# Patient Record
Sex: Male | Born: 1935 | Race: White | Hispanic: No | Smoking: Never smoker
Health system: Southern US, Community
[De-identification: ages and names within clinical notes are randomized; demographics above are authoritative.]

## PROBLEM LIST (undated history)

## (undated) DIAGNOSIS — N200 Calculus of kidney: Secondary | ICD-10-CM

## (undated) DIAGNOSIS — M8580 Other specified disorders of bone density and structure, unspecified site: Secondary | ICD-10-CM

## (undated) DIAGNOSIS — I951 Orthostatic hypotension: Secondary | ICD-10-CM

## (undated) DIAGNOSIS — I739 Peripheral vascular disease, unspecified: Secondary | ICD-10-CM

## (undated) DIAGNOSIS — J309 Allergic rhinitis, unspecified: Secondary | ICD-10-CM

## (undated) DIAGNOSIS — K649 Unspecified hemorrhoids: Secondary | ICD-10-CM

## (undated) DIAGNOSIS — K219 Gastro-esophageal reflux disease without esophagitis: Secondary | ICD-10-CM

## (undated) DIAGNOSIS — M1990 Unspecified osteoarthritis, unspecified site: Secondary | ICD-10-CM

## (undated) DIAGNOSIS — N529 Male erectile dysfunction, unspecified: Secondary | ICD-10-CM

## (undated) DIAGNOSIS — N139 Obstructive and reflux uropathy, unspecified: Secondary | ICD-10-CM

## (undated) DIAGNOSIS — K635 Polyp of colon: Secondary | ICD-10-CM

## (undated) DIAGNOSIS — F419 Anxiety disorder, unspecified: Secondary | ICD-10-CM

## (undated) DIAGNOSIS — K589 Irritable bowel syndrome without diarrhea: Secondary | ICD-10-CM

## (undated) DIAGNOSIS — E785 Hyperlipidemia, unspecified: Secondary | ICD-10-CM

## (undated) HISTORY — DX: Unspecified osteoarthritis, unspecified site: M19.90

## (undated) HISTORY — DX: Gastro-esophageal reflux disease without esophagitis: K21.9

## (undated) HISTORY — DX: Other specified disorders of bone density and structure, unspecified site: M85.80

## (undated) HISTORY — DX: Unspecified hemorrhoids: K64.9

## (undated) HISTORY — DX: Peripheral vascular disease, unspecified: I73.9

## (undated) HISTORY — DX: Irritable bowel syndrome, unspecified: K58.9

## (undated) HISTORY — DX: Orthostatic hypotension: I95.1

## (undated) HISTORY — DX: Anxiety disorder, unspecified: F41.9

## (undated) HISTORY — DX: Male erectile dysfunction, unspecified: N52.9

## (undated) HISTORY — DX: Hyperlipidemia, unspecified: E78.5

## (undated) HISTORY — DX: Calculus of kidney: N20.0

## (undated) HISTORY — DX: Obstructive and reflux uropathy, unspecified: N13.9

## (undated) HISTORY — DX: Polyp of colon: K63.5

## (undated) HISTORY — DX: Allergic rhinitis, unspecified: J30.9

---

## 2001-11-06 ENCOUNTER — Encounter: Admission: RE | Admit: 2001-11-06 | Discharge: 2001-11-06 | Payer: Self-pay | Admitting: Cardiology

## 2001-11-06 ENCOUNTER — Encounter: Payer: Self-pay | Admitting: Cardiology

## 2001-11-13 ENCOUNTER — Ambulatory Visit (HOSPITAL_COMMUNITY): Admission: RE | Admit: 2001-11-13 | Discharge: 2001-11-13 | Payer: Self-pay | Admitting: Cardiology

## 2003-11-09 HISTORY — PX: HERNIA REPAIR: SHX51

## 2004-10-02 ENCOUNTER — Ambulatory Visit: Payer: Self-pay | Admitting: Family Medicine

## 2004-11-04 ENCOUNTER — Ambulatory Visit: Payer: Self-pay | Admitting: Family Medicine

## 2004-12-03 ENCOUNTER — Ambulatory Visit: Payer: Self-pay | Admitting: Family Medicine

## 2005-09-11 ENCOUNTER — Ambulatory Visit: Payer: Self-pay | Admitting: Family Medicine

## 2005-11-08 HISTORY — PX: COLONOSCOPY: SHX174

## 2005-12-20 ENCOUNTER — Ambulatory Visit: Payer: Self-pay | Admitting: Internal Medicine

## 2006-02-24 ENCOUNTER — Ambulatory Visit: Payer: Self-pay | Admitting: Internal Medicine

## 2006-03-15 ENCOUNTER — Ambulatory Visit: Payer: Self-pay | Admitting: Family Medicine

## 2006-06-16 ENCOUNTER — Ambulatory Visit: Payer: Self-pay | Admitting: Family Medicine

## 2006-09-06 ENCOUNTER — Ambulatory Visit: Payer: Self-pay | Admitting: Family Medicine

## 2006-10-13 ENCOUNTER — Ambulatory Visit: Payer: Self-pay | Admitting: Family Medicine

## 2007-02-09 ENCOUNTER — Ambulatory Visit: Payer: Self-pay | Admitting: Family Medicine

## 2007-03-14 ENCOUNTER — Ambulatory Visit: Payer: Self-pay | Admitting: Family Medicine

## 2007-03-21 ENCOUNTER — Ambulatory Visit: Payer: Self-pay | Admitting: Family Medicine

## 2007-03-21 LAB — CONVERTED CEMR LAB
Basophils Absolute: 0 10*3/uL (ref 0.0–0.1)
Basophils Relative: 0.7 % (ref 0.0–1.0)
CO2: 30 meq/L (ref 19–32)
Creatinine, Ser: 0.6 mg/dL (ref 0.4–1.5)
Direct LDL: 144.4 mg/dL
HCT: 41.9 % (ref 39.0–52.0)
Hemoglobin: 14.6 g/dL (ref 13.0–17.0)
MCHC: 34.9 g/dL (ref 30.0–36.0)
Monocytes Absolute: 0.5 10*3/uL (ref 0.2–0.7)
Neutrophils Relative %: 53.5 % (ref 43.0–77.0)
PSA: 0.47 ng/mL (ref 0.10–4.00)
Potassium: 4.6 meq/L (ref 3.5–5.1)
RDW: 12 % (ref 11.5–14.6)
Sodium: 143 meq/L (ref 135–145)
Total Bilirubin: 0.8 mg/dL (ref 0.3–1.2)
Total CHOL/HDL Ratio: 4.6
Total Protein: 6.5 g/dL (ref 6.0–8.3)

## 2007-06-27 ENCOUNTER — Ambulatory Visit: Payer: Self-pay | Admitting: Family Medicine

## 2007-06-28 LAB — CONVERTED CEMR LAB
ALT: 32 units/L (ref 0–53)
AST: 31 units/L (ref 0–37)
Albumin: 3.7 g/dL (ref 3.5–5.2)
VLDL: 14 mg/dL (ref 0–40)

## 2007-08-27 ENCOUNTER — Encounter: Payer: Self-pay | Admitting: Family Medicine

## 2007-09-21 ENCOUNTER — Ambulatory Visit: Payer: Self-pay | Admitting: Family Medicine

## 2007-09-21 DIAGNOSIS — Z87442 Personal history of urinary calculi: Secondary | ICD-10-CM | POA: Insufficient documentation

## 2007-09-21 DIAGNOSIS — E785 Hyperlipidemia, unspecified: Secondary | ICD-10-CM | POA: Insufficient documentation

## 2007-10-12 ENCOUNTER — Ambulatory Visit: Payer: Self-pay | Admitting: Family Medicine

## 2007-11-14 ENCOUNTER — Ambulatory Visit: Payer: Self-pay | Admitting: Family Medicine

## 2007-11-27 ENCOUNTER — Ambulatory Visit: Payer: Self-pay | Admitting: Family Medicine

## 2007-11-30 ENCOUNTER — Telehealth: Payer: Self-pay | Admitting: Family Medicine

## 2007-11-30 LAB — CONVERTED CEMR LAB
Total CHOL/HDL Ratio: 3
Triglycerides: 68 mg/dL (ref 0–149)
VLDL: 14 mg/dL (ref 0–40)

## 2007-12-05 ENCOUNTER — Encounter: Payer: Self-pay | Admitting: Family Medicine

## 2008-01-17 ENCOUNTER — Ambulatory Visit: Payer: Self-pay | Admitting: Family Medicine

## 2008-01-17 DIAGNOSIS — M899 Disorder of bone, unspecified: Secondary | ICD-10-CM | POA: Insufficient documentation

## 2008-01-17 DIAGNOSIS — M949 Disorder of cartilage, unspecified: Secondary | ICD-10-CM

## 2008-02-01 LAB — CONVERTED CEMR LAB
ALT: 24 units/L (ref 0–53)
Alkaline Phosphatase: 61 units/L (ref 39–117)
Cholesterol: 138 mg/dL (ref 0–200)
LDL Cholesterol: 71 mg/dL (ref 0–99)
Total Protein: 6.3 g/dL (ref 6.0–8.3)

## 2008-02-05 LAB — CONVERTED CEMR LAB: Vit D, 1,25-Dihydroxy: 38 (ref 30–89)

## 2008-05-15 ENCOUNTER — Ambulatory Visit: Payer: Self-pay | Admitting: Family Medicine

## 2008-05-15 DIAGNOSIS — M1610 Unilateral primary osteoarthritis, unspecified hip: Secondary | ICD-10-CM | POA: Insufficient documentation

## 2008-06-13 ENCOUNTER — Ambulatory Visit: Payer: Self-pay | Admitting: Family Medicine

## 2008-06-13 DIAGNOSIS — M67919 Unspecified disorder of synovium and tendon, unspecified shoulder: Secondary | ICD-10-CM | POA: Insufficient documentation

## 2008-06-13 DIAGNOSIS — M719 Bursopathy, unspecified: Secondary | ICD-10-CM

## 2008-06-14 ENCOUNTER — Telehealth: Payer: Self-pay | Admitting: *Deleted

## 2008-07-24 ENCOUNTER — Telehealth: Payer: Self-pay | Admitting: Family Medicine

## 2008-07-24 ENCOUNTER — Ambulatory Visit: Payer: Self-pay | Admitting: Family Medicine

## 2009-04-01 ENCOUNTER — Telehealth: Payer: Self-pay | Admitting: Family Medicine

## 2009-04-22 ENCOUNTER — Ambulatory Visit: Payer: Self-pay | Admitting: Family Medicine

## 2009-04-22 ENCOUNTER — Telehealth: Payer: Self-pay | Admitting: Family Medicine

## 2009-04-22 DIAGNOSIS — R5381 Other malaise: Secondary | ICD-10-CM | POA: Insufficient documentation

## 2009-04-22 DIAGNOSIS — K219 Gastro-esophageal reflux disease without esophagitis: Secondary | ICD-10-CM | POA: Insufficient documentation

## 2009-04-22 DIAGNOSIS — R5383 Other fatigue: Secondary | ICD-10-CM

## 2009-04-22 LAB — CONVERTED CEMR LAB
Basophils Absolute: 0 10*3/uL (ref 0.0–0.1)
Basophils Relative: 0.4 % (ref 0.0–3.0)
CO2: 29 meq/L (ref 19–32)
Calcium: 9.3 mg/dL (ref 8.4–10.5)
Chloride: 106 meq/L (ref 96–112)
Creatinine, Ser: 0.9 mg/dL (ref 0.4–1.5)
Eosinophils Relative: 2.1 % (ref 0.0–5.0)
Glucose, Bld: 79 mg/dL (ref 70–99)
HCT: 40.7 % (ref 39.0–52.0)
Hemoglobin: 14.5 g/dL (ref 13.0–17.0)
LDL Cholesterol: 77 mg/dL (ref 0–99)
Lymphocytes Relative: 33.6 % (ref 12.0–46.0)
Lymphs Abs: 1.6 10*3/uL (ref 0.7–4.0)
Monocytes Relative: 10.2 % (ref 3.0–12.0)
Neutro Abs: 2.6 10*3/uL (ref 1.4–7.7)
RBC: 4.18 M/uL — ABNORMAL LOW (ref 4.22–5.81)
RDW: 12.4 % (ref 11.5–14.6)
TSH: 1.62 microintl units/mL (ref 0.35–5.50)
Total CHOL/HDL Ratio: 3
Triglycerides: 91 mg/dL (ref 0.0–149.0)
WBC: 4.8 10*3/uL (ref 4.5–10.5)

## 2009-05-21 ENCOUNTER — Ambulatory Visit: Payer: Self-pay | Admitting: Family Medicine

## 2009-05-21 ENCOUNTER — Telehealth: Payer: Self-pay | Admitting: Family Medicine

## 2009-08-05 ENCOUNTER — Encounter: Payer: Self-pay | Admitting: Family Medicine

## 2009-08-20 ENCOUNTER — Ambulatory Visit: Payer: Self-pay | Admitting: Family Medicine

## 2009-09-10 ENCOUNTER — Ambulatory Visit: Payer: Self-pay | Admitting: Family Medicine

## 2009-09-10 DIAGNOSIS — J309 Allergic rhinitis, unspecified: Secondary | ICD-10-CM | POA: Insufficient documentation

## 2009-09-10 DIAGNOSIS — R42 Dizziness and giddiness: Secondary | ICD-10-CM | POA: Insufficient documentation

## 2009-09-10 DIAGNOSIS — I739 Peripheral vascular disease, unspecified: Secondary | ICD-10-CM | POA: Insufficient documentation

## 2010-03-05 ENCOUNTER — Ambulatory Visit: Payer: Self-pay | Admitting: Family Medicine

## 2010-04-10 ENCOUNTER — Inpatient Hospital Stay (HOSPITAL_COMMUNITY): Admission: EM | Admit: 2010-04-10 | Discharge: 2010-04-11 | Payer: Self-pay | Admitting: Emergency Medicine

## 2010-04-15 ENCOUNTER — Ambulatory Visit: Payer: Self-pay | Admitting: Family Medicine

## 2010-04-15 DIAGNOSIS — T148XXA Other injury of unspecified body region, initial encounter: Secondary | ICD-10-CM | POA: Insufficient documentation

## 2010-04-15 DIAGNOSIS — K589 Irritable bowel syndrome without diarrhea: Secondary | ICD-10-CM | POA: Insufficient documentation

## 2010-04-15 DIAGNOSIS — R079 Chest pain, unspecified: Secondary | ICD-10-CM | POA: Insufficient documentation

## 2010-04-20 ENCOUNTER — Ambulatory Visit: Payer: Self-pay | Admitting: Family Medicine

## 2010-05-01 ENCOUNTER — Encounter: Payer: Self-pay | Admitting: Family Medicine

## 2010-05-17 LAB — CONVERTED CEMR LAB
OCCULT 1: NEGATIVE
OCCULT 3: NEGATIVE

## 2010-06-04 ENCOUNTER — Ambulatory Visit: Payer: Self-pay | Admitting: Family Medicine

## 2010-06-04 DIAGNOSIS — I951 Orthostatic hypotension: Secondary | ICD-10-CM | POA: Insufficient documentation

## 2010-06-04 DIAGNOSIS — F411 Generalized anxiety disorder: Secondary | ICD-10-CM | POA: Insufficient documentation

## 2010-06-18 ENCOUNTER — Ambulatory Visit: Payer: Self-pay | Admitting: Family Medicine

## 2010-06-18 DIAGNOSIS — W57XXXA Bitten or stung by nonvenomous insect and other nonvenomous arthropods, initial encounter: Secondary | ICD-10-CM

## 2010-06-18 DIAGNOSIS — T148 Other injury of unspecified body region: Secondary | ICD-10-CM | POA: Insufficient documentation

## 2010-06-18 DIAGNOSIS — R609 Edema, unspecified: Secondary | ICD-10-CM | POA: Insufficient documentation

## 2010-07-20 ENCOUNTER — Telehealth: Payer: Self-pay | Admitting: Family Medicine

## 2010-07-28 ENCOUNTER — Telehealth: Payer: Self-pay | Admitting: Family Medicine

## 2010-08-04 ENCOUNTER — Ambulatory Visit: Payer: Self-pay | Admitting: Family Medicine

## 2010-09-28 ENCOUNTER — Ambulatory Visit: Payer: Self-pay | Admitting: Family Medicine

## 2010-10-05 ENCOUNTER — Ambulatory Visit: Payer: Self-pay | Admitting: Family Medicine

## 2010-10-05 DIAGNOSIS — L570 Actinic keratosis: Secondary | ICD-10-CM | POA: Insufficient documentation

## 2010-10-13 LAB — CONVERTED CEMR LAB
ALT: 20 units/L (ref 0–53)
AST: 27 units/L (ref 0–37)
Albumin: 3.9 g/dL (ref 3.5–5.2)
Alkaline Phosphatase: 61 units/L (ref 39–117)
BUN: 15 mg/dL (ref 6–23)
Bilirubin, Direct: 0.2 mg/dL (ref 0.0–0.3)
CO2: 27 meq/L (ref 19–32)
Calcium: 9 mg/dL (ref 8.4–10.5)
Chloride: 104 meq/L (ref 96–112)
Cholesterol: 235 mg/dL — ABNORMAL HIGH (ref 0–200)
Creatinine, Ser: 0.8 mg/dL (ref 0.4–1.5)
Direct LDL: 150.4 mg/dL
GFR calc non Af Amer: 100.36 mL/min (ref >60)
Glucose, Bld: 92 mg/dL (ref 70–99)
HDL: 58.4 mg/dL (ref >39.00)
Potassium: 4.1 meq/L (ref 3.5–5.1)
Sodium: 141 meq/L (ref 135–145)
TSH: 1.25 microintl units/mL (ref 0.35–5.50)
Total Bilirubin: 0.8 mg/dL (ref 0.3–1.2)
Total CHOL/HDL Ratio: 4
Total Protein: 6.4 g/dL (ref 6.0–8.3)
Triglycerides: 68 mg/dL (ref 0.0–149.0)
VLDL: 13.6 mg/dL (ref 0.0–40.0)

## 2010-12-02 ENCOUNTER — Encounter: Payer: Self-pay | Admitting: Family Medicine

## 2010-12-10 NOTE — Assessment & Plan Note (Signed)
Summary: sneezing/not feeling like himself/cjr   Vital Signs:  Patient profile:   77 year old male Weight:      179 pounds O2 Sat:      97 % Temp:     97.6 degrees F Pulse rate:   76 / minute BP sitting:   126 / 84  (left arm)  Vitals Entered By: Pura Spice, RN (March 05, 2010 4:12 PM) CC: sneezes and coughs    History of Present Illness: This 77 year old white married male had been doing very well except for the past 4-6 weeks he is at increased sneezing with some cough which sounds more like coming from the upper chest or throat then the lungs per side there is head and nasal congestion with clear drainage no fever  blood pressure under good control no recent problem with renal calculi Kenyon Ana under good control  Allergies: No Known Drug Allergies  Past History:  Past Medical History: Last updated: 06/30/2007 Kidney stones  Social History: Last updated: 06/30/2007 Never Smoked Alcohol use-yes Drug use-no Regular exercise-no  Risk Factors: Smoking Status: never (06/30/2007)  Review of Systems      See HPI  The patient denies anorexia, fever, weight loss, weight gain, vision loss, decreased hearing, hoarseness, chest pain, syncope, dyspnea on exertion, peripheral edema, prolonged cough, headaches, hemoptysis, abdominal pain, melena, hematochezia, severe indigestion/heartburn, hematuria, incontinence, genital sores, muscle weakness, suspicious skin lesions, transient blindness, difficulty walking, depression, unusual weight change, abnormal bleeding, enlarged lymph nodes, angioedema, breast masses, and testicular masses.    Physical Exam  General:  Well-developed,well-nourished,in no acute distress; alert,appropriate and cooperative throughout examination Head:  Normocephalic and atraumatic without obvious abnormalities. No apparent alopecia or balding. Eyes:  No corneal or conjunctival inflammation noted. EOMI. Perrla. Funduscopic exam benign, without hemorrhages,  exudates or papilledema. Vision grossly normal. Ears:  External ear exam shows no significant lesions or deformities.  Otoscopic examination reveals clear canals, tympanic membranes are intact bilaterally without bulging, retraction, inflammation or discharge. Hearing is grossly normal bilaterally. Nose:  boggy nasal mucosa with clear drainage Mouth:  Oral mucosa and oropharynx without lesions or exudates.  Teeth in good repair. Lungs:  Normal respiratory effort, chest expands symmetrically. Lungs are clear to auscultation, no crackles or wheezes. Heart:  Normal rate and regular rhythm. S1 and S2 normal without gallop, murmur, click, rub or other extra sounds. Extremities:  No clubbing, cyanosis, edema, or deformity noted with normal full range of motion of all joints.     Complete Medication List: 1)  Astepro 0.15 % Soln (Azelastine hcl) .... 2 sprays each nostril morning and midafternoon 2)  Crestor 5 Mg Tabs (Rosuvastatin calcium) .Marland Kitchen.. 1 qd 3)  Zyrtec Allergy 10 Mg Tabs (Cetirizine hcl) .... One q.d. for allergy  Patient Instructions: 1)  you have allergic rhinitis in the allergies: He is today. Hard Zyrtec 10 mg each day 2)  Continue your Nexium Crestor lorazepam Prescriptions: ASTEPRO 0.15 % SOLN (AZELASTINE HCL) 2 sprays each nostril morning and midafternoon  #1 vial x 11   Entered and Authorized by:   Judithann Sheen MD   Signed by:   Judithann Sheen MD on 03/05/2010   Method used:   Print then Give to Patient   RxID:   [id number]   Appended Document: sneezing/not feeling like himself/cjr     Allergies: No Known Drug Allergies   Impression & Recommendations:  Problem # 1:  ALLERGIC RHINITIS (ICD-477.9) Assessment Deteriorated  His updated medication list  for this problem includes:    Astepro 0.15 % Soln (Azelastine hcl) .Marland Kitchen... 2 sprays each nostril morning and midafternoon    Zyrtec Allergy 10 Mg Tabs (Cetirizine hcl) ..... One q.d. for  allergy  Problem # 2:  ANXIETY STATE, UNSPECIFIED (ICD-300.00) Assessment: Improved  Problem # 3:  GERD (ICD-530.81) Assessment: Improved  Problem # 4:  HYPERLIPIDEMIA (ICD-272.4) Assessment: Improved  His updated medication list for this problem includes:    Crestor 5 Mg Tabs (Rosuvastatin calcium) .Marland Kitchen... 1 qd  Complete Medication List: 1)  Astepro 0.15 % Soln (Azelastine hcl) .... 2 sprays each nostril morning and midafternoon 2)  Crestor 5 Mg Tabs (Rosuvastatin calcium) .Marland Kitchen.. 1 qd 3)  Zyrtec Allergy 10 Mg Tabs (Cetirizine hcl) .... One q.d. for allergy

## 2010-12-10 NOTE — Assessment & Plan Note (Signed)
Summary: FUP//CCM   Vital Signs:  Patient profile:   77 year old male Height:      69 inches (175.26 cm) Weight:      174 pounds (79.09 kg) O2 Sat:      96 % on Room air Temp:     97.6 degrees F (36.44 degrees C) oral Pulse rate:   64 / minute BP sitting:   124 / 78  (left arm) Cuff size:   regular  Vitals Entered By: Josph Macho RMA (August 04, 2010 2:03 PM)  O2 Flow:  Room air CC: Follow-up visit/ CF Is Patient Diabetic? No   History of Present Illness: patient is a 77 year old male in today for reevaluation of reflux, anxiety, or edema. He is accompanied by his wife who confirms that he continues to be anxious on a daily basis. He is tremulous and nervous. At his last visit with his PMD he was started on citalopram but did not fully understand the directions. He never went to his pharmacy citalopram so he has not started it so far. He is sleeping well, he denies depression but acknowledges some difficulty with concentrating at times. His GI symptoms his dyspepsia, intermittent abdominal pain and diarrhea have all improved at this visit and he no longer feels he needs any medications for that he is not presently taking Protonix for his high dose thiamine. He is taking his probiotic and has found that to be very helpful. No fevers, no chills, chest pain, palpitations, shortness of breath, GU complaints at today's visit.  Current Medications (verified): 1)  Astepro 0.15 % Soln (Azelastine Hcl) .... 2 Sprays Each Nostril Morning and Midafternoon 2)  Crestor 5 Mg Tabs (Rosuvastatin Calcium) .Marland Kitchen.. 1 Qd 3)  Zyrtec Allergy 10 Mg Tabs (Cetirizine Hcl) .... One Q.d. For Allergy 4)  Hyoscyamine Sulfate Cr 0.375 Mg Xr12h-Tab (Hyoscyamine Sulfate) .Marland Kitchen.. 1 Am and Pm To Prvent Irritable Bowel Symptoms 5)  Align  Caps (Probiotic Product) .Marland Kitchen.. 1 Qd 6)  Fluorocortisone .1 Mg .Marland Kitchen.. 1 Before Breakfast and Supper To Prevent Drop in Blood Pressure 7)  Prednisone 20 Mg Tabs (Prednisone) .Marland Kitchen.. 1 Three  Times A Day Pc X2 Days 1bid X 3 Days Then 1 Qd  Allergies (verified): No Known Drug Allergies  Past History:  Past medical history reviewed for relevance to current acute and chronic problems. Social history (including risk factors) reviewed for relevance to current acute and chronic problems.  Past Medical History: Reviewed history from 06/30/2007 and no changes required. Kidney stones  Social History: Reviewed history from 06/30/2007 and no changes required. Never Smoked Alcohol use-yes Drug use-no Regular exercise-no  Review of Systems      See HPI       Flu Vaccine Consent Questions     Do you have a history of severe allergic reactions to this vaccine? no    Any prior history of allergic reactions to egg and/or gelatin? no    Do you have a sensitivity to the preservative Thimersol? no    Do you have a past history of Guillan-Barre Syndrome? no    Do you currently have an acute febrile illness? no    Have you ever had a severe reaction to latex? no    Vaccine information given and explained to patient? yes    Are you currently pregnant? no    Lot Number:[id number]   Exp Date:05/08/2011   Site Given  Left Deltoid IM Josph Macho RMA  August 04, 2010 2:24 PM  Physical Exam  General:  Well-developed,well-nourished,in no acute distress; alert,appropriate and cooperative throughout examination Head:  Normocephalic and atraumatic without obvious abnormalities. No apparent alopecia or balding. Mouth:  Oral mucosa and oropharynx without lesions or exudates.  Teeth in good repair. Neck:  No deformities, masses, or tenderness noted. Lungs:  Normal respiratory effort, chest expands symmetrically. Lungs are clear to auscultation, no crackles or wheezes. Heart:  Normal rate and regular rhythm. S1 and S2 normal without gallop, murmur, click, rub or other extra sounds. Abdomen:  Bowel sounds positive,abdomen soft and non-tender without masses, organomegaly or hernias  noted. Pulses:  R and L ,dorsalis pedis and posterior tibial pulses are full and equal bilaterally Extremities:  1+ left pedal edema.  no edema on right Psych:  Cognition and judgment appear intact. Alert and cooperative with normal attention span and concentration. No apparent delusions, illusions, hallucinations   Impression & Recommendations:  Problem # 1:  ANXIETY (ICD-300.00)  The following medications were removed from the medication list:    Citalopram Hydrobromide 20 Mg Tabs (Citalopram hydrobromide) .Marland Kitchen... 1 once daily to prent nervousness and weakness His updated medication list for this problem includes:    Citalopram Hydrobromide 10 Mg Tabs (Citalopram hydrobromide) .Marland Kitchen... 1 tab by mouth daily Patient never filled his citalopram last month, did not understand it was at the pharmacy will restart at just 10 mg and he is instructed to go to pharmacy to pick up. He expresses understanding  Problem # 2:  EDEMA (ICD-782.3)  His updated medication list for this problem includes:    Furosemide 40 Mg Tabs (Furosemide) .Marland Kitchen... 1 tab by mouth daily as needed edema Mild on left, encouraged him to elevate leg above heart for 15 minutes two times a day each day, report worsening symptoms  Problem # 3:  HYPOTENSION, ORTHOSTATIC (ICD-458.0) Improved but requesting a doctor's note to release him from his obligation at his gym, this is filled out today  Problem # 4:  IRRITABLE BOWEL SYNDROME (ICD-564.1) Symptoms improved, no dyspepsia at this time, encouraged a daily probiotic  Complete Medication List: 1)  Astepro 0.15 % Soln (Azelastine hcl) .... 2 sprays each nostril morning and midafternoon 2)  Crestor 5 Mg Tabs (Rosuvastatin calcium) .Marland Kitchen.. 1 qd 3)  Zyrtec Allergy 10 Mg Tabs (Cetirizine hcl) .... One q.d. for allergy 4)  Align Caps (Probiotic product) .Marland Kitchen.. 1 qd 5)  Fluorocortisone .1 Mg  .Marland KitchenMarland Kitchen. 1 before breakfast and supper to prevent drop in blood pressure 6)  Citalopram Hydrobromide 10 Mg  Tabs (Citalopram hydrobromide) .Marland Kitchen.. 1 tab by mouth daily 7)  Furosemide 40 Mg Tabs (Furosemide) .Marland Kitchen.. 1 tab by mouth daily as needed edema  Other Orders: Flu Vaccine 20yrs + MEDICARE PATIENTS (Z6109) Administration Flu vaccine - MCR (U0454)  Patient Instructions: 1)  Please schedule a follow-up appointment in 2 months.  2)  BMP prior to visit, ICD-9: 456.0 3)  Hepatic Panel prior to visit ICD-9: 992.5 4)  Lipid panel prior to visit ICD-9 : 272.4 5)  TSH prior to visit ICD-9 : 458.0 Prescriptions: CITALOPRAM HYDROBROMIDE 10 MG TABS (CITALOPRAM HYDROBROMIDE) 1 tab by mouth daily  #30 x 1   Entered and Authorized by:   Danise Edge MD   Signed by:   Danise Edge MD on 08/04/2010   Method used:   [facility]ron[facility] to        [facility] [phone][facility] (retail)       [address]       [place]  [place], [place]  [address]       Ph: [phone]       Fax: [phone]   RxID:   [id number]

## 2010-12-10 NOTE — Assessment & Plan Note (Signed)
Summary: 2 MONTH ROV/NJR   Vital Signs:  Patient profile:   77 year old male Height:      69 inches (175.26 cm) Weight:      171.75 pounds (78.07 kg) O2 Sat:      95 % on Room air Temp:     97.9 degrees F (36.61 degrees C) oral Pulse rate:   65 / minute BP sitting:   130 / 78  (left arm) Cuff size:   regular  Vitals Entered By: Josph Macho RMA (October 05, 2010 2:35 PM)  O2 Flow:  Room air CC: 2 month follow up/ CF Is Patient Diabetic? No   History of Present Illness: Patient is a 77 yo caucasian male in today for follow up. He has a couple of concerns. He is struggling with some right flank pain off and on for the past week. He had been doing some yard work with twisting and lifting prior to the pain starting. It has been going on for about 1 week and occurs only with certain movements and then resolves with rest and change in position. No SOB/CP/cough/GI or GU c/o. No recent illness/f/c. He is also noting he stopped the Citalopram because he did not feel it was helping and he thought it made his pruritus worse, he still has pruritus but feels it is better since stopping the med about 10 days ago. No rash is obvious except for a scaly patch on his scalp just behind his left ear which he has had for a very long time, at least months. He is also complaining of persistent PND and scratchy throat lately.  Current Medications (verified): 1)  Astepro 0.15 % Soln (Azelastine Hcl) .... 2 Sprays Each Nostril Morning and Midafternoon 2)  Crestor 5 Mg Tabs (Rosuvastatin Calcium) .Marland Kitchen.. 1 Qd 3)  Zyrtec Allergy 10 Mg Tabs (Cetirizine Hcl) .... One Q.d. For Allergy 4)  Align  Caps (Probiotic Product) .Marland Kitchen.. 1 Qd 5)  Fluorocortisone .1 Mg .Marland Kitchen.. 1 Before Breakfast and Supper To Prevent Drop in Blood Pressure 6)  Citalopram Hydrobromide 10 Mg Tabs (Citalopram Hydrobromide) .Marland Kitchen.. 1 Tab By Mouth Daily 7)  Furosemide 40 Mg Tabs (Furosemide) .Marland Kitchen.. 1 Tab By Mouth Daily As Needed Edema  Allergies  (verified): No Known Drug Allergies  Past History:  Past medical history reviewed for relevance to current acute and chronic problems. Social history (including risk factors) reviewed for relevance to current acute and chronic problems.  Past Medical History: Reviewed history from 06/30/2007 and no changes required. Kidney stones  Social History: Reviewed history from 06/30/2007 and no changes required. Never Smoked Alcohol use-yes Drug use-no Regular exercise-no  Review of Systems      See HPI  Physical Exam  General:  Well-developed,well-nourished,in no acute distress; alert,appropriate and cooperative throughout examination Head:  Normocephalic and atraumatic without obvious abnormalities. No apparent alopecia or balding. Ears:  External ear exam shows no significant lesions or deformities.  Otoscopic examination reveals clear canals, tympanic membranes are intact bilaterally without bulging, retraction, inflammation or discharge. Hearing is grossly normal bilaterally. Mouth:  Oral mucosa and oropharynx without lesions or exudates.  Teeth in good repair. Neck:  No deformities, masses, or tenderness noted. Lungs:  Normal respiratory effort, chest expands symmetrically. Lungs are clear to auscultation, no crackles or wheezes. Heart:  Normal rate and regular rhythm. S1 and S2 normal without gallop, murmur, click, rub or other extra sounds. Abdomen:  Bowel sounds positive,abdomen soft and non-tender without masses, organomegaly or hernias noted. Extremities:  1+ left pedal edema and 1+ right pedal edema.  No c/c Skin:  behond left ear scaly, erythematous patch, slightly raised, roughly 1-2 cm in diameter Psych:  slightly anxious.     Impression & Recommendations:  Problem # 1:  ACTINIC KERATOSIS, HEAD (ICD-702.0)  Orders: Dermatology Referral (Derma) Referred to Dr Darin Engels whom he has seen before. May try Elocon cream as needed for the pruritus til seen  Problem # 2:   EDEMA (ICD-782.3)  His updated medication list for this problem includes:    Furosemide 40 Mg Tabs (Furosemide) .Marland Kitchen... 1 tab by mouth daily as needed edema Did not take his Furosemide today,  Problem # 3:  ALLERGIC RHINITIS (ICD-477.9)  His updated medication list for this problem includes:    Astepro 0.15 % Soln (Azelastine hcl) .Marland Kitchen... 2 sprays each nostril morning and midafternoon    Zyrtec Allergy 10 Mg Tabs (Cetirizine hcl) ..... One tab by mouth two times a day as needed congestion and some pruritus try to increase Zyrtec and apply some Sarna as needed, maintain adequate hydration  Problem # 4:  ANXIETY (ICD-300.00)  The following medications were removed from the medication list:    Citalopram Hydrobromide 10 Mg Tabs (Citalopram hydrobromide) .Marland Kitchen... 1 tab by mouth daily Patient chose to stop the Citalopram he felt it made the pruritus worse, does not want to restart  Problem # 5:  HYPERLIPIDEMIA (ICD-272.4)  His updated medication list for this problem includes:    Crestor 5 Mg Tabs (Rosuvastatin calcium) .Marland Kitchen... 1 qd Agrees to restart the Crestor and discuss with PMD  Problem # 6:  GERD (ICD-530.81) No c/o today  Complete Medication List: 1)  Astepro 0.15 % Soln (Azelastine hcl) .... 2 sprays each nostril morning and midafternoon 2)  Crestor 5 Mg Tabs (Rosuvastatin calcium) .Marland Kitchen.. 1 qd 3)  Zyrtec Allergy 10 Mg Tabs (Cetirizine hcl) .... One tab by mouth two times a day as needed congestion 4)  Align Caps (Probiotic product) .Marland Kitchen.. 1 qd 5)  Fluorocortisone .1 Mg  .Marland KitchenMarland Kitchen. 1 before breakfast and supper to prevent drop in blood pressure 6)  Furosemide 40 Mg Tabs (Furosemide) .Marland Kitchen.. 1 tab by mouth daily as needed edema 7)  Elocon 0.1 % Crea (Mometasone furoate) .... Apply small amount to itchy lesion on scalp two times a day as needed 8)  Celebrex 200 Mg Caps (Celecoxib) .Marland Kitchen.. 1 tab by mouth daily as needed pain 9)  Sarna 0.5-0.5 % Lotn (Camphor-menthol) .... Apply to itchy, dry skin up to  three x daily  Patient Instructions: 1)  Most patients (90%) with low back pain will improve with time ( 2-6 weeks). Keep active but avoid activities that are painful. Apply moist heat and/or ice to lower back several times a day.  2)  Take 500- 1000 mg of tylenol every 6 hours as needed for relief of pain or comfort of fever. Avoid taking more than 3000 mg in a 24 hour period( can cause liver damage in higher doses).  3)  May alternate Tylenol with the Celebrex as needed for back pain 4)  May apply moist heat and Aspercreme as needed aswell for pain 5)  Make appt with PMD, Dr Scotty Court in 1-2 months or as needed Prescriptions: ELOCON 0.1 % CREA (MOMETASONE FUROATE) apply small amount to itchy lesion on scalp two times a day as needed  #60gm x 1   Entered and Authorized by:   Danise Edge MD   Signed by:   Danise Edge MD  on 10/05/2010   Method used:   [facility] to        [facility] [phone][facility] (retail[facility]       [address]       [place], [place]  [address]       Ph: [phone]       Fax: [phone]   RxID:   [id number]    Orders Added: 1)  Dermatology Referral [Derma] 2)  Est. Patient Level IV [01093]

## 2010-12-10 NOTE — Assessment & Plan Note (Signed)
Summary: 2 weeks fup//ccm   Vital Signs:  Patient profile:   77 year old male Height:      69 inches Weight:      177 pounds BMI:     26.23 O2 Sat:      94 % Temp:     97.7 degrees F Pulse rate:   74 / minute Pulse rhythm:   regular BP sitting:   140 / 80  (left arm)  Vitals Entered By: Pura Spice, RN (June 18, 2010 1:59 PM) CC: rov ck left lower leg ? bug bite   History of Present Illness: this 77 year old white married male he is in today as a followup his previous posterolateral hypertension and vertigo it has been much improved since starting fluorocortisone, continue same treatment His other complaint is that of bug bites over the left lower leg ulcers some bites on the right lower leg which may be Chigger bites Relates he is year-old bowel syndrome is under much better control  Allergies (verified): No Known Drug Allergies  Past History:  Past Medical History: Last updated: 06/30/2007 Kidney stones  Social History: Last updated: 06/30/2007 Never Smoked Alcohol use-yes Drug use-no Regular exercise-no  Risk Factors: Smoking Status: never (06/30/2007)  Review of Systems      See HPI  The patient denies anorexia, fever, weight loss, weight gain, vision loss, decreased hearing, hoarseness, chest pain, syncope, dyspnea on exertion, peripheral edema, prolonged cough, headaches, hemoptysis, abdominal pain, melena, hematochezia, severe indigestion/heartburn, hematuria, incontinence, genital sores, muscle weakness, suspicious skin lesions, transient blindness, difficulty walking, depression, unusual weight change, abnormal bleeding, enlarged lymph nodes, angioedema, breast masses, and testicular masses.    Physical Exam  General:  Well-developed,well-nourished,in no acute distress; alert,appropriate and cooperative throughout examination Lungs:  Normal respiratory effort, chest expands symmetrically. Lungs are clear to auscultation, no crackles or wheezes. Heart:   Normal rate and regular rhythm. S1 and S2 normal without gallop, murmur, click, rub or other extra sounds. Abdomen:  Bowel sounds positive,abdomen soft and non-tender without masses, organomegaly or hernias noted. Extremities:  left pretibial edema and right pretibial edema.   Skin:  erythematous cystic type lesions multiple in her left and right lower leg   Impression & Recommendations:  Problem # 1:  INSECT BITE (ICD-919.4) Assessment New  prednisone 20 mg decrease in dosage  Orders: Prescription Created Electronically [phone])  Problem # 2:  HYPOTENSION, ORTHOSTATIC (ICD-458.0) Assessment: Improved Flurocortisone.1 mgbid  Problem # 3:  IRRITABLE BOWEL SYNDROME (ICD-564.1) Assessment: Improved  Problem # 4:  ANXIETY (ICD-300.00) Assessment: Improved  His updated medication list for this problem includes:    Citalopram Hydrobromide 20 Mg Tabs (Citalopram hydrobromide) .Marland Kitchen... 1 once daily to prent nervousness and weakness  Problem # 5:  EDEMA (ICD-782.3) Assessment: New furosemide 40 mg q.a.m. with orange juice or banana  Complete Medication List: 1)  Astepro 0.15 % Soln (Azelastine hcl) .... 2 sprays each nostril morning and midafternoon 2)  Crestor 5 Mg Tabs (Rosuvastatin calcium) .Marland Kitchen.. 1 qd 3)  Zyrtec Allergy 10 Mg Tabs (Cetirizine hcl) .... One q.d. for allergy 4)  Protonix 40 Mg Tbec (Pantoprazole sodium) .Marland Kitchen.. 1 once daily 5)  Hyoscyamine Sulfate Cr 0.375 Mg Xr12h-tab (Hyoscyamine sulfate) .Marland Kitchen.. 1 am and pm to prvent irritable bowel symptoms 6)  Align Caps (Probiotic product) .Marland Kitchen.. 1 qd 7)  Citalopram Hydrobromide 20 Mg Tabs (Citalopram hydrobromide) .Marland Kitchen.. 1 once daily to prent nervousness and weakness 8)  Fluorocortisone .1 Mg  .Marland KitchenMarland Kitchen. 1 before breakfast and supper  to prevent drop in blood pressure 9)  Prednisone 20 Mg Tabs (Prednisone) .Marland Kitchen.. 1 three times a day pc x2 days 1bid x 3 days then 1 qd  Patient Instructions: 1)  refilled hyoscyamine for irritable bowel 2)  take 1  Furosemide 40 mg each day as long as needed for edema 3)  prednisone decreasing dosage for insect bites 4)  Pleased that dizziness is resolved Prescriptions: PREDNISONE 20 MG TABS (PREDNISONE) 1 three times a day pc x2 days 1bid x 3 days then 1 qd  #18 x 0   Entered and Authorized by:   Judithann Sheen MD   Signed by:   Judithann Sheen MD on 06/18/2010   Method used:   [facility] to        [facility] [phone][facility] (retail[facility]       [address]       [place], [place]  [address]       Ph: [phone]       Fax: [phone]   RxID:   [id number] HYOSCYAMINE SULFATE CR 0.375 MG XR12H-TAB (HYOSCYAMINE SULFATE) 1 AM and PM to prvent irritable bowel symptoms  #180 x 3   Entered and Authorized by:   Judithann Sheen MD   Signed by:   Judithann Sheen MD on 06/18/2010   Method used:   Print then Give to Patient   RxID:   [id number]

## 2010-12-10 NOTE — Letter (Signed)
Summary: Verification of Disability/[facility]  Verification of Disability/[facility]   Imported By: Maryln Gottron 08/06/2010 12:27:36  _____________________________________________________________________  External Attachment:    Type:   Image     Comment:   External Document

## 2010-12-10 NOTE — Assessment & Plan Note (Signed)
Summary: pt will come in fasting/njr   Vital Signs:  Patient profile:   77 year old male Height:      67 inches Weight:      174 pounds O2 Sat:      90 % Temp:     97.6 degrees F Pulse rate:   70 / minute BP sitting:   120 / 80  (left arm)  Vitals Entered By: Pura Spice, RN (April 15, 2010 10:42 AM) CC: was seen friday at [facility] ER and discharged Saturday per pt "heart ok"  go over problems labs    History of Present Illness: This 77 year old white male was seen urgent care on June 3 complaining of episodes of diaphoresis and epigastric pain and suggestive chest pain. Here his EKG revealed suggestion of inferior ST elevation and was referred to [facility] room for violation of possible MI CT scan of the chest was negative only abnormalities were troponin x2 Was felt he should have catheterization and this carried out revealing normal study patient was kept overnight and was discharged on June 4 and is in no pain or problem since that time he is to be followed up with Dr. Clarene Duke his cardiologist as well as myself He has no complaints today except for large bruise over his abdomen and right inguinal region. He is somewhat upset about this Hasn't normal catheterization in 2003 patient has had some problem with the bowel syndrome in the past and that hasn't some symptoms prior to this episode of chest pain, episode of diarrhea periodically  Allergies (verified): No Known Drug Allergies  Past History:  Past Medical History: Last updated: 06/30/2007 Kidney stones  Risk Factors: Exercise: no (06/30/2007)  Risk Factors: Smoking Status: never (06/30/2007)  Review of Systems      See HPI  The patient denies anorexia, fever, weight loss, weight gain, vision loss, decreased hearing, hoarseness, chest pain, syncope, dyspnea on exertion, peripheral edema, prolonged cough, headaches, hemoptysis, abdominal pain, melena, hematochezia, severe indigestion/heartburn, hematuria,  incontinence, genital sores, muscle weakness, suspicious skin lesions, transient blindness, difficulty walking, depression, unusual weight change, abnormal bleeding, enlarged lymph nodes, angioedema, breast masses, and testicular masses.    Physical Exam  General:  Well-developed,well-nourished,in no acute distress; alert,appropriate and cooperative throughout examination Chest Wall:  No deformities, masses, tenderness or gynecomastia noted. Breasts:  No masses or gynecomastia noted Lungs:  Normal respiratory effort, chest expands symmetrically. Lungs are clear to auscultation, no crackles or wheezes. Heart:  Normal rate and regular rhythm. S1 and S2 normal without gallop, murmur, click, rub or other extra sounds. Abdomen:  10 x 20 cm purpuric hematoma right inguinal region somewhat tender him of pulses good   Impression & Recommendations:  Problem # 1:  CHEST PAIN, ACUTE (ICD-786.50) Assessment Improved normal catheterization  Problem # 2:  IRRITABLE BOWEL SYNDROME (ICD-564.1) Assessment: New  Hyoscamin .375  bid  Orders: Prescription Created Electronically [phone])  Problem # 3:  GERD (ICD-530.81) Assessment: Unchanged  His updated medication list for this problem includes:    Protonix 40 Mg Tbec (Pantoprazole sodium) .Marland Kitchen... 1 once daily    Hyoscyamine Sulfate Cr 0.375 Mg Xr12h-tab (Hyoscyamine sulfate) .Marland Kitchen... 1 am and pm to prvent irritable bowel symptoms  Problem # 4:  HYPERLIPIDEMIA (ICD-272.4) Assessment: Unchanged  His updated medication list for this problem includes:    Crestor 5 Mg Tabs (Rosuvastatin calcium) .Marland Kitchen... 1 once daily prescribed earlier but unable to take Crestor due to muscle aches  Complete Medication List:  1)  Astepro 0.15 % Soln (Azelastine hcl) .... 2 sprays each nostril morning and midafternoon 2)  Crestor 5 Mg Tabs (Rosuvastatin calcium) .Marland Kitchen.. 1 qd 3)  Zyrtec Allergy 10 Mg Tabs (Cetirizine hcl) .... One q.d. for allergy 4)  Protonix 40 Mg Tbec  (Pantoprazole sodium) .Marland Kitchen.. 1 once daily 5)  Hyoscyamine Sulfate Cr 0.375 Mg Xr12h-tab (Hyoscyamine sulfate) .Marland Kitchen.. 1 am and pm to prvent irritable bowel symptoms 6)  Align Caps (Probiotic product) .Marland Kitchen.. 1 qd  Patient Instructions: 1)  my impression is that you're doing her well from the chest pain and pleased that you have no positive cardiac findings 2)  We'll treat the ear wall bowel syndrome 3)  Call with any changes 4)  a flat heat to hematoma in the right inguinal region for 20-30 minutes 3 times daily Prescriptions: HYOSCYAMINE SULFATE CR 0.375 MG XR12H-TAB (HYOSCYAMINE SULFATE) 1 AM and PM to prvent irritable bowel symptoms  #60 x 5   Entered and Authorized by:   Judithann Sheen MD   Signed by:   Judithann Sheen MD on 04/15/2010   Method used:   [facility]        [facility] [phone][facility] (retail)       [address]       [place], [place]  [address]       Ph: [phone]       Fax: [phone]   RxID:   [id number]

## 2010-12-10 NOTE — Progress Notes (Signed)
Summary: REFILL REQUEST  Phone Note Refill Request Message from:  Patient on July 28, 2010 8:35 AM  Refills Requested: Medication #1:  FLUOROCORTISONE .1 MG 1 before breakfast and supper to prevent drop in blood pressure   Notes: [facility]  Medication #2:  HYOSCYAMINE SULFATE CR 0.375 MG XR12H-TAB 1 AM and PM to prvent irritable bowel symptoms   Notes: [facility]  Pt called to ck on status of refills...Marland KitchenMarland KitchenMarland Kitchen Pt states she adv that Rx's were supposed to be sent to [facility].   Initial call taken by: Debbra Riding,  July 28, 2010 8:37 AM    Prescriptions: FLUOROCORTISONE .1 MG 1 before breakfast and supper to prevent drop in blood pressure  #60 x 6   Entered by:   Josph Macho RMA   Authorized by:   Danise Edge MD   Signed by:   Josph Macho RMA on 07/28/2010   Method used:   Faxed to ...       [facility] (mail-order)             , [place]         Ph: [phone]       Fax: [phone]   RxID:   [id number] HYOSCYAMINE SULFATE CR 0.375 MG XR12H-TAB (HYOSCYAMINE SULFATE) 1 AM and PM to prvent irritable bowel symptoms  #180 x 3   Entered by:   Josph Macho RMA   Authorized by:   Danise Edge MD   Signed by:   Josph Macho RMA on 07/28/2010   Method used:   Faxed to ...       [facility] (mail-order)             , [place]         Ph: [phone]       Fax: [phone]   RxID:   [id number]  Informed pt

## 2010-12-10 NOTE — Assessment & Plan Note (Signed)
Summary: gi issues//ccm   Vital Signs:  Patient profile:   77 year old male Weight:      174 pounds O2 Sat:      95 % Temp:     98.5 degrees F Pulse rate:   75 / minute Pulse rhythm:   regular BP sitting:   112 / 70  (left arm)  Vitals Entered By: Pura Spice, RN (June 04, 2010 12:58 PM) CC: c/o nervous stomach and shaky, states align helped but he ran out and has not bought any more of it.    History of Present Illness: This 77 year old white married male relates he is having some upper abdominal discomfort number shaky in nervous has had some upper zones of diarrhea which have been controlled with #1 and he was doing better and taken aAlighn. His primary reason for the visit today is the fact he has had positional vertigo when he stands rapidly he feels very dizzy and has fallen one occasion. This is a well known for the past 2 weeks the patient states it's increased  in severity  Allergies: No Known Drug Allergies  Past History:  Past Medical History: Last updated: 06/30/2007 Kidney stones  Social History: Last updated: 06/30/2007 Never Smoked Alcohol use-yes Drug use-no Regular exercise-no  Risk Factors: Smoking Status: never (06/30/2007)  Review of Systems      See HPI  The patient denies anorexia, fever, weight loss, weight gain, vision loss, decreased hearing, hoarseness, chest pain, syncope, dyspnea on exertion, peripheral edema, prolonged cough, headaches, hemoptysis, abdominal pain, melena, hematochezia, severe indigestion/heartburn, hematuria, incontinence, genital sores, muscle weakness, suspicious skin lesions, transient blindness, difficulty walking, depression, unusual weight change, abnormal bleeding, enlarged lymph nodes, angioedema, breast masses, and testicular masses.    Physical Exam  General:  Well-developed,well-nourished,in no acute distress; alert,appropriate and cooperative throughout examination Head:  Normocephalic and atraumatic without  obvious abnormalities. No apparent alopecia or balding. Eyes:  No corneal or conjunctival inflammation noted. EOMI. Perrla. Funduscopic exam benign, without hemorrhages, exudates or papilledema. Vision grossly normal. no nystagmus Ears:  External ear exam shows no significant lesions or deformities.  Otoscopic examination reveals clear canals, tympanic membranes are intact bilaterally without bulging, retraction, inflammation or discharge. Hearing is grossly normal bilaterally. Nose:  External nasal examination shows no deformity or inflammation. Nasal mucosa are pink and moist without lesions or exudates. Mouth:  Oral mucosa and oropharynx without lesions or exudates.  Teeth in good repair. Neck:  No deformities, masses, or tenderness noted. Lungs:  Normal respiratory effort, chest expands symmetrically. Lungs are clear to auscultation, no crackles or wheezes. Heart:  Normal rate and regular rhythm. S1 and S2 normal without gallop, murmur, click, rub or other extra sounds.  blood pressure sitting 120/90 then after standing blood pressure dropped to 108/70, repeated x2 Extremities:  No clubbing, cyanosis, edema, or deformity noted with normal full range of motion of all joints.     Impression & Recommendations:  Problem # 1:  HYPOTENSION, ORTHOSTATIC (ICD-458.0) Assessment New  flurocortisone .1 mg bid  Orders: Prescription Created Electronically [phone])  Problem # 2:  IRRITABLE BOWEL SYNDROME (ICD-564.1) Assessment: Improved align hyoscamine  Problem # 3:  ANXIETY (ICD-300.00) Assessment: New  His updated medication list for this problem includes:    Citalopram Hydrobromide 20 Mg Tabs (Citalopram hydrobromide) .Marland Kitchen... 1 once daily to prent nervousness and weakness  Problem # 4:  DIZZINESS (ICD-780.4) Assessment: Deteriorated  His updated medication list for this problem includes:    Zyrtec Allergy  10 Mg Tabs (Cetirizine hcl) ..... One q.d. for allergy  Complete Medication  List: 1)  Astepro 0.15 % Soln (Azelastine hcl) .... 2 sprays each nostril morning and midafternoon 2)  Crestor 5 Mg Tabs (Rosuvastatin calcium) .Marland Kitchen.. 1 qd 3)  Zyrtec Allergy 10 Mg Tabs (Cetirizine hcl) .... One q.d. for allergy 4)  Protonix 40 Mg Tbec (Pantoprazole sodium) .Marland Kitchen.. 1 once daily 5)  Hyoscyamine Sulfate Cr 0.375 Mg Xr12h-tab (Hyoscyamine sulfate) .Marland Kitchen.. 1 am and pm to prvent irritable bowel symptoms 6)  Align Caps (Probiotic product) .Marland Kitchen.. 1 qd 7)  Citalopram Hydrobromide 20 Mg Tabs (Citalopram hydrobromide) .Marland Kitchen.. 1 once daily to prent nervousness and weakness 8)  Fluorocortisone .1 Mg  .Marland KitchenMarland Kitchen. 1 before breakfast and supper to prevent drop in blood pressure  Patient Instructions: 1)  Please make appt for Derry Skill in 2 weeks also appt for Loretta 2)  her problem is orthostatic hypotension which means when he changes position blood and is causing her dizziness and will prescribe a medication which will help raise your blood pressure slightly help prevent this appeared another way that she can help this problem of dizziness on change of position is to stand and min weight with dizziness subsides before 3)  Return in 2 week 4)  Citalopram should help her anxiety Prescriptions: FLUOROCORTISONE .1 MG 1 before breakfast and supper to prevent drop in blood pressure  #60 x 6   Entered and Authorized by:   Judithann Sheen MD   Signed by:   Judithann Sheen MD on 06/04/2010   Method used:   Print then Give to Patient   RxID:   [id number] CITALOPRAM HYDROBROMIDE 20 MG TABS (CITALOPRAM HYDROBROMIDE) 1 once daily to prent nervousness and weakness  #30 x 6   Entered and Authorized by:   Judithann Sheen MD   Signed by:   Judithann Sheen MD on 06/04/2010   Method used:   Electronically to        [facility] [phone][facility] (retail)       [address]       [place], [place]  [address]       Ph: [phone]       Fax: [phone]   RxID:    [id number]

## 2010-12-10 NOTE — Progress Notes (Signed)
Summary: REFILLS  Phone Note Refill Request Message from:  SPOUSE---LIVE CALL  Refills Requested: Medication #1:  HYOSCYAMINE SULFATE CR 0.375 MG XR12H-TAB 1 AM and PM to prvent irritable bowel symptoms  Medication #2:  FLUOROCORTISONE .1 MG 1 before breakfast and supper to prevent drop in blood pressure SEND TO [facility]  Initial call taken by: Warnell Forester,  July 20, 2010 10:04 AM    Prescriptions: FLUOROCORTISONE .1 MG 1 before breakfast and supper to prevent drop in blood pressure  #60 x 6   Entered by:   Josph Macho RMA   Authorized by:   Danise Edge MD   Signed by:   Josph Macho RMA on 07/20/2010   Method used:   Telephoned to ...       [facility] [phone]* (retail)       [address]       [place], [place]  [address]       Ph: [phone]       Fax: [phone]   RxID:   [id number] HYOSCYAMINE SULFATE CR 0.375 MG XR12H-TAB (HYOSCYAMINE SULFATE) 1 AM and PM to prvent irritable bowel symptoms  #180 x 3   Entered by:   Josph Macho RMA   Authorized by:   Danise Edge MD   Signed by:   Josph Macho RMA on 07/20/2010   Method used:   Electronically to        [facility] [phone]* (retail)       [address]       [place], [place]  [address]       Ph: [phone]       Fax: [phone]   RxID:   [id number]

## 2010-12-10 NOTE — Letter (Signed)
Summary: [facility]   Imported By: Maryln Gottron 05/12/2010 13:36:10  _____________________________________________________________________  External Attachment:    Type:   Image     Comment:   External Document

## 2010-12-15 ENCOUNTER — Ambulatory Visit (INDEPENDENT_AMBULATORY_CARE_PROVIDER_SITE_OTHER): Payer: Medicare Other | Admitting: Family Medicine

## 2010-12-15 VITALS — BP 126/74 | HR 74 | Temp 97.4°F | Wt 174.0 lb

## 2010-12-15 DIAGNOSIS — E785 Hyperlipidemia, unspecified: Secondary | ICD-10-CM

## 2010-12-15 DIAGNOSIS — M129 Arthropathy, unspecified: Secondary | ICD-10-CM

## 2010-12-15 DIAGNOSIS — K589 Irritable bowel syndrome without diarrhea: Secondary | ICD-10-CM

## 2010-12-15 DIAGNOSIS — M1990 Unspecified osteoarthritis, unspecified site: Secondary | ICD-10-CM

## 2010-12-15 MED ORDER — ROSUVASTATIN CALCIUM 5 MG PO TABS: 5.0000 mg | ORAL_TABLET | Freq: Every day | ORAL | Status: DC

## 2010-12-15 NOTE — Patient Instructions (Addendum)
Irritable bowel syndrome under cood control, continue hyoscyamine each morning Refilled crestor Chest is clear, continue celebrex for art Return 3 monthshritis

## 2010-12-16 NOTE — Progress Notes (Signed)
  Subjective:    Patient ID: Bill Ginger., male    DOB: 10/20/1929, 77 y.o.   MRN: [id number]  HPIThis 77 year old white married male is in for a followup visit pertaining to his doctor with irritable bowel syndrome. He relates he's been doing her well and much better since he has been taking Levbid b.i.d. He desired needed exam in his shoulders but to give him trouble in the past. He relates his arthritis is under much the control taken Celebrex 200 mg b.i.d. States he is now active, very busy with his hobby of woodworking. His previous problem with hypertension has been much better and he has not had any falls recently   Review of Systems  Constitutional:       C. History of present illness  All other systems reviewed and are negative.       Objective:   Physical Exampatient is a well-developed well-nourished male who is cooperative and alert HEENT negative heart normal size regular will no murmurs Lungs clear to palpation percussion and auscultation Abdomen liver spleen kidneys nonpalpable normal bowel sounds no masses Examination shoulders reveal range of motion good no pain        Assessment & Plan:  Patient is doing her well with all his medical problems. Irritable bowel syndrome is under good control with Levbid . Arthritis is controlled much better with Celebrex. Instructed to continue his regular medications as prescribed and mentioned of GERD under good control

## 2011-01-25 LAB — PROTIME-INR: Prothrombin Time: 14.1 seconds (ref 11.6–15.2)

## 2011-01-25 LAB — COMPREHENSIVE METABOLIC PANEL
ALT: 19 U/L (ref 0–53)
Alkaline Phosphatase: 56 U/L (ref 39–117)
BUN: 14 mg/dL (ref 6–23)
CO2: 25 mEq/L (ref 19–32)
Chloride: 105 mEq/L (ref 96–112)
GFR calc non Af Amer: >60 mL/min (ref >60)
Glucose, Bld: 110 mg/dL — ABNORMAL HIGH (ref 70–99)
Potassium: 3.4 mEq/L — ABNORMAL LOW (ref 3.5–5.1)
Sodium: 137 mEq/L (ref 135–145)
Total Bilirubin: 0.9 mg/dL (ref 0.3–1.2)
Total Protein: 6.6 g/dL (ref 6.0–8.3)

## 2011-01-25 LAB — DIFFERENTIAL
Basophils Absolute: 0 10*3/uL (ref 0.0–0.1)
Basophils Relative: 0 % (ref 0–1)
Eosinophils Absolute: 0 10*3/uL (ref 0.0–0.7)
Monocytes Relative: 13 % — ABNORMAL HIGH (ref 3–12)
Neutro Abs: 5.5 10*3/uL (ref 1.7–7.7)
Neutrophils Relative %: 71 % (ref 43–77)

## 2011-01-25 LAB — CBC
HCT: 38.2 % — ABNORMAL LOW (ref 39.0–52.0)
HCT: 38.5 % — ABNORMAL LOW (ref 39.0–52.0)
Hemoglobin: 13.4 g/dL (ref 13.0–17.0)
Platelets: 113 10*3/uL — ABNORMAL LOW (ref 150–400)
Platelets: 90 10*3/uL — ABNORMAL LOW (ref 150–400)
RBC: 3.84 MIL/uL — ABNORMAL LOW (ref 4.22–5.81)
RDW: 12.8 % (ref 11.5–15.5)
RDW: 12.8 % (ref 11.5–15.5)
WBC: 5.6 10*3/uL (ref 4.0–10.5)
WBC: 7.9 10*3/uL (ref 4.0–10.5)

## 2011-01-25 LAB — CARDIAC PANEL(CRET KIN+CKTOT+MB+TROPI)
CK, MB: 0.6 ng/mL (ref 0.3–4.0)
CK, MB: 3.2 ng/mL (ref 0.3–4.0)
Relative Index: INVALID (ref 0.0–2.5)
Relative Index: INVALID (ref 0.0–2.5)
Total CK: 60 U/L (ref 7–232)
Troponin I: 0.01 ng/mL (ref 0.00–0.06)
Troponin I: 0.37 ng/mL — ABNORMAL HIGH (ref 0.00–0.06)

## 2011-01-25 LAB — BASIC METABOLIC PANEL
CO2: 23 mEq/L (ref 19–32)
Glucose, Bld: 107 mg/dL — ABNORMAL HIGH (ref 70–99)
Potassium: 3.8 mEq/L (ref 3.5–5.1)
Sodium: 136 mEq/L (ref 135–145)

## 2011-01-25 LAB — APTT: aPTT: 156 seconds — ABNORMAL HIGH (ref 24–37)

## 2011-01-25 LAB — POCT I-STAT, CHEM 8
Creatinine, Ser: 0.9 mg/dL (ref 0.4–1.5)
Glucose, Bld: 133 mg/dL — ABNORMAL HIGH (ref 70–99)
HCT: 39 % (ref 39.0–52.0)
Hemoglobin: 13.3 g/dL (ref 13.0–17.0)
Sodium: 140 mEq/L (ref 135–145)
TCO2: 25 mmol/L (ref 0–100)

## 2011-01-25 LAB — HEPARIN LEVEL (UNFRACTIONATED): Heparin Unfractionated: 0.81 IU/mL — ABNORMAL HIGH (ref 0.30–0.70)

## 2011-01-25 LAB — HEPATIC FUNCTION PANEL
Albumin: 3.5 g/dL (ref 3.5–5.2)
Alkaline Phosphatase: 55 U/L (ref 39–117)
Indirect Bilirubin: 0.8 mg/dL (ref 0.3–0.9)
Total Protein: 6.3 g/dL (ref 6.0–8.3)

## 2011-01-25 LAB — TROPONIN I: Troponin I: 0.45 ng/mL — ABNORMAL HIGH (ref 0.00–0.06)

## 2011-01-25 LAB — LIPASE, BLOOD: Lipase: 21 U/L (ref 11–59)

## 2011-01-25 LAB — POCT CARDIAC MARKERS: Myoglobin, poc: 56.6 ng/mL (ref 12–200)

## 2011-01-25 LAB — LIPID PANEL
HDL: 54 mg/dL (ref >39)
Total CHOL/HDL Ratio: 3 RATIO
Triglycerides: 55 mg/dL (ref <150)
VLDL: 11 mg/dL (ref 0–40)

## 2011-01-25 LAB — TSH: TSH: 0.706 u[IU]/mL (ref 0.350–4.500)

## 2011-03-16 ENCOUNTER — Encounter: Payer: Self-pay | Admitting: Family Medicine

## 2011-03-16 ENCOUNTER — Ambulatory Visit (INDEPENDENT_AMBULATORY_CARE_PROVIDER_SITE_OTHER): Payer: Medicare Other | Admitting: Family Medicine

## 2011-03-16 VITALS — BP 124/80 | HR 69 | Temp 97.7°F | Wt 173.0 lb

## 2011-03-16 DIAGNOSIS — M1990 Unspecified osteoarthritis, unspecified site: Secondary | ICD-10-CM

## 2011-03-16 DIAGNOSIS — I10 Essential (primary) hypertension: Secondary | ICD-10-CM

## 2011-03-16 DIAGNOSIS — S336XXA Sprain of sacroiliac joint, initial encounter: Secondary | ICD-10-CM

## 2011-03-16 DIAGNOSIS — K589 Irritable bowel syndrome without diarrhea: Secondary | ICD-10-CM

## 2011-03-16 DIAGNOSIS — E785 Hyperlipidemia, unspecified: Secondary | ICD-10-CM

## 2011-03-16 DIAGNOSIS — L259 Unspecified contact dermatitis, unspecified cause: Secondary | ICD-10-CM

## 2011-03-16 DIAGNOSIS — S39012A Strain of muscle, fascia and tendon of lower back, initial encounter: Secondary | ICD-10-CM

## 2011-03-16 DIAGNOSIS — M129 Arthropathy, unspecified: Secondary | ICD-10-CM

## 2011-03-16 MED ORDER — HYDROXYZINE HCL 25 MG PO TABS: 25.0000 mg | ORAL_TABLET | Freq: Three times a day (TID) | ORAL | Status: AC | PRN

## 2011-03-16 NOTE — Patient Instructions (Signed)
You have sacriiliac strain  And inflammation, recommend taking celebresx 200 mg twice daily For rash apply lotrisone twice daily, take hydroxyzine tab  3  Times daily to get rid of rash as well as stopping itching, luke warm water when you shower

## 2011-03-19 ENCOUNTER — Encounter: Payer: Self-pay | Admitting: Family Medicine

## 2011-03-19 NOTE — Progress Notes (Signed)
  Subjective:    Patient ID: Bill Price., male    DOB: 04/13/1929, 77 y.o.   MRN: [id number] This 77 year old white married male it in today complaining of a rash over his lower extremities after working in the garden has been itching and erythematous he also complains of strain in his back on the right side and into the sacroiliac region blood pressure has been well he had no problem with his right shoulder and no problem with Orthostatic hypotension as in the past he relates he has continued to take his Crestor as prescribed and uses furosemide for edema when needed he continues to take Celebrex 2 mg b.i.d. For arthritis he has no other complaints,irritable bowel syndrome under control HPI    Review of Systemssee history of present illness     Objective:   Physical Exam the patient is well-developed well-nourished white male who appeared in no distress turgor. Next mild Heart no evidence cardiomegaly heart sounds but without murmurs Lungs clear to palpation percussion and auscultation Abdomen liver spleen kidneys are nonpalpable bowel sounds normal Extremities no edema I were erythematous slight fascicular rash over both lower extremities compatible with contact dermatitis       Assessment & Plan:  Hypertension controlled no change here we'll bowel syndrome under control no change SI strain right tube continue Celebrex 2 00 mg b.i,d. Contact dermatitis steroid cream b.i.d. Arthritis continue as above Celebrex 200 mg b.i.d. Hyperlipidemia continue Crestor 5 mg

## 2011-03-26 NOTE — Cardiovascular Report (Signed)
[place]  Patient:    SHLOMA, ROGGENKAMP Visit Number: [id number] MRN: [id number]          Service Type: CAT Location: [facility] 2899 11 Attending Physician:  Loreli Dollar Dictated by:   Julieanne Manson, M.D. Proc. Date: 11/13/01 Admit Date:  11/13/2001   CC:         Cardiac Catheterization Laboratory  Lacretia Leigh. Quintella Reichert, M.D.   Cardiac Catheterization  PROCEDURE:  Cardiac catheterization.  CARDIOLOGIST:  Julieanne Manson, M.D.  INDICATIONS:  Mr. Furness is a 77 year old male who had a prolonged episode of upper respiratory infection, associated with shortness of breath.  He underwent a nuclear study on October 25, 2001, at [facility], showing a 67% ejection fraction, inferolateral ischemia that extended down to the apex of the heart.  He had a 67% ejection fraction. Because of this he was brought in for an outpatient cardiac catheterization.  DESCRIPTION OF PROCEDURE:  The patient was prepped and draped in the usual sterile fashion, exposing the right groin.  Following a local anesthetic of 1% Xylocaine, the Seldinger technique was employed.  A 5-French introducer sheath was placed into the right femoral artery.  Selective left and right coronary arteriography and ventriculography in the RAO projection was performed.  COMPLICATIONS:  None.  EQUIPMENT:  The 5-French Judkins configuration catheters.  RESULTS HEMODYNAMIC MONITORING Central aortic pressure:  127/73. Left ventricular pressure:  127/21.  There was no aortic valve gradient noted at the time of pullback.  VENTRICULOGRAPHY:  The ventriculography in the RAO projection revealed normal left ventricular systolic function.  No mitral regurgitation.  Questionable mild MVP.  There was ventricular ectopy.  The end diastolic pressure was 22.  CORONARY ARTERIOGRAPHY:  No calcification on fluoroscopy was noted.  RESULTS 1. Left main coronary artery:  The left main  coronary artery is normal. 2. Left anterior descending coronary artery:  The LAD extended down to the    apex of the heart.  It gave rise to one hybrid large first diagonal    branch.  This entire system was free of disease. 3. Circumflex coronary artery:  The circumflex gave rise to one large    obtuse marginal vessel that was free of disease. 4. Right coronary artery:  The right coronary artery was a large dominant    vessel with a PDA and two large posterolateral branches.  This system    was free of disease.  CONCLUSION 1. Normal left ventricular systolic function. 2. No evidence of epicardial coronary artery disease.  DISCUSSION:  It appears that the stress test was a false positive study.  The patient will be discharged to home today, and followed up in my office tomorrow. Dictated by:   Julieanne Manson, M.D. Attending Physician:  Loreli Dollar DD:  11/13/01 TD:  11/13/01 Job: [id number]

## 2011-06-10 ENCOUNTER — Ambulatory Visit (INDEPENDENT_AMBULATORY_CARE_PROVIDER_SITE_OTHER): Payer: Medicare Other | Admitting: Family Medicine

## 2011-06-10 ENCOUNTER — Encounter: Payer: Self-pay | Admitting: Family Medicine

## 2011-06-10 VITALS — BP 100/62 | HR 74 | Temp 97.7°F | Wt 175.0 lb

## 2011-06-10 DIAGNOSIS — M1990 Unspecified osteoarthritis, unspecified site: Secondary | ICD-10-CM

## 2011-06-10 DIAGNOSIS — J310 Chronic rhinitis: Secondary | ICD-10-CM

## 2011-06-10 DIAGNOSIS — K589 Irritable bowel syndrome without diarrhea: Secondary | ICD-10-CM

## 2011-06-10 DIAGNOSIS — H9190 Unspecified hearing loss, unspecified ear: Secondary | ICD-10-CM

## 2011-06-10 DIAGNOSIS — M129 Arthropathy, unspecified: Secondary | ICD-10-CM

## 2011-06-10 DIAGNOSIS — L259 Unspecified contact dermatitis, unspecified cause: Secondary | ICD-10-CM

## 2011-06-10 MED ORDER — PREDNISONE 10 MG PO TABS: ORAL_TABLET | ORAL | Status: DC

## 2011-06-10 MED ORDER — TRIAMCINOLONE ACETONIDE 0.1 % EX CREA: TOPICAL_CREAM | Freq: Two times a day (BID) | CUTANEOUS | Status: DC

## 2011-06-21 NOTE — Patient Instructions (Signed)
Since the rash on the lower extremities improved but then reoccurs am going to treat with prednisone 10 mg tabs decreasing dosage Recommend check on new hearing aids Continue regular medications as prescribed

## 2011-06-21 NOTE — Progress Notes (Signed)
  Subjective:    Patient ID: Bill Price., male    DOB: [birth year], 77 y.o.   MRN: [id number] This 77 year old white male with known deafness is in today to followup from his previous problem of contact dermatitis as well as joint pains In general he feels he is doing much better but continues to have some problem with the rash over the lower extremities. Irritable bowel syndrome has  been under much better control. Arthritis control with Celebrex 200  mg each day, has complaints at this time. System review reveals no more complaints or problems HPI    Review of Systems see history of present illness    Objective:   Physical Exam the patient is a well-developed well-nourished white male in no distress HEENT reveals boggy pale nasal mucosa with clear drainage and congestion Lungs and heart no abnormalities noted Abdomen liver spleen kidneys are nonpalpable no tenderness bowel sounds normal Extremities shoulders not tender increased movement without pain        Assessment & Plan:  Contact dermatitis treat with prednisone 10 mg tab decreasing dosage Arthritis continue Celebrex 2 mg each day after finishing prednisone Irritable bowel syndrome continue probiotic as well as Levbid Deafness recommend checking on new hearing aids

## 2011-08-12 ENCOUNTER — Ambulatory Visit: Payer: Medicare Other | Admitting: Family Medicine

## 2011-08-26 ENCOUNTER — Ambulatory Visit (INDEPENDENT_AMBULATORY_CARE_PROVIDER_SITE_OTHER): Payer: Medicare Other

## 2011-08-26 DIAGNOSIS — Z23 Encounter for immunization: Secondary | ICD-10-CM

## 2011-09-01 ENCOUNTER — Ambulatory Visit: Payer: Medicare Other | Admitting: Family Medicine

## 2011-09-16 ENCOUNTER — Ambulatory Visit (INDEPENDENT_AMBULATORY_CARE_PROVIDER_SITE_OTHER): Payer: Medicare Other | Admitting: Family Medicine

## 2011-09-16 ENCOUNTER — Encounter: Payer: Self-pay | Admitting: Family Medicine

## 2011-09-16 DIAGNOSIS — R5381 Other malaise: Secondary | ICD-10-CM

## 2011-09-16 DIAGNOSIS — E785 Hyperlipidemia, unspecified: Secondary | ICD-10-CM

## 2011-09-16 DIAGNOSIS — M549 Dorsalgia, unspecified: Secondary | ICD-10-CM | POA: Insufficient documentation

## 2011-09-16 DIAGNOSIS — R5383 Other fatigue: Secondary | ICD-10-CM | POA: Insufficient documentation

## 2011-09-16 LAB — CBC WITH DIFFERENTIAL/PLATELET
Basophils Relative: 0.5 % (ref 0.0–3.0)
Eosinophils Absolute: 0.2 10*3/uL (ref 0.0–0.7)
Eosinophils Relative: 3 % (ref 0.0–5.0)
HCT: 41.4 % (ref 39.0–52.0)
Hemoglobin: 14 g/dL (ref 13.0–17.0)
Lymphs Abs: 2.3 10*3/uL (ref 0.7–4.0)
MCHC: 33.8 g/dL (ref 30.0–36.0)
MCV: 99.9 fl (ref 78.0–100.0)
Monocytes Absolute: 0.6 10*3/uL (ref 0.1–1.0)
Neutro Abs: 3.4 10*3/uL (ref 1.4–7.7)
Neutrophils Relative %: 52.2 % (ref 43.0–77.0)
RBC: 4.14 Mil/uL — ABNORMAL LOW (ref 4.22–5.81)
WBC: 6.6 10*3/uL (ref 4.5–10.5)

## 2011-09-16 LAB — HEPATIC FUNCTION PANEL
AST: 28 U/L (ref 0–37)
Albumin: 4.1 g/dL (ref 3.5–5.2)
Alkaline Phosphatase: 64 U/L (ref 39–117)

## 2011-09-16 LAB — BASIC METABOLIC PANEL
BUN: 15 mg/dL (ref 6–23)
Calcium: 9.3 mg/dL (ref 8.4–10.5)
Creatinine, Ser: 0.8 mg/dL (ref 0.4–1.5)
GFR: 100.11 mL/min (ref >60.00)
Glucose, Bld: 95 mg/dL (ref 70–99)

## 2011-09-16 LAB — LIPID PANEL
Cholesterol: 149 mg/dL (ref 0–200)
VLDL: 19 mg/dL (ref 0.0–40.0)

## 2011-09-16 NOTE — Progress Notes (Signed)
  Subjective:    Patient ID: Bill Price., male    DOB: January 16, 1929, 77 y.o.   MRN: [id number]  HPI New to establish.  Previous MD- Dr Scotty Court.  [facility]  Fatigue- pt reports 1-2 weeks of fatigue, feels he is ready to 'nod off at any moment'.  Reports when he gains weight (over 175lbs) he feels similarly- 'i have all my life'.  Intermittent cough, no fevers.  No N/V/D.  Sleeping well at night.  Back pain- sxs started a couple of months ago, intermittent.  Reports he has been working in the garage and doing a lot of turning and lifting.  Pain is L lower back in paraspinal region.  Hyperlipidemia- chronic problem, on Crestor 'a couple years'.  Labs last checked in November 2011.  Denies abd pain, N/V, myalgias.   Review of Systems For ROS see HPI     Objective:   Physical Exam  Constitutional: He is oriented to person, place, and time. He appears well-developed and well-nourished. No distress.  HENT:  Head: Normocephalic and atraumatic.  Eyes: Conjunctivae and EOM are normal. Pupils are equal, round, and reactive to light.  Neck: Normal range of motion. Neck supple. No thyromegaly present.  Cardiovascular: Normal rate, regular rhythm, normal heart sounds and intact distal pulses.   No murmur heard. Pulmonary/Chest: Effort normal and breath sounds normal. No respiratory distress.  Abdominal: Soft. Bowel sounds are normal. He exhibits no distension.  Musculoskeletal: He exhibits tenderness (mild TTP over L lumbar paraspinal muscle.  good flexion, extension.  some pain w/ lateral rotation.  (-) SLR bilaterally). He exhibits no edema.  Lymphadenopathy:    He has no cervical adenopathy.  Neurological: He is alert and oriented to person, place, and time. No cranial nerve deficit.  Skin: Skin is warm and dry.  Psychiatric: He has a normal mood and affect. His behavior is normal.          Assessment & Plan:

## 2011-09-16 NOTE — Patient Instructions (Signed)
Schedule your complete physical at your convenience- you can eat because we're doing your labs today We'll notify you of your lab results Take the Celebrex daily for 5-7 days for the back pain (this is a muscle spasm) Use a heating pad for pain relief Call with any questions or concerns Welcome!  We're glad to have you!

## 2011-09-17 ENCOUNTER — Encounter: Payer: Self-pay | Admitting: *Deleted

## 2011-09-19 NOTE — Assessment & Plan Note (Signed)
Chronic problem.  Tolerating statin w/out difficulty.  Check labs.  Adjust meds prn  

## 2011-09-19 NOTE — Assessment & Plan Note (Signed)
Pt reports sxs are mild but 'i just wanted to get checked'.  R/o anemia, electrolyte d/o, infxn w/ labs.  May be related to increased activity recently or as pt thinks, weight gain.  Will follow.

## 2011-09-19 NOTE — Assessment & Plan Note (Signed)
Muscular.  Likely paraspinal strain/sprain w/ recent increased garage activity.  Reviewed supportive care and red flags that should prompt return.  Pt expressed understanding and is in agreement w/ plan.

## 2011-10-25 ENCOUNTER — Ambulatory Visit: Payer: Medicare Other | Admitting: Internal Medicine

## 2011-10-26 ENCOUNTER — Encounter: Payer: Self-pay | Admitting: Internal Medicine

## 2011-10-26 ENCOUNTER — Ambulatory Visit (INDEPENDENT_AMBULATORY_CARE_PROVIDER_SITE_OTHER): Payer: Medicare Other | Admitting: Internal Medicine

## 2011-10-26 DIAGNOSIS — E785 Hyperlipidemia, unspecified: Secondary | ICD-10-CM

## 2011-10-26 DIAGNOSIS — F411 Generalized anxiety disorder: Secondary | ICD-10-CM

## 2011-10-26 DIAGNOSIS — I739 Peripheral vascular disease, unspecified: Secondary | ICD-10-CM

## 2011-10-26 DIAGNOSIS — M1610 Unilateral primary osteoarthritis, unspecified hip: Secondary | ICD-10-CM

## 2011-10-26 DIAGNOSIS — Z87442 Personal history of urinary calculi: Secondary | ICD-10-CM

## 2011-10-26 MED ORDER — AZELASTINE HCL 0.15 % NA SOLN: 1.0000 | Freq: Two times a day (BID) | NASAL | Status: DC

## 2011-10-26 MED ORDER — CELECOXIB 200 MG PO CAPS: 200.0000 mg | ORAL_CAPSULE | Freq: Every day | ORAL | Status: DC | PRN

## 2011-10-26 MED ORDER — ROSUVASTATIN CALCIUM 5 MG PO TABS: 5.0000 mg | ORAL_TABLET | Freq: Every day | ORAL | Status: DC

## 2011-10-26 NOTE — Patient Instructions (Signed)
Limit your sodium (Salt) intake    It is important that you exercise regularly, at least 20 minutes 3 to 4 times per week.  If you develop chest pain or shortness of breath seek  medical attention.  Return in 6 months for follow-up  

## 2011-10-26 NOTE — Progress Notes (Signed)
Subjective:    Patient ID: Bill Price., male    DOB: 1929-02-28, 77 y.o.   MRN: [id number]  HPI  77 year old patient who is seen today to establish with my practice. He has a history of peripheral vascular disease and dyslipidemia he is on Crestor 5 mg daily. He has osteoarthritis and a history of nephrolithiasis. He has mild anxiety dyslipidemia and a history of IBS. Doing quite well today  Past Medical History  Diagnosis Date  . Kidney stone   . Hyperlipidemia     History   Social History  . Marital Status: Married    Spouse Name: N/A    Number of Children: N/A  . Years of Education: N/A   Occupational History  . Not on file.   Social History Main Topics  . Smoking status: Never Smoker   . Smokeless tobacco: Never Used  . Alcohol Use: Yes  . Drug Use: No  . Sexually Active: No   Other Topics Concern  . Not on file   Social History Narrative  . No narrative on file    No past surgical history on file.  No family history on file.  No Known Allergies  Current Outpatient Prescriptions on File Prior to Visit  Medication Sig Dispense Refill  . rosuvastatin (CRESTOR) 5 MG tablet Take 1 tablet (5 mg total) by mouth daily.  180 tablet  3  . Azelastine HCl (ASTEPRO) 0.15 % SOLN by Nasal route 2 (two) times daily.        . camphor-menthol (SARNA) lotion Apply topically 3 (three) times daily as needed.       . celecoxib (CELEBREX) 200 MG capsule Take 200 mg by mouth daily as needed.        . cetirizine (ZYRTEC) 10 MG tablet Take 10 mg by mouth 2 (two) times daily as needed.        . mometasone (ELOCON) 0.1 % cream Apply topically 2 (two) times daily as needed.          BP 124/80  Temp(Src) 97.5 F (36.4 C) (Oral)  Wt 182 lb (82.555 kg)       Review of Systems  Constitutional: Negative for fever, chills, appetite change and fatigue.  HENT: Negative for hearing loss, ear pain, congestion, sore throat, trouble swallowing, neck stiffness, dental problem,  voice change and tinnitus.   Eyes: Negative for pain, discharge and visual disturbance.  Respiratory: Negative for cough, chest tightness, wheezing and stridor.   Cardiovascular: Negative for chest pain, palpitations and leg swelling.  Gastrointestinal: Negative for nausea, vomiting, abdominal pain, diarrhea, constipation, blood in stool and abdominal distention.  Genitourinary: Negative for urgency, hematuria, flank pain, discharge, difficulty urinating and genital sores.  Musculoskeletal: Positive for back pain. Negative for myalgias, joint swelling, arthralgias and gait problem.  Skin: Negative for rash.  Neurological: Negative for dizziness, syncope, speech difficulty, weakness, numbness and headaches.  Hematological: Negative for adenopathy. Does not bruise/bleed easily.  Psychiatric/Behavioral: Negative for behavioral problems and dysphoric mood. The patient is not nervous/anxious.        Objective:   Physical Exam  Constitutional: He is oriented to person, place, and time. He appears well-developed.  HENT:  Head: Normocephalic.  Right Ear: External ear normal.  Left Ear: External ear normal.  Eyes: Conjunctivae and EOM are normal.  Neck: Normal range of motion.  Cardiovascular: Normal rate and normal heart sounds.        The right dorsalis pedis pulse full other pedal pulses  not easily palpable  Pulmonary/Chest: Breath sounds normal.  Abdominal: Bowel sounds are normal.  Musculoskeletal: Normal range of motion. He exhibits no edema and no tenderness.  Neurological: He is alert and oriented to person, place, and time.  Psychiatric: He has a normal mood and affect. His behavior is normal.          Assessment & Plan:   Peripheral vascular disease asymptomatic Osteoarthritis. We'll continue Celebrex when necessary Dyslipidemia. Crestor refilled  annual exam in 6 months

## 2011-11-03 ENCOUNTER — Encounter: Payer: Medicare Other | Admitting: Family Medicine

## 2011-11-16 ENCOUNTER — Other Ambulatory Visit: Payer: Self-pay | Admitting: Internal Medicine

## 2011-11-16 DIAGNOSIS — E785 Hyperlipidemia, unspecified: Secondary | ICD-10-CM

## 2011-11-16 MED ORDER — ROSUVASTATIN CALCIUM 5 MG PO TABS: 5.0000 mg | ORAL_TABLET | Freq: Every day | ORAL | Status: DC

## 2011-11-16 NOTE — Telephone Encounter (Signed)
Need new rx Crestor 5 mg sent [facility] mail order. Thanks. Wants Kim to call her when done.

## 2011-12-01 ENCOUNTER — Ambulatory Visit (INDEPENDENT_AMBULATORY_CARE_PROVIDER_SITE_OTHER): Payer: Medicare Other | Admitting: Internal Medicine

## 2011-12-01 ENCOUNTER — Encounter: Payer: Self-pay | Admitting: Internal Medicine

## 2011-12-01 DIAGNOSIS — M549 Dorsalgia, unspecified: Secondary | ICD-10-CM | POA: Diagnosis not present

## 2011-12-01 DIAGNOSIS — J309 Allergic rhinitis, unspecified: Secondary | ICD-10-CM | POA: Diagnosis not present

## 2011-12-01 DIAGNOSIS — J069 Acute upper respiratory infection, unspecified: Secondary | ICD-10-CM

## 2011-12-01 NOTE — Progress Notes (Signed)
  Subjective:    Patient ID: Bill Price., male    DOB: 20-Sep-1929, 78 y.o.   MRN: [id number]  HPI  78 year old patient who has a history of allergic rhinitis. He presents with a two-day history of some increasing rhinorrhea and congestion. He states he feels improved today he has been on maintenance and Zyrtec and  Astepro.  He also complains of some very minor left lumbar back discomfort. He has had some back  issues in the past;  he does take Celebrex as needed.  Review of Systems  Constitutional: Negative for fever, chills, appetite change and fatigue.  HENT: Positive for congestion and rhinorrhea. Negative for hearing loss, ear pain, sore throat, trouble swallowing, neck stiffness, dental problem, voice change and tinnitus.   Eyes: Negative for pain, discharge and visual disturbance.  Respiratory: Negative for cough, chest tightness, wheezing and stridor.   Cardiovascular: Negative for chest pain, palpitations and leg swelling.  Gastrointestinal: Negative for nausea, vomiting, abdominal pain, diarrhea, constipation, blood in stool and abdominal distention.  Genitourinary: Negative for urgency, hematuria, flank pain, discharge, difficulty urinating and genital sores.  Musculoskeletal: Positive for back pain. Negative for myalgias, joint swelling, arthralgias and gait problem.  Skin: Negative for rash.  Neurological: Negative for dizziness, syncope, speech difficulty, weakness, numbness and headaches.  Hematological: Negative for adenopathy. Does not bruise/bleed easily.  Psychiatric/Behavioral: Negative for behavioral problems and dysphoric mood. The patient is not nervous/anxious.        Objective:   Physical Exam  Constitutional: He is oriented to person, place, and time. He appears well-developed.  HENT:  Head: Normocephalic.  Right Ear: External ear normal.  Left Ear: External ear normal.  Eyes: Conjunctivae and EOM are normal.  Neck: Normal range of motion.    Cardiovascular: Normal rate and normal heart sounds.   Pulmonary/Chest: Breath sounds normal.  Abdominal: Bowel sounds are normal.  Musculoskeletal: Normal range of motion. He exhibits no edema and no tenderness.  Neurological: He is alert and oriented to person, place, and time.  Psychiatric: He has a normal mood and affect. His behavior is normal.          Assessment & Plan:     resolving URI. Will continue symptomatic treatment with Zyrtec Low back pain. Has not been taking any anti-inflammatories he has used both Aleve and Celebrex in the past. He will call if unimproved and start anti-inflammatories  Recheck 6 months

## 2011-12-01 NOTE — Patient Instructions (Signed)
Most patients with low back pain will improve with time over the next two to 6 weeks.  Keep active but avoid any activities that cause pain.  Apply moist heat to the low back area several times daily.   Take Aleve 200 mg twice daily for pain or swelling   Return in 6 months for follow-up

## 2011-12-24 DIAGNOSIS — N529 Male erectile dysfunction, unspecified: Secondary | ICD-10-CM | POA: Diagnosis not present

## 2011-12-24 DIAGNOSIS — N139 Obstructive and reflux uropathy, unspecified: Secondary | ICD-10-CM | POA: Diagnosis not present

## 2012-04-06 ENCOUNTER — Ambulatory Visit (INDEPENDENT_AMBULATORY_CARE_PROVIDER_SITE_OTHER): Payer: Medicare Other | Admitting: Family Medicine

## 2012-04-06 VITALS — BP 127/83 | HR 64 | Temp 97.6°F | Resp 24 | Ht 67.5 in | Wt 176.4 lb

## 2012-04-06 DIAGNOSIS — M461 Sacroiliitis, not elsewhere classified: Secondary | ICD-10-CM

## 2012-04-06 DIAGNOSIS — R21 Rash and other nonspecific skin eruption: Secondary | ICD-10-CM | POA: Diagnosis not present

## 2012-04-06 MED ORDER — METHYLPREDNISOLONE 4 MG PO TABS: ORAL_TABLET | ORAL | Status: DC

## 2012-04-06 NOTE — Progress Notes (Signed)
78 yo man with several months of left low back pain, worse when rolling over in bed.  NKI. No prior problems, no sciatica, no fever, no urinary problems  O:  Positive left knee crossover Abdomen:  Normal SLR:  negative  Inspection of the back shows no significant bony abnormalities. He's nontender in the SI joint.  Assessment: I still think the problem is in the SI joint. The fact that when he rotates his pelvis he gets sharp pain highly indicative of this.  Plan: Medrol taper, patient to let me know if this does not significantly improve situation in 48-72 hours.

## 2012-04-13 ENCOUNTER — Ambulatory Visit: Payer: Self-pay | Admitting: Family Medicine

## 2012-04-13 NOTE — Progress Notes (Signed)
78 yo man with several months of left low back pain, worse when rolling over in bed. NKI.  No prior problems, no sciatica, no fever, no urinary problems   Patient did not show for appointmenyt

## 2012-04-20 ENCOUNTER — Ambulatory Visit: Payer: Self-pay | Admitting: Family Medicine

## 2012-06-01 ENCOUNTER — Ambulatory Visit: Payer: Medicare Other | Admitting: Internal Medicine

## 2012-08-03 ENCOUNTER — Encounter: Payer: Self-pay | Admitting: Internal Medicine

## 2012-08-08 ENCOUNTER — Encounter: Payer: Self-pay | Admitting: Internal Medicine

## 2012-08-08 ENCOUNTER — Ambulatory Visit (INDEPENDENT_AMBULATORY_CARE_PROVIDER_SITE_OTHER): Payer: Medicare Other | Admitting: Family Medicine

## 2012-08-08 VITALS — BP 112/58 | HR 70 | Temp 97.7°F | Resp 16

## 2012-08-08 DIAGNOSIS — Z23 Encounter for immunization: Secondary | ICD-10-CM | POA: Diagnosis not present

## 2012-08-08 NOTE — Progress Notes (Signed)
Desires flu shot. 

## 2012-09-04 ENCOUNTER — Ambulatory Visit: Payer: Medicare Other | Admitting: Internal Medicine

## 2012-09-06 ENCOUNTER — Ambulatory Visit: Payer: Medicare Other | Admitting: Internal Medicine

## 2012-12-27 DIAGNOSIS — H0230 Blepharochalasis unspecified eye, unspecified eyelid: Secondary | ICD-10-CM | POA: Diagnosis not present

## 2012-12-27 DIAGNOSIS — H04129 Dry eye syndrome of unspecified lacrimal gland: Secondary | ICD-10-CM | POA: Diagnosis not present

## 2012-12-27 DIAGNOSIS — Z961 Presence of intraocular lens: Secondary | ICD-10-CM | POA: Diagnosis not present

## 2012-12-27 DIAGNOSIS — H1045 Other chronic allergic conjunctivitis: Secondary | ICD-10-CM | POA: Diagnosis not present

## 2013-01-01 DIAGNOSIS — N529 Male erectile dysfunction, unspecified: Secondary | ICD-10-CM | POA: Diagnosis not present

## 2013-01-01 DIAGNOSIS — N139 Obstructive and reflux uropathy, unspecified: Secondary | ICD-10-CM | POA: Diagnosis not present

## 2013-06-21 ENCOUNTER — Ambulatory Visit (INDEPENDENT_AMBULATORY_CARE_PROVIDER_SITE_OTHER): Payer: Medicare Other | Admitting: Family Medicine

## 2013-06-21 ENCOUNTER — Encounter: Payer: Self-pay | Admitting: Family Medicine

## 2013-06-21 VITALS — BP 110/79 | HR 61 | Temp 97.4°F | Resp 16 | Ht 67.0 in | Wt 176.0 lb

## 2013-06-21 DIAGNOSIS — M722 Plantar fascial fibromatosis: Secondary | ICD-10-CM | POA: Diagnosis not present

## 2013-06-21 DIAGNOSIS — G8929 Other chronic pain: Secondary | ICD-10-CM

## 2013-06-21 DIAGNOSIS — M25559 Pain in unspecified hip: Secondary | ICD-10-CM | POA: Diagnosis not present

## 2013-06-21 DIAGNOSIS — M25552 Pain in left hip: Secondary | ICD-10-CM

## 2013-06-21 MED ORDER — METHYLPREDNISOLONE 4 MG PO TABS: ORAL_TABLET | ORAL | Status: DC

## 2013-06-21 MED ORDER — METHYLPREDNISOLONE ACETATE 80 MG/ML IJ SUSP
80.0000 mg | Freq: Once | INTRAMUSCULAR | Status: AC
  Administered 2013-06-21: 80 mg via INTRA_ARTICULAR

## 2013-06-21 NOTE — Progress Notes (Signed)
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  The heel pain has persisted for 2 years and the other heel has already been injected with good relief.  The left posterior hip has also waxed and waned over years.  Objective:  NAD Tender mid plantar heel on left  Sterile prep with betadine, tender area identified and injected with xylo !% and depomedrol 80 mg  Hip: good ROM, nontender  Assessment:  Plantar fasciitis and recurrent posterior superior hip pain  Plan Hip pain, chronic, left - Plan: methylPREDNISolone (MEDROL) 4 MG tablet  Plantar fasciitis of left foot - Plan: methylPREDNISolone acetate (DEPO-MEDROL) injection 80 mg  Signed, Elvina Sidle, MD

## 2013-08-16 ENCOUNTER — Ambulatory Visit (INDEPENDENT_AMBULATORY_CARE_PROVIDER_SITE_OTHER): Payer: Medicare Other | Admitting: Family Medicine

## 2013-08-16 DIAGNOSIS — Z23 Encounter for immunization: Secondary | ICD-10-CM | POA: Diagnosis not present

## 2013-12-06 ENCOUNTER — Encounter: Payer: Self-pay | Admitting: Family Medicine

## 2013-12-06 ENCOUNTER — Ambulatory Visit (INDEPENDENT_AMBULATORY_CARE_PROVIDER_SITE_OTHER): Payer: Medicare Other | Admitting: Family Medicine

## 2013-12-06 VITALS — BP 110/74 | HR 70 | Temp 97.7°F | Resp 16 | Ht 67.5 in | Wt 174.0 lb

## 2013-12-06 DIAGNOSIS — J309 Allergic rhinitis, unspecified: Secondary | ICD-10-CM

## 2013-12-06 DIAGNOSIS — M461 Sacroiliitis, not elsewhere classified: Secondary | ICD-10-CM | POA: Diagnosis not present

## 2013-12-06 MED ORDER — FLUTICASONE PROPIONATE 50 MCG/ACT NA SUSP: 2.0000 | Freq: Every day | NASAL | Status: DC

## 2013-12-06 MED ORDER — METHYLPREDNISOLONE ACETATE 80 MG/ML IJ SUSP
80.0000 mg | Freq: Once | INTRAMUSCULAR | Status: AC
  Administered 2013-12-06: 80 mg via INTRAMUSCULAR

## 2013-12-06 NOTE — Progress Notes (Signed)
Subjective:  This chart was scribed for Elvina SidleKurt Kensley Lares, MD by Carl Bestelina Holson, Medical Scribe. This patient was seen in Room 25 and the patient's care was started at 10:54 AM.   Patient ID: Bill GingerFredrick F Denley Jr., male    DOB: 1929/04/15, 80 y.o.   MRN: [id number]  HPI HPI Comments: Bill GingerFredrick F Norgaard Jr. is a 80 y.o. male with a history of Hyperlipidemia who presents to the [facility] complaining of intermittent, sore, left hip pain that will sometimes radiate down his left leg.  He states that he will notice it in the morning when he wakes up but after he starts moving the pain will subside.  He states that laying on his left hip aggravates the pain.  The patient states that he does not walk very often.    The patient is also complaining of recurring sinus congestion.  He states that the nasal spray he has is not helping his symptoms.    He is also complaining of intermittent left sided inguinal spasms that will last for about 30 seconds.  The patient states that he has received a colonoscopy.  He states that he has a history of hernia repair.   Past Medical History  Diagnosis Date  . Kidney stone   . Hyperlipidemia   . Hemorrhoids    No past surgical history on file. No family history on file. History   Social History  . Marital Status: Married    Spouse Name: N/A    Number of Children: N/A  . Years of Education: N/A   Occupational History  . Not on file.   Social History Main Topics  . Smoking status: Never Smoker   . Smokeless tobacco: Never Used  . Alcohol Use: Yes  . Drug Use: No  . Sexual Activity: No   Other Topics Concern  . Not on file   Social History Narrative  . No narrative on file   Allergies  Allergen Reactions  . Crestor [Rosuvastatin]     Shaky, hot flashes     Review of Systems  HENT: Positive for congestion (sinuses).   Musculoskeletal: Positive for arthralgias (left hip).  All other systems reviewed and are  negative.     Objective:  Physical Exam  Nursing note and vitals reviewed. Constitutional: He is oriented to person, place, and time. He appears well-developed and well-nourished.  HENT:  Head: Normocephalic and atraumatic.  Nose: Nose normal.  Eyes: EOM are normal.  Neck: Normal range of motion.  Cardiovascular: Normal rate.   Pulmonary/Chest: Effort normal.  Abdominal: Hernia confirmed negative in the right inguinal area.  Musculoskeletal: Normal range of motion.  Neurological: He is alert and oriented to person, place, and time.  Skin: Skin is warm and dry.  Psychiatric: He has a normal mood and affect. His behavior is normal.   Patient has tenderness in the left sacroiliac region. He has full range of motion of the hip and normal straight leg raising. He has no pain over the greater trochanter of the left side. There is no bony or skin changes suggestive of trauma.    BP 110/74  Pulse 70  Temp(Src) 97.7 F (36.5 C) (Oral)  Resp 16  Ht 5' 6.5" (1.689 m)  Wt 174 lb (78.926 kg)  BMI 27.67 kg/m2  SpO2 93% Assessment & Plan:    I personally performed the services described in this documentation, which was scribed in my presence. The recorded information has been reviewed and  is accurate.  Sacroiliac inflammation - Plan: methylPREDNISolone acetate (DEPO-MEDROL) injection 80 mg, fluticasone (FLONASE) 50 MCG/ACT nasal spray  Allergic rhinitis - Plan: fluticasone (FLONASE) 50 MCG/ACT nasal spray  Signed, Elvina Sidle, MD

## 2014-01-18 DIAGNOSIS — N139 Obstructive and reflux uropathy, unspecified: Secondary | ICD-10-CM | POA: Diagnosis not present

## 2014-01-18 DIAGNOSIS — N529 Male erectile dysfunction, unspecified: Secondary | ICD-10-CM | POA: Diagnosis not present

## 2014-01-28 ENCOUNTER — Encounter: Payer: Self-pay | Admitting: *Deleted

## 2014-01-28 DIAGNOSIS — N139 Obstructive and reflux uropathy, unspecified: Secondary | ICD-10-CM

## 2014-01-28 DIAGNOSIS — N529 Male erectile dysfunction, unspecified: Secondary | ICD-10-CM

## 2014-02-20 DIAGNOSIS — H04129 Dry eye syndrome of unspecified lacrimal gland: Secondary | ICD-10-CM | POA: Diagnosis not present

## 2014-02-20 DIAGNOSIS — H02839 Dermatochalasis of unspecified eye, unspecified eyelid: Secondary | ICD-10-CM | POA: Diagnosis not present

## 2014-02-20 DIAGNOSIS — H1045 Other chronic allergic conjunctivitis: Secondary | ICD-10-CM | POA: Diagnosis not present

## 2014-02-20 DIAGNOSIS — H01029 Squamous blepharitis unspecified eye, unspecified eyelid: Secondary | ICD-10-CM | POA: Diagnosis not present

## 2014-02-20 DIAGNOSIS — Z961 Presence of intraocular lens: Secondary | ICD-10-CM | POA: Diagnosis not present

## 2014-05-21 DIAGNOSIS — L578 Other skin changes due to chronic exposure to nonionizing radiation: Secondary | ICD-10-CM | POA: Diagnosis not present

## 2014-05-21 DIAGNOSIS — L989 Disorder of the skin and subcutaneous tissue, unspecified: Secondary | ICD-10-CM | POA: Diagnosis not present

## 2014-05-24 DIAGNOSIS — L280 Lichen simplex chronicus: Secondary | ICD-10-CM | POA: Diagnosis not present

## 2014-06-05 DIAGNOSIS — L280 Lichen simplex chronicus: Secondary | ICD-10-CM | POA: Diagnosis not present

## 2014-06-05 DIAGNOSIS — L608 Other nail disorders: Secondary | ICD-10-CM | POA: Diagnosis not present

## 2014-06-26 DIAGNOSIS — S61409A Unspecified open wound of unspecified hand, initial encounter: Secondary | ICD-10-CM | POA: Diagnosis not present

## 2014-06-26 DIAGNOSIS — Z23 Encounter for immunization: Secondary | ICD-10-CM | POA: Diagnosis not present

## 2014-07-03 ENCOUNTER — Encounter: Payer: Self-pay | Admitting: Family Medicine

## 2014-07-03 ENCOUNTER — Ambulatory Visit (INDEPENDENT_AMBULATORY_CARE_PROVIDER_SITE_OTHER): Payer: Medicare Other | Admitting: Family Medicine

## 2014-07-03 VITALS — BP 116/68 | HR 69 | Temp 97.3°F | Resp 16 | Ht 67.0 in | Wt 173.4 lb

## 2014-07-03 DIAGNOSIS — W540XXA Bitten by dog, initial encounter: Secondary | ICD-10-CM | POA: Diagnosis not present

## 2014-07-03 DIAGNOSIS — M79605 Pain in left leg: Secondary | ICD-10-CM

## 2014-07-03 DIAGNOSIS — S61409A Unspecified open wound of unspecified hand, initial encounter: Secondary | ICD-10-CM

## 2014-07-03 DIAGNOSIS — M79609 Pain in unspecified limb: Secondary | ICD-10-CM | POA: Diagnosis not present

## 2014-07-03 DIAGNOSIS — J302 Other seasonal allergic rhinitis: Secondary | ICD-10-CM

## 2014-07-03 DIAGNOSIS — M79604 Pain in right leg: Secondary | ICD-10-CM

## 2014-07-03 DIAGNOSIS — R197 Diarrhea, unspecified: Secondary | ICD-10-CM | POA: Diagnosis not present

## 2014-07-03 DIAGNOSIS — T148XXA Other injury of unspecified body region, initial encounter: Secondary | ICD-10-CM | POA: Diagnosis not present

## 2014-07-03 DIAGNOSIS — S61452A Open bite of left hand, initial encounter: Secondary | ICD-10-CM

## 2014-07-03 DIAGNOSIS — J3089 Other allergic rhinitis: Secondary | ICD-10-CM

## 2014-07-03 NOTE — Progress Notes (Signed)
80 yo [place] veteran who was bitten one week ago on his left hand.  He was seen at [facility] and put on Augmentin and given a tetanus shot.  He subsequently scraped his left forearm on some brush in the yard.  Patient developed diarrhea and so he stopped the Augmentin after 5 days.  He says the diarrhea is easing off now.  Patient also notes some chronic leg pain.  He went to the [employer] last week and his legs didn't feel so bad, and he has [place] the lake and walked some with his grandchildren.  He continues to make wooden toys.  He said that he thinks he is deconditioned, and walking at [employer] yesterday his legs felt better.  Patient denies chest pain, shortness of breath, abdominal pain or significant increase in swelling of feet.  He also denies increasing swelling of left hand or redness there.  He denies pain left hand pain or loss of ROM  Patient has been noticing increased rhinorrhea lately.  He has steroid nasal spray and wants to know if he should restart this.  Last time he used it he developed some epistaxis, but he believes it has helped.  Objective:  NAD  Inspection of wounds on left hand and left forearm show good healing.  They are dry without erythema and the wound edges are fairly well approximated, measuring about 1.3 cm each in length.  ROM of hand and wrist is normal Both wounds on left upper extremity dermabonded after cleanse with isopropyl alcohol  Chest is clear Heart is regular, no murmur Abdomen:  No bruit, mass, HSM or mass.  Normal bowel sounds.  No femoral bruit Extrem:  1+ pedal edema with thready pedal pulses.  No calf tenderness. Nasal passages:  Mild increase in clear discharge Oroph: clear Neck:  No bruit, thyromegaly, adenopathy.  Supple.  Assessment: 1. Dog bite:  Healing well and now protected with dermabond 2. Diarrhea, antibiotic induced:  Improved with cessation of Augmentin.  Patient will take 2 days of probiotic. 3. Lower  extremity aching:  No evidence of DVT or claudication, seems to be improving with increased activity. 4. Allergic rhinitis:  Will restart the steroid nasal spray.  Given technique to minimize septal trauma.  Signed, Elvina Sidle, MD

## 2014-08-26 ENCOUNTER — Encounter: Payer: Medicare Other | Admitting: Family Medicine

## 2014-09-19 ENCOUNTER — Ambulatory Visit (INDEPENDENT_AMBULATORY_CARE_PROVIDER_SITE_OTHER): Payer: Medicare Other | Admitting: Radiology

## 2014-09-19 ENCOUNTER — Ambulatory Visit: Payer: Medicare Other | Admitting: Radiology

## 2014-09-19 DIAGNOSIS — Z23 Encounter for immunization: Secondary | ICD-10-CM | POA: Diagnosis not present

## 2014-10-24 ENCOUNTER — Encounter: Payer: Self-pay | Admitting: Family Medicine

## 2014-10-24 ENCOUNTER — Ambulatory Visit (INDEPENDENT_AMBULATORY_CARE_PROVIDER_SITE_OTHER): Payer: Medicare Other | Admitting: Family Medicine

## 2014-10-24 VITALS — BP 118/72 | HR 74 | Temp 98.0°F | Resp 16 | Ht 65.0 in | Wt 172.0 lb

## 2014-10-24 DIAGNOSIS — K5900 Constipation, unspecified: Secondary | ICD-10-CM

## 2014-10-24 DIAGNOSIS — H109 Unspecified conjunctivitis: Secondary | ICD-10-CM | POA: Diagnosis not present

## 2014-10-24 DIAGNOSIS — Z23 Encounter for immunization: Secondary | ICD-10-CM

## 2014-10-24 DIAGNOSIS — R5383 Other fatigue: Secondary | ICD-10-CM

## 2014-10-24 DIAGNOSIS — Z0000 Encounter for general adult medical examination without abnormal findings: Secondary | ICD-10-CM | POA: Diagnosis not present

## 2014-10-24 DIAGNOSIS — Z1211 Encounter for screening for malignant neoplasm of colon: Secondary | ICD-10-CM

## 2014-10-24 DIAGNOSIS — H9193 Unspecified hearing loss, bilateral: Secondary | ICD-10-CM

## 2014-10-24 DIAGNOSIS — E785 Hyperlipidemia, unspecified: Secondary | ICD-10-CM | POA: Diagnosis not present

## 2014-10-24 DIAGNOSIS — M461 Sacroiliitis, not elsewhere classified: Secondary | ICD-10-CM | POA: Diagnosis not present

## 2014-10-24 DIAGNOSIS — J309 Allergic rhinitis, unspecified: Secondary | ICD-10-CM

## 2014-10-24 LAB — COMPLETE METABOLIC PANEL WITH GFR
ALT: 10 U/L (ref 0–53)
AST: 19 U/L (ref 0–37)
Albumin: 3.7 g/dL (ref 3.5–5.2)
Alkaline Phosphatase: 66 U/L (ref 39–117)
BUN: 15 mg/dL (ref 6–23)
CO2: 26 mEq/L (ref 19–32)
Calcium: 9.4 mg/dL (ref 8.4–10.5)
Chloride: 106 mEq/L (ref 96–112)
Creat: 0.9 mg/dL (ref 0.50–1.35)
GFR, Est African American: >89 mL/min
GFR, Est Non African American: 79 mL/min
Glucose, Bld: 94 mg/dL (ref 70–99)
Potassium: 4.6 mEq/L (ref 3.5–5.3)
Sodium: 140 mEq/L (ref 135–145)
Total Bilirubin: 0.8 mg/dL (ref 0.2–1.2)
Total Protein: 6.4 g/dL (ref 6.0–8.3)

## 2014-10-24 LAB — POCT URINALYSIS DIPSTICK
Glucose, UA: NEGATIVE
Ketones, UA: NEGATIVE
Leukocytes, UA: NEGATIVE
Nitrite, UA: NEGATIVE
Protein, UA: NEGATIVE
Spec Grav, UA: 1.02
Urobilinogen, UA: 0.2
pH, UA: 6

## 2014-10-24 LAB — CBC WITH DIFFERENTIAL/PLATELET
Basophils Absolute: 0 10*3/uL (ref 0.0–0.1)
Basophils Relative: 1 % (ref 0–1)
Eosinophils Absolute: 0.1 10*3/uL (ref 0.0–0.7)
Eosinophils Relative: 2 % (ref 0–5)
HCT: 38.7 % — ABNORMAL LOW (ref 39.0–52.0)
Hemoglobin: 13.5 g/dL (ref 13.0–17.0)
Lymphocytes Relative: 34 % (ref 12–46)
Lymphs Abs: 1.5 10*3/uL (ref 0.7–4.0)
MCH: 32.8 pg (ref 26.0–34.0)
MCHC: 34.9 g/dL (ref 30.0–36.0)
MCV: 94.2 fL (ref 78.0–100.0)
MPV: 11.7 fL (ref 9.4–12.4)
Monocytes Absolute: 0.5 10*3/uL (ref 0.1–1.0)
Monocytes Relative: 11 % (ref 3–12)
Neutro Abs: 2.3 10*3/uL (ref 1.7–7.7)
Neutrophils Relative %: 52 % (ref 43–77)
Platelets: 164 10*3/uL (ref 150–400)
RBC: 4.11 MIL/uL — ABNORMAL LOW (ref 4.22–5.81)
RDW: 12.9 % (ref 11.5–15.5)
WBC: 4.4 10*3/uL (ref 4.0–10.5)

## 2014-10-24 LAB — TSH: TSH: 1.607 u[IU]/mL (ref 0.350–4.500)

## 2014-10-24 LAB — IFOBT (OCCULT BLOOD): IFOBT: NEGATIVE

## 2014-10-24 MED ORDER — FLUTICASONE PROPIONATE 50 MCG/ACT NA SUSP: 2.0000 | Freq: Every day | NASAL | Status: DC

## 2014-10-24 MED ORDER — ROSUVASTATIN CALCIUM 5 MG PO TABS: 5.0000 mg | ORAL_TABLET | Freq: Every day | ORAL | Status: DC

## 2014-10-24 MED ORDER — MOMETASONE FUROATE 0.1 % EX CREA: TOPICAL_CREAM | Freq: Two times a day (BID) | CUTANEOUS | Status: DC | PRN

## 2014-10-24 NOTE — Patient Instructions (Signed)
I am concerned about the recent constipation and fatigue.  I am checking the thyroid, and I will set up a consultation with Dr. Marina Goodell.  You have a mild conjunctivitis and should restart the drops the Dr. Dione Booze prescribed.  Usually 5 days of drops three times a day is sufficient.  Thank you for the gift.  You and your wife are so kind to me  Let's remember the great [place] community together.Health Maintenance A healthy lifestyle and preventative care can promote health and wellness.  Maintain regular health, dental, and eye exams.  Eat a healthy diet. Foods like vegetables, fruits, whole grains, low-fat dairy products, and lean protein foods contain the nutrients you need and are low in calories. Decrease your intake of foods high in solid fats, added sugars, and salt. Get information about a proper diet from your health care provider, if necessary.  Regular physical exercise is one of the most important things you can do for your health. Most adults should get at least 150 minutes of moderate-intensity exercise (any activity that increases your heart rate and causes you to sweat) each week. In addition, most adults need muscle-strengthening exercises on 2 or more days a week.   Maintain a healthy weight. The body mass index (BMI) is a screening tool to identify possible weight problems. It provides an estimate of body fat based on height and weight. Your health care provider can find your BMI and can help you achieve or maintain a healthy weight. For males 20 years and older:  A BMI below 18.5 is considered underweight.  A BMI of 18.5 to 24.9 is normal.  A BMI of 25 to 29.9 is considered overweight.  A BMI of 30 and above is considered obese.  Maintain normal blood lipids and cholesterol by exercising and minimizing your intake of saturated fat. Eat a balanced diet with plenty of fruits and vegetables. Blood tests for lipids and cholesterol should begin at age 82 and be repeated every  5 years. If your lipid or cholesterol levels are high, you are over age 73, or you are at high risk for heart disease, you may need your cholesterol levels checked more frequently.Ongoing high lipid and cholesterol levels should be treated with medicines if diet and exercise are not working.  If you smoke, find out from your health care provider how to quit. If you do not use tobacco, do not start.  Lung cancer screening is recommended for adults aged 55-80 years who are at high risk for developing lung cancer because of a history of smoking. A yearly low-dose CT scan of the lungs is recommended for people who have at least a 30-pack-year history of smoking and are current smokers or have quit within the past 15 years. A pack year of smoking is smoking an average of 1 pack of cigarettes a day for 1 year (for example, a 30-pack-year history of smoking could mean smoking 1 pack a day for 30 years or 2 packs a day for 15 years). Yearly screening should continue until the smoker has stopped smoking for at least 15 years. Yearly screening should be stopped for people who develop a health problem that would prevent them from having lung cancer treatment.  If you choose to drink alcohol, do not have more than 2 drinks per day. One drink is considered to be 12 oz (360 mL) of beer, 5 oz (150 mL) of wine, or 1.5 oz (45 mL) of liquor.  Avoid the use of street  drugs. Do not share needles with anyone. Ask for help if you need support or instructions about stopping the use of drugs.  High blood pressure causes heart disease and increases the risk of stroke. Blood pressure should be checked at least every 1-2 years. Ongoing high blood pressure should be treated with medicines if weight loss and exercise are not effective.  If you are 4745-362 years old, ask your health care provider if you should take aspirin to prevent heart disease.  Diabetes screening involves taking a blood sample to check your fasting blood sugar  level. This should be done once every 3 years after age 78 if you are at a normal weight and without risk factors for diabetes. Testing should be considered at a younger age or be carried out more frequently if you are overweight and have at least 1 risk factor for diabetes.  Colorectal cancer can be detected and often prevented. Most routine colorectal cancer screening begins at the age of 78 and continues through age 78. However, your health care provider may recommend screening at an earlier age if you have risk factors for colon cancer. On a yearly basis, your health care provider may provide home test kits to check for hidden blood in the stool. A small camera at the end of a tube may be used to directly examine the colon (sigmoidoscopy or colonoscopy) to detect the earliest forms of colorectal cancer. Talk to your health care provider about this at age 78 when routine screening begins. A direct exam of the colon should be repeated every 5-10 years through age 78, unless early forms of precancerous polyps or small growths are found.  People who are at an increased risk for hepatitis B should be screened for this virus. You are considered at high risk for hepatitis B if:  You were born in a country where hepatitis B occurs often. Talk with your health care provider about which countries are considered high risk.  Your parents were born in a high-risk country and you have not received a shot to protect against hepatitis B (hepatitis B vaccine).  You have HIV or AIDS.  You use needles to inject street drugs.  You live with, or have sex with, someone who has hepatitis B.  You are a man who has sex with other men (MSM).  You get hemodialysis treatment.  You take certain medicines for conditions like cancer, organ transplantation, and autoimmune conditions.  Hepatitis C blood testing is recommended for all people born from [id number] through 1963 and any individual with known risk factors for  hepatitis C.  Healthy men should no longer receive prostate-specific antigen (PSA) blood tests as part of routine cancer screening. Talk to your health care provider about prostate cancer screening.  Testicular cancer screening is not recommended for adolescents or adult males who have no symptoms. Screening includes self-exam, a health care provider exam, and other screening tests. Consult with your health care provider about any symptoms you have or any concerns you have about testicular cancer.  Practice safe sex. Use condoms and avoid high-risk sexual practices to reduce the spread of sexually transmitted infections (STIs).  You should be screened for STIs, including gonorrhea and chlamydia if:  You are sexually active and are younger than 24 years.  You are older than 24 years, and your health care provider tells you that you are at risk for this type of infection.  Your sexual activity has changed since you were last screened, and you  are at an increased risk for chlamydia or gonorrhea. Ask your health care provider if you are at risk.  If you are at risk of being infected with HIV, it is recommended that you take a prescription medicine daily to prevent HIV infection. This is called pre-exposure prophylaxis (PrEP). You are considered at risk if:  You are a man who has sex with other men (MSM).  You are a heterosexual man who is sexually active with multiple partners.  You take drugs by injection.  You are sexually active with a partner who has HIV.  Talk with your health care provider about whether you are at high risk of being infected with HIV. If you choose to begin PrEP, you should first be tested for HIV. You should then be tested every 3 months for as long as you are taking PrEP.  Use sunscreen. Apply sunscreen liberally and repeatedly throughout the day. You should seek shade when your shadow is shorter than you. Protect yourself by wearing long sleeves, pants, a  wide-brimmed hat, and sunglasses year round whenever you are outdoors.  Tell your health care provider of new moles or changes in moles, especially if there is a change in shape or color. Also, tell your health care provider if a mole is larger than the size of a pencil eraser.  A one-time screening for abdominal aortic aneurysm (AAA) and surgical repair of large AAAs by ultrasound is recommended for men aged 65-75 years who are current or former smokers.  Stay current with your vaccines (immunizations). Document Released: 04/22/2008 Document Revised: 10/30/2013 Document Reviewed: 03/22/2011 [facility] Patient Information 2015 [employer], [place]. This information is not intended to replace advice given to you by your health care provider. Make sure you discuss any questions you have with your health care provider.

## 2014-10-24 NOTE — Progress Notes (Signed)
Patient ID: Bill F Mayson Jr. MRN: [id number], DOB: 12-Jun-1929 80 y.o. Date of Encounter: 10/24/2014, 9:36 AM  Primary PDelane Gingerhysician: Rogelia BogaKWIATKOWSKI,PETER FRANK, MD  Chief Complaint: Physical (CPE)  HPI: 80 y.o. y/o male with history noted below here for CPE.  Doing well. He recently saw Dr. Dione BoozeGroat Has seen Dr. Marina GoodellPerry in past for colonoscopy Lately he has been more fatigued and having constipaton.  No blood in stool or abdominal pain or weight loss  Review of Systems: Consitutional: No fever, chills, fatigue, night sweats, lymphadenopathy, or weight changes. Eyes: No visual changes, eye redness, or discharge. ENT/Mouth: Ears: No otalgia, tinnitus, hearing loss, discharge. Nose: No congestion, rhinorrhea, sinus pain, or epistaxis. Throat: No sore throat, post nasal drip, or teeth pain. Cardiovascular: No CP, palpitations, diaphoresis, DOE, edema, orthopnea, PND. Respiratory: No cough, hemoptysis, SOB, or wheezing. Gastrointestinal: No anorexia, dysphagia, reflux, pain, nausea, vomiting, hematemesis, diarrhea, BRBPR, or melena. Genitourinary: No dysuria, frequency, urgency, hematuria, incontinence, nocturia, decreased urinary stream, discharge, impotence, or testicular pain/masses. Musculoskeletal: No decreased ROM, myalgias, stiffness, joint swelling, or weakness. Skin: No rash, erythema, lesion changes, pain, warmth, jaundice, or pruritis. Neurological: No headache, dizziness, syncope, seizures, tremors, memory loss, coordination problems, or paresthesias. Psychological: No anxiety, depression, hallucinations, SI/HI. Endocrine: No fatigue, polydipsia, polyphagia, polyuria, or known diabetes. All other systems were reviewed and are otherwise negative.  Past Medical History  Diagnosis Date  . Kidney stone   . Hyperlipidemia   . Hemorrhoids     He has had the pneumovax in the past S/p hernia surgery, left rotator cuff surgery  Home Meds:  Prior to Admission medications   Medication  Sig Start Date End Date Taking? Authorizing Provider  fluticasone (FLONASE) 50 MCG/ACT nasal spray Place 2 sprays into both nostrils daily. 12/06/13  Yes Elvina SidleKurt Blakelee Allington, MD  camphor-menthol [facility](SARNA) lotion Apply topically 3 (three) times daily as needed.     Historical Provider, MD  cetirizine (ZYRTEC) 10 MG tablet Take 10 mg by mouth 2 (two) times daily as needed.      Historical Provider, MD  mometasone (ELOCON) 0.1 % cream Apply topically 2 (two) times daily as needed.      Historical Provider, MD  rosuvastatin (CRESTOR) 5 MG tablet Take 1 tablet (5 mg total) by mouth daily. Patient not taking: Reported on 10/24/2014 11/16/11   Gordy SaversPeter F Kwiatkowski, MD    Allergies:  Allergies  Allergen Reactions  . Augmentin [Amoxicillin-Pot Clavulanate] Diarrhea  . Crestor [Rosuvastatin]     Shaky, hot flashes    History   Social History  . Marital Status: Married    Spouse Name: N/A    Number of Children: N/A  . Years of Education: N/A   Occupational History  . Not on file.   Social History Main Topics  . Smoking status: Never Smoker   . Smokeless tobacco: Never Used  . Alcohol Use: Yes  . Drug Use: No  . Sexual Activity: No   Other Topics Concern  . Not on file   Social History Narrative    Family History  Problem Relation Age of Onset  . Family history unknown: Yes    Physical Exam: Blood pressure 118/72, pulse 74, temperature 98 F (36.7 C), resp. rate 16, height 5\' 5"  (1.651 m), weight 172 lb (78.019 kg), SpO2 98 %.  BP Readings from Last 3 Encounters:  10/24/14 118/72  07/03/14 116/68  12/06/13 110/74   General: Well developed, well nourished, in no acute distress. HEENT: Normocephalic, atraumatic. Conjunctiva injected left >  right, sclera non-icteric. Pupils 2 mm constricting to 1 mm, round, regular, and equally reactive to light and accomodation. EOMI.  Fundi benign   Internal auditory canal clear. Using hearing aids. TMs with good cone of light and without pathology.  Nasal mucosa pink. Nares are without discharge. No sinus tenderness. Oral mucosa pink. Dentition good. Pharynx without exudate.    Neck: Supple. Trachea midline. No thyromegaly. Full ROM. No lymphadenopathy. Lungs: Clear to auscultation bilaterally without wheezes, rales, or rhonchi. Breathing is of normal effort and unlabored. Cardiovascular: RRR with S1 S2. No murmurs, rubs, or gallops appreciated. Distal pulses 2+ symmetrically. No carotid or abdominal bruits Abdomen: Soft, non-tender, non-distended with normoactive bowel sounds. No hepatosplenomegaly or masses. No rebound/guarding. No CVA tenderness. Without hernias.  Rectal: No external hemorrhoids or fissures. Rectal vault without masses.  Genitourinary:  circumcised male. No penile lesions. Testes descended bilaterally, and smooth without tenderness or masses.  Musculoskeletal: Full range of motion and 5/5 strength throughout. Without swelling, atrophy, tenderness, crepitus, or warmth. Extremities without clubbing, cyanosis, or edema. Calves supple. Skin: Warm and moist without erythema, ecchymosis, wounds, or rash. Neuro: A+Ox3. CN II-XII grossly intact. Moves all extremities spontaneously. Full sensation throughout. Normal gait. DTR 2+ throughout upper and lower extremities. Finger to nose intact. Psych:  Responds to questions appropriately with a normal affect.   Lab Results  Component Value Date   CHOL 149 09/16/2011   CHOL 235* 09/28/2010   CHOL  04/11/2010    161        ATP III CLASSIFICATION:  <200     mg/dL   Desirable  161-096  mg/dL   Borderline High  >=045    mg/dL   High          Lab Results  Component Value Date   HDL 60.50 09/16/2011   HDL 58.40 09/28/2010   HDL 54 04/11/2010   Lab Results  Component Value Date   LDLCALC 70 09/16/2011   LDLCALC  04/11/2010    96        Total Cholesterol/HDL:CHD Risk Coronary Heart Disease Risk Table                     Men   Women  1/2 Average Risk   3.4   3.3  Average  Risk       5.0   4.4  2 X Average Risk   9.6   7.1  3 X Average Risk  23.4   11.0        Use the calculated Patient Ratio above and the CHD Risk Table to determine the patient's CHD Risk.        ATP III CLASSIFICATION (LDL):  <100     mg/dL   Optimal  409-811  mg/dL   Near or Above                    Optimal  130-159  mg/dL   Borderline  914-782  mg/dL   High  >956     mg/dL   Very High   LDLCALC 77 04/22/2009   Lab Results  Component Value Date   TRIG 95.0 09/16/2011   TRIG 68.0 09/28/2010   TRIG 55 04/11/2010   Lab Results  Component Value Date   CHOLHDL 2 09/16/2011   CHOLHDL 4 09/28/2010   CHOLHDL 3.0 04/11/2010   Lab Results  Component Value Date   LDLDIRECT 150.4 09/28/2010   LDLDIRECT 144.4 03/21/2007    Assessment/Plan:  80 y.o. y/o  male here for CPE I am concerned with the recent fatigue and constipation.  He should have repeat colonoscopy if labs are normal. Prevnar given today. Patient to restart conjunctivitis drops. Annual physical exam  Constipation, unspecified constipation type  Other fatigue - Plan: CBC with Differential, TSH, COMPLETE METABOLIC PANEL WITH GFR, POCT urinalysis dipstick  Hyperlipidemia  Special screening for malignant neoplasms, colon - Plan: IFOBT POC (occult bld, rslt in office)  Bilateral conjunctivitis    Signed, Elvina SidleKurt Stepheni Cameron, MD 10/24/2014 9:36 AM

## 2014-10-24 NOTE — Addendum Note (Signed)
Addended by: Elvina SidleLAUENSTEIN, Jerone Cudmore on: 10/24/2014 04:52 PM   Modules accepted: Orders

## 2014-10-25 ENCOUNTER — Encounter: Payer: Self-pay | Admitting: Gastroenterology

## 2014-10-28 ENCOUNTER — Telehealth: Payer: Self-pay

## 2014-10-28 NOTE — Telephone Encounter (Signed)
Pt's wife called about Rxs sent at [facility]. She wasn't sure what the cream was for but they did pick it up and he will use it on the "itchy place he has on his arm". She did report that pt can NOT take Crestor and they did not p/up from pharm. He had a bad reaction to it when it was Rxd to him by prev doctor, and that doctor just left him off of any chol meds. I advised her that I would let you know, but I don't see a recent lipid test and the last one in the system was normal. Dr L, do you want him on a chol med? We do not need to call pt back unless Dr L has any instr's about med.

## 2014-10-28 NOTE — Telephone Encounter (Signed)
I never meant to order Crestor.  I don't understand how this happened.

## 2014-10-31 ENCOUNTER — Encounter: Payer: Self-pay | Admitting: Nurse Practitioner

## 2014-10-31 ENCOUNTER — Ambulatory Visit (INDEPENDENT_AMBULATORY_CARE_PROVIDER_SITE_OTHER): Payer: Medicare Other | Admitting: Nurse Practitioner

## 2014-10-31 VITALS — BP 110/62 | HR 72 | Ht 66.25 in | Wt 173.5 lb

## 2014-10-31 DIAGNOSIS — R194 Change in bowel habit: Secondary | ICD-10-CM | POA: Diagnosis not present

## 2014-10-31 NOTE — Patient Instructions (Signed)
Glad you are doing well, please follow-up with us as needed.  Have a very Merry Christmas!

## 2014-10-31 NOTE — Progress Notes (Signed)
    HPI :   Patient is an 80 year old male known remotely to Dr. Marina GoodellPerry. He has history of adenomatous colon polyps. Last colonoscopy April 2007 was normal except for internal hemorrhoids.  Patient referred by PCP for evaluation recent bowel changes and abdominal pain. In October patient had several days of lower abdominal pain followed by a couple of loose bowel movements. Pain resolved and bowel movements then normalized. A few weeks later patient had a recurrence of bowel changes but no associated abdominal pain that time. Bowel movements have been back to normal for least nearly 2 weeks now. No blood in his stool. No nausea, vomiting or weight loss. Patient feels great. His only medication is that of a nasal spray.  Past Medical History  Diagnosis Date  . Kidney stone   . Hyperlipidemia   . Hemorrhoids   . Anxiety   . PVD (peripheral vascular disease)   . Orthostatic hypotension   . Allergic rhinitis   . GERD (gastroesophageal reflux disease)   . IBS (irritable bowel syndrome)   . Arthritis   . Osteopenia   . Impotence   . Urinary obstruction   . Colon polyp     Family History  Problem Relation Age of Onset  . Colon cancer Neg Hx   . Colon polyps Neg Hx   . Diabetes Neg Hx   . Kidney disease Neg Hx   . Esophageal cancer Neg Hx   . Gallbladder disease Neg Hx   . Heart disease Neg Hx   . Prostate cancer Brother    History  Substance Use Topics  . Smoking status: Never Smoker   . Smokeless tobacco: Never Used  . Alcohol Use: 0.0 oz/week    0 Not specified per week     Comment: 1 glass of wine a day   Current Outpatient Prescriptions  Medication Sig Dispense Refill  . fluticasone (FLONASE) 50 MCG/ACT nasal spray Place 2 sprays into both nostrils daily. 16 g 6   No current facility-administered medications for this visit.   Allergies  Allergen Reactions  . Augmentin [Amoxicillin-Pot Clavulanate] Diarrhea  . Crestor [Rosuvastatin]     Shaky, hot flashes      Review of Systems: Positive for hearing problems All other systems reviewed and negative except where noted in HPI.   Physical Exam: BP 110/62 mmHg  Pulse 72  Ht 5' 6.25" (1.683 m)  Wt 173 lb 8 oz (78.699 kg)  BMI 27.78 kg/m2 Constitutional: Pleasant,well-developed, white male in no acute distress. HEENT: Normocephalic and atraumatic. Conjunctivae are normal. No scleral icterus. Neck supple.  Cardiovascular: Normal rate, regular rhythm.  Pulmonary/chest: Effort normal and breath sounds normal. No wheezing, rales or rhonchi. Abdominal: Soft, nondistended, nontender. Bowel sounds active throughout. There are no masses palpable. No hepatomegaly. Extremities: no edema Lymphadenopathy: No cervical adenopathy noted. Neurological: Alert and oriented to person place and time. Skin: Skin is warm and dry. No rashes noted. Psychiatric: Normal mood and affect. Behavior is normal.   ASSESSMENT AND PLAN:  271. 80 year old male with recent bowel changes / lower abdominal pain. Symptoms have resolved, bowel movements normal for nearly 2 weeks now. No alarm symptoms. Patient will call if he has recurrent bowel changes and/or abdominal pain.  2. History of adenomatous colon polyps. Last colonoscopy was greater than 10 years ago and normal. Given age, no future  surveillance colonoscopies planned

## 2014-11-01 ENCOUNTER — Encounter: Payer: Self-pay | Admitting: Nurse Practitioner

## 2014-11-04 NOTE — Progress Notes (Signed)
Patient seen with nurse practitioner. Agree

## 2014-11-07 ENCOUNTER — Encounter: Payer: Medicare Other | Admitting: Family Medicine

## 2014-12-10 DIAGNOSIS — Z961 Presence of intraocular lens: Secondary | ICD-10-CM | POA: Diagnosis not present

## 2014-12-10 DIAGNOSIS — H01021 Squamous blepharitis right upper eyelid: Secondary | ICD-10-CM | POA: Diagnosis not present

## 2014-12-10 DIAGNOSIS — H43392 Other vitreous opacities, left eye: Secondary | ICD-10-CM | POA: Diagnosis not present

## 2014-12-10 DIAGNOSIS — H01025 Squamous blepharitis left lower eyelid: Secondary | ICD-10-CM | POA: Diagnosis not present

## 2014-12-10 DIAGNOSIS — H01024 Squamous blepharitis left upper eyelid: Secondary | ICD-10-CM | POA: Diagnosis not present

## 2014-12-10 DIAGNOSIS — H02831 Dermatochalasis of right upper eyelid: Secondary | ICD-10-CM | POA: Diagnosis not present

## 2014-12-10 DIAGNOSIS — H01022 Squamous blepharitis right lower eyelid: Secondary | ICD-10-CM | POA: Diagnosis not present

## 2014-12-10 DIAGNOSIS — H02834 Dermatochalasis of left upper eyelid: Secondary | ICD-10-CM | POA: Diagnosis not present

## 2015-01-02 ENCOUNTER — Emergency Department (HOSPITAL_COMMUNITY)
Admission: EM | Admit: 2015-01-02 | Discharge: 2015-01-02 | Disposition: A | Discharge status: Home / Self Care | Payer: No Typology Code available for payment source | Attending: Emergency Medicine | Admitting: Emergency Medicine

## 2015-01-02 ENCOUNTER — Emergency Department (HOSPITAL_COMMUNITY): Payer: No Typology Code available for payment source

## 2015-01-02 ENCOUNTER — Encounter (HOSPITAL_COMMUNITY): Payer: Self-pay | Admitting: Emergency Medicine

## 2015-01-02 DIAGNOSIS — R0781 Pleurodynia: Secondary | ICD-10-CM

## 2015-01-02 DIAGNOSIS — Z8739 Personal history of other diseases of the musculoskeletal system and connective tissue: Secondary | ICD-10-CM | POA: Diagnosis not present

## 2015-01-02 DIAGNOSIS — Y9389 Activity, other specified: Secondary | ICD-10-CM | POA: Diagnosis not present

## 2015-01-02 DIAGNOSIS — Z8719 Personal history of other diseases of the digestive system: Secondary | ICD-10-CM | POA: Diagnosis not present

## 2015-01-02 DIAGNOSIS — Z86010 Personal history of colonic polyps: Secondary | ICD-10-CM | POA: Diagnosis not present

## 2015-01-02 DIAGNOSIS — Z880 Allergy status to penicillin: Secondary | ICD-10-CM | POA: Diagnosis not present

## 2015-01-02 DIAGNOSIS — S299XXA Unspecified injury of thorax, initial encounter: Secondary | ICD-10-CM | POA: Insufficient documentation

## 2015-01-02 DIAGNOSIS — Z7951 Long term (current) use of inhaled steroids: Secondary | ICD-10-CM | POA: Diagnosis not present

## 2015-01-02 DIAGNOSIS — Y92410 Unspecified street and highway as the place of occurrence of the external cause: Secondary | ICD-10-CM | POA: Diagnosis not present

## 2015-01-02 DIAGNOSIS — Z87448 Personal history of other diseases of urinary system: Secondary | ICD-10-CM | POA: Diagnosis not present

## 2015-01-02 DIAGNOSIS — Y998 Other external cause status: Secondary | ICD-10-CM | POA: Insufficient documentation

## 2015-01-02 DIAGNOSIS — Z87442 Personal history of urinary calculi: Secondary | ICD-10-CM | POA: Insufficient documentation

## 2015-01-02 DIAGNOSIS — Z8659 Personal history of other mental and behavioral disorders: Secondary | ICD-10-CM | POA: Insufficient documentation

## 2015-01-02 MED ORDER — ACETAMINOPHEN 500 MG PO TABS
1000.0000 mg | ORAL_TABLET | Freq: Once | ORAL | Status: AC
  Administered 2015-01-02: 1000 mg via ORAL
  Filled 2015-01-02: qty 2
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
## 2015-02-19 DIAGNOSIS — Z87442 Personal history of urinary calculi: Secondary | ICD-10-CM | POA: Diagnosis not present

## 2015-08-07 ENCOUNTER — Ambulatory Visit (INDEPENDENT_AMBULATORY_CARE_PROVIDER_SITE_OTHER): Payer: Medicare Other | Admitting: Family Medicine

## 2015-08-07 DIAGNOSIS — Z23 Encounter for immunization: Secondary | ICD-10-CM | POA: Diagnosis not present

## 2015-08-29 ENCOUNTER — Telehealth: Payer: Self-pay | Admitting: Family Medicine

## 2015-08-29 NOTE — Telephone Encounter (Signed)
Patient is going to speak with Dr. Milus GlazierLauenstein about his physical when he brings his wife in for her appointment.

## 2015-10-09 ENCOUNTER — Ambulatory Visit (INDEPENDENT_AMBULATORY_CARE_PROVIDER_SITE_OTHER): Payer: Medicare Other | Admitting: Family Medicine

## 2015-10-09 VITALS — BP 110/64 | HR 74 | Temp 97.8°F | Resp 17 | Ht 67.5 in | Wt 168.0 lb

## 2015-10-09 DIAGNOSIS — J309 Allergic rhinitis, unspecified: Secondary | ICD-10-CM

## 2015-10-09 DIAGNOSIS — E539 Vitamin B deficiency, unspecified: Secondary | ICD-10-CM | POA: Diagnosis not present

## 2015-10-09 DIAGNOSIS — R5382 Chronic fatigue, unspecified: Secondary | ICD-10-CM

## 2015-10-09 DIAGNOSIS — Z0000 Encounter for general adult medical examination without abnormal findings: Secondary | ICD-10-CM | POA: Diagnosis not present

## 2015-10-09 DIAGNOSIS — E785 Hyperlipidemia, unspecified: Secondary | ICD-10-CM | POA: Diagnosis not present

## 2015-10-09 LAB — COMPLETE METABOLIC PANEL WITH GFR
ALT: 10 U/L (ref 9–46)
AST: 17 U/L (ref 10–35)
Albumin: 3.5 g/dL — ABNORMAL LOW (ref 3.6–5.1)
Alkaline Phosphatase: 66 U/L (ref 40–115)
BUN: 17 mg/dL (ref 7–25)
CO2: 24 mmol/L (ref 20–31)
Calcium: 8.7 mg/dL (ref 8.6–10.3)
Chloride: 104 mmol/L (ref 98–110)
Creat: 0.85 mg/dL (ref 0.70–1.11)
GFR, Est African American: >89 mL/min (ref >=60)
GFR, Est Non African American: 80 mL/min (ref >=60)
Glucose, Bld: 122 mg/dL — ABNORMAL HIGH (ref 65–99)
Potassium: 4 mmol/L (ref 3.5–5.3)
Sodium: 137 mmol/L (ref 135–146)
Total Bilirubin: 0.6 mg/dL (ref 0.2–1.2)
Total Protein: 6.4 g/dL (ref 6.1–8.1)

## 2015-10-09 LAB — POCT URINALYSIS DIP (MANUAL ENTRY)
Glucose, UA: NEGATIVE
Ketones, POC UA: NEGATIVE
Leukocytes, UA: NEGATIVE
Nitrite, UA: NEGATIVE
Protein Ur, POC: NEGATIVE
Spec Grav, UA: 1.02
Urobilinogen, UA: 0.2
pH, UA: 5

## 2015-10-09 LAB — POCT CBC
Granulocyte percent: 53 %G (ref 37–80)
HCT, POC: 35.4 % — AB (ref 43.5–53.7)
Hemoglobin: 12.3 g/dL — AB (ref 14.1–18.1)
Lymph, poc: 2 (ref 0.6–3.4)
MCH, POC: 32.9 pg — AB (ref 27–31.2)
MCHC: 34.6 g/dL (ref 31.8–35.4)
MCV: 94.9 fL (ref 80–97)
MID (cbc): 0.4 (ref 0–0.9)
MPV: 8.5 fL (ref 0–99.8)
POC Granulocyte: 2.7 (ref 2–6.9)
POC LYMPH PERCENT: 39.2 %L (ref 10–50)
POC MID %: 7.8 %M (ref 0–12)
Platelet Count, POC: 124 10*3/uL — AB (ref 142–424)
RBC: 3.73 M/uL — AB (ref 4.69–6.13)
RDW, POC: 12.7 %
WBC: 5.1 10*3/uL (ref 4.6–10.2)

## 2015-10-09 LAB — LIPID PANEL
Cholesterol: 189 mg/dL (ref 125–200)
HDL: 53 mg/dL (ref >=40)
LDL Cholesterol: 117 mg/dL (ref <130)
Total CHOL/HDL Ratio: 3.6 Ratio (ref <=5.0)
Triglycerides: 94 mg/dL (ref <150)
VLDL: 19 mg/dL (ref <30)

## 2015-10-09 MED ORDER — CYANOCOBALAMIN 1000 MCG/ML IJ SOLN
1000.0000 ug | INTRAMUSCULAR | Status: DC
  Administered 2015-10-09: 1000 ug via INTRAMUSCULAR

## 2015-10-09 NOTE — Progress Notes (Signed)
   Subjective:    Patient ID: Bill Price, male    DOB: [birth year], 81 y.o.   MRN: [id number]  HPI 81 year old gentleman here for checkup. He's had some allergic rhinitis in the past as well as kidney stones. At this point he is active and not having any medical problems.  He continues to work as which shop. Occasionally his back gives him problems but he's able solve this by lying on a flat floor and stretching out   Review of Systems No chest pain, shortness of breath, new rash, trouble with the abdomen or gastrointestinal issues, trouble voiding  Patient does have some hip pain occasionally    Objective:   Physical Exam BP 110/64 mmHg  Pulse 74  Temp(Src) 97.8 F (36.6 C) (Oral)  Resp 17  Ht 5' 7.5" (1.715 m)  Wt 168 lb (76.204 kg)  BMI 25.91 kg/m2  SpO2 98% Gen. appearance is that of a healthy elderly gentleman in no distress HEENT: Unremarkable Chest: Clear Heart: Regular no murmur Neck: Supple no adenopathy or thyromegaly Abdomen: Soft nontender Hips: Full range of motion nontender Skin: Unremarkable Neuro: Cranial nerves III through XII intact, gait is stable    Assessment & Plan:   This chart was scribed in my presence and reviewed by me personally.    ICD-9-CM ICD-10-CM   1. Annual physical exam V70.0 Z00.00   2. Allergic rhinitis, unspecified allergic rhinitis type 477.9 J30.9   3. Chronic fatigue 780.79 R53.82 POCT CBC     POCT urinalysis dipstick     COMPLETE METABOLIC PANEL WITH GFR     cyanocobalamin ((VITAMIN B-12)) injection 1,000 mcg  4. Hyperlipidemia 272.4 E78.5 Lipid panel     Signed, Elvina SidleKurt Mathius Birkeland, MD

## 2016-03-03 DIAGNOSIS — N139 Obstructive and reflux uropathy, unspecified: Secondary | ICD-10-CM | POA: Diagnosis not present

## 2016-03-03 DIAGNOSIS — N5201 Erectile dysfunction due to arterial insufficiency: Secondary | ICD-10-CM | POA: Diagnosis not present

## 2016-05-05 DIAGNOSIS — H9201 Otalgia, right ear: Secondary | ICD-10-CM | POA: Diagnosis not present

## 2016-08-16 ENCOUNTER — Ambulatory Visit (INDEPENDENT_AMBULATORY_CARE_PROVIDER_SITE_OTHER): Payer: Medicare Other | Admitting: Family Medicine

## 2016-08-16 VITALS — BP 110/66 | HR 60 | Temp 97.7°F | Ht 67.5 in | Wt 168.0 lb

## 2016-08-16 DIAGNOSIS — Z23 Encounter for immunization: Secondary | ICD-10-CM | POA: Diagnosis not present

## 2016-08-16 DIAGNOSIS — H9201 Otalgia, right ear: Secondary | ICD-10-CM | POA: Diagnosis not present

## 2016-08-16 DIAGNOSIS — Z862 Personal history of diseases of the blood and blood-forming organs and certain disorders involving the immune mechanism: Secondary | ICD-10-CM

## 2016-08-16 NOTE — Progress Notes (Signed)
[facility] [address] [place], [place] [phone] [phone]  Date:  08/16/2016   Name:  Bill Price   DOB:  08-06-1929   MRN:  [id number]  PCP:  Abbe AmsterdamOPLAND,Toneka Fullen, MD    Chief Complaint: Establish Care (Pt here to est care. Would like flu vaccine. c/o right ear pain that has been off and on for years. Unable to put hearing aid in. )   History of Present Illness:  Bill Price is a 82 y.o. very pleasant male patient who presents with the following:  Here today as a new patient to establish care.  His PCP Dr. Milus GlazierLauenstein has retired so he is seeking a new provider.  [place] [employer] [place] age 82.  [place] lived in [place]- he did 22 years in the [employer], and they had another career.  He saw a lot of the world during his tenure with the [employer] including [place] years in [place]. His wife has one child from her first marriage, they have several grand-kids.   He has noted a right ear problem for about 2 years.  He has seen ENT for this- Dr. Ezzard StandingNewman- maybe 2 years ago.  However he did not follow-up.   He feels like his hearing is ok in the right ear.  He notes that he will have pain into the right temple and pre-auricular space at times. He is not having this pain now. He does have a hearing aid for the right ear but notes that wearing it seems to exacerbate his pain  Never been a smoker Would like a flu shot today  Last labs nearly one year ago- he would like to do these today  Patient Active Problem List   Diagnosis Date Noted  . 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 ACTINIC KERATOSIS, HEAD 10/05/2010  . ANXIETY 06/04/2010  . IRRITABLE BOWEL SYNDROME 04/15/2010  . PERIPHERAL VASCULAR DISEASE 09/10/2009  . ALLERGIC RHINITIS 09/10/2009  . GERD 04/22/2009  . ARTHRITIS, HIP 05/15/2008  . OSTEOPENIA 01/17/2008  . HYPERLIPIDEMIA 09/21/2007  . RENAL CALCULUS, HX OF 09/21/2007     Past Medical History:  Diagnosis Date  . Allergic rhinitis   . Anxiety   . Arthritis   . Colon polyp   . GERD (gastroesophageal reflux disease)   . Hemorrhoids   . Hyperlipidemia   . IBS (irritable bowel syndrome)   . Impotence   . Kidney stone   . Orthostatic hypotension   . Osteopenia   . PVD (peripheral vascular disease) (HCC)   . Urinary obstruction     Past Surgical History:  Procedure Laterality Date  . COLONOSCOPY  2007  . HERNIA REPAIR  2005    Social History  Substance Use Topics  . Smoking status: Never Smoker  . Smokeless tobacco: Never Used  . Alcohol use 0.0 oz/week     Comment: 1 glass of wine a day    Family History  Problem Relation Age of Onset  . Colon cancer Neg Hx   . Colon polyps Neg Hx   . Diabetes Neg Hx   . Kidney disease Neg Hx   . Esophageal cancer Neg Hx   . Gallbladder disease Neg Hx   . Heart disease Neg Hx   . Prostate cancer Brother     Allergies  Allergen Reactions  . Augmentin [Amoxicillin-Pot Clavulanate] Diarrhea  .  Crestor [Rosuvastatin]     Shaky, hot flashes    Medication list has been reviewed and updated.  No current outpatient prescriptions on file prior to visit.   No current facility-administered medications on file prior to visit.     Review of Systems:  As per HPI- otherwise negative. No fever, chills, rash, CP, SOB   Physical Examination: Vitals:   08/16/16 1359  BP: 110/66  Pulse: 60  Temp: 97.7 F (36.5 C)   Vitals:   08/16/16 1359  Weight: 168 lb (76.2 kg)  Height: 5' 7.5" (1.715 m)   Body mass index is 25.92 kg/m. Ideal Body Weight: Weight in (lb) to have BMI = 25: 161.7  GEN: WDWN, NAD, Non-toxic, A & O x 3, well appearing elderly gentleman wearing hearing aid in the left ear.  He does have a right hearing aid as well but is not wearing it now No rash or skin lesion, mass noted on the right side of his head/ neck HEENT: Atraumatic, Normocephalic. Neck supple. No masses, No LAD.   Right ear canal and TM wnl, oropharynx normal.  PEERL,EOMI.    No apparent cause of ear pain Ears and Nose: No external deformity. CV: RRR, No M/G/R. No JVD. No thrill. No extra heart sounds. PULM: CTA B, no wheezes, crackles, rhonchi. No retractions. No resp. distress. No accessory muscle use. EXTR: No c/c/e NEURO Normal gait.  PSYCH: Normally interactive. Conversant. Not depressed or anxious appearing.  Calm demeanor.    Assessment and Plan: Right ear pain - Plan: CBC  History of anemia - Plan: CBC, Comprehensive metabolic panel  Here today to establish care Flu shot given today Noted to have mild anemia- recheck CBC and CMP today Advised that I do not know why he has this ear pain- ?trigeminal neuralgia.  Offered specialist referral but he declines for now Will plan further follow- up pending labs.   Signed Abbe Amsterdam, MD

## 2016-08-16 NOTE — Progress Notes (Signed)
Pre visit review using our clinic review tool, if applicable. No additional management support is needed unless otherwise documented below in the visit note. 

## 2016-08-16 NOTE — Patient Instructions (Signed)
Please go to lab for your blood draw, and I will be in touch with your results asap  If you would like to see a specialist about your ear pain just let me know

## 2016-08-17 ENCOUNTER — Encounter: Payer: Self-pay | Admitting: Family Medicine

## 2016-08-17 LAB — COMPREHENSIVE METABOLIC PANEL
ALBUMIN: 3.8 g/dL (ref 3.5–5.2)
ALT: 12 U/L (ref 0–53)
AST: 18 U/L (ref 0–37)
Alkaline Phosphatase: 64 U/L (ref 39–117)
BUN: 14 mg/dL (ref 6–23)
CHLORIDE: 103 meq/L (ref 96–112)
CO2: 29 meq/L (ref 19–32)
Calcium: 9.5 mg/dL (ref 8.4–10.5)
Creatinine, Ser: 0.84 mg/dL (ref 0.40–1.50)
GFR: 92.15 mL/min (ref >60.00)
GLUCOSE: 95 mg/dL (ref 70–99)
POTASSIUM: 4.6 meq/L (ref 3.5–5.1)
SODIUM: 138 meq/L (ref 135–145)
Total Bilirubin: 0.6 mg/dL (ref 0.2–1.2)
Total Protein: 6.9 g/dL (ref 6.0–8.3)

## 2016-08-17 LAB — CBC
HEMATOCRIT: 39.4 % (ref 39.0–52.0)
Hemoglobin: 13.3 g/dL (ref 13.0–17.0)
MCHC: 33.6 g/dL (ref 30.0–36.0)
MCV: 98.1 fl (ref 78.0–100.0)
Platelets: 139 10*3/uL — ABNORMAL LOW (ref 150.0–400.0)
RBC: 4.02 Mil/uL — ABNORMAL LOW (ref 4.22–5.81)
RDW: 12.8 % (ref 11.5–15.5)
WBC: 6 10*3/uL (ref 4.0–10.5)

## 2017-03-07 DIAGNOSIS — N400 Enlarged prostate without lower urinary tract symptoms: Secondary | ICD-10-CM | POA: Diagnosis not present

## 2017-03-09 ENCOUNTER — Telehealth: Payer: Self-pay | Admitting: Family Medicine

## 2017-03-09 NOTE — Telephone Encounter (Signed)
Called patient to schedule awv. Lvm for patient to call office to schedule appt.  °

## 2017-03-28 NOTE — Progress Notes (Addendum)
Subjective:   Bill Price is a 83 y.o. male who presents for Medicare Annual/Subsequent preventive examination.  Review of Systems:  No ROS.  Medicare Wellness Visit. Cardiac Risk Factors include: advanced age (>58men, >3 women);male gender;dyslipidemia Sleep patterns: Wakes often to help wife to bathroom.Sleeps 7 hrs. Feels rested. Home Safety/Smoke Alarms:  Feels safe in home. Smoke alarms in place.  Living environment; residence and Solicitor: lives with wife. Two story home. Lives on first floor. No guns Seat Belt Safety/Bike Helmet: Wears seat belt.   Counseling:   Eye Exam- Wears glasses. Dr.Groat yearly  Dental- Every 6 months. Can't recall name.  Male:   CCS-  Last 02/24/06: Normal. No longer screening due to age. PSA-  Lab Results  Component Value Date   PSA 0.47 03/21/2007       Objective:    Vitals: BP 138/66 (BP Location: Right Arm, Patient Position: Sitting, Cuff Size: Normal)   Pulse 60   Ht 5' 7.5" (1.715 m)   Wt 168 lb 12.8 oz (76.6 kg)   SpO2 97%   BMI 26.05 kg/m   Body mass index is 26.05 kg/m.  Tobacco History  Smoking Status  . Never Smoker  Smokeless Tobacco  . Never Used     Counseling given: No   Past Medical History:  Diagnosis Date  . Allergic rhinitis   . Anxiety   . Arthritis   . Colon polyp   . GERD (gastroesophageal reflux disease)   . Hemorrhoids   . Hyperlipidemia   . IBS (irritable bowel syndrome)   . Impotence   . Kidney stone   . Orthostatic hypotension   . Osteopenia   . PVD (peripheral vascular disease) (HCC)   . Urinary obstruction    Past Surgical History:  Procedure Laterality Date  . COLONOSCOPY  2007  . HERNIA REPAIR  2005   Family History  Problem Relation Age of Onset  . Prostate cancer Brother   . Colon cancer Neg Hx   . Colon polyps Neg Hx   . Diabetes Neg Hx   . Kidney disease Neg Hx   . Esophageal cancer Neg Hx   . Gallbladder disease Neg Hx   . Heart disease Neg Hx    History    Sexual Activity  . Sexual activity: No    No outpatient encounter prescriptions on file as of 03/29/2017.   No facility-administered encounter medications on file as of 03/29/2017.     Activities of Daily Living In your present state of health, do you have any difficulty performing the following activities: 03/29/2017  Hearing? Y  Vision? N  Difficulty concentrating or making decisions? N  Walking or climbing stairs? N  Dressing or bathing? N  Doing errands, shopping? N  Preparing Food and eating ? N  Using the Toilet? N  In the past six months, have you accidently leaked urine? N  Do you have problems with loss of bowel control? N  Managing your Medications? N  Managing your Finances? N  Housekeeping or managing your Housekeeping? N  Some recent data might be hidden    Patient Care Team: Copland, Gwenlyn Found, MD as PCP - General (Family Medicine)   Assessment:    Physical assessment deferred to PCP.  Exercise Activities and Dietary recommendations Current Exercise Habits: The patient does not participate in regular exercise at present, Exercise limited by: None identified Diet (meal preparation, eat out, water intake, caffeinated beverages, dairy products, fruits and vegetables): in general,  a "healthy" diet       Goals      Patient Stated   . maintain current healthy lifestyle. (pt-stated)      Fall Risk Fall Risk  03/29/2017 10/27/2015 07/03/2014  Falls in the past year? No No No   Depression Screen PHQ 2/9 Scores 03/29/2017 10/09/2015 07/03/2014  PHQ - 2 Score 0 0 0    Cognitive Function MMSE - Mini Mental State Exam 03/29/2017  Orientation to time 5  Orientation to Place 5  Registration 3  Attention/ Calculation 4  Recall 2  Language- name 2 objects 2  Language- repeat 1  Language- follow 3 step command 3  Language- read & follow direction 1  Write a sentence 1  Copy design 0  Total score 27        Immunization History  Administered Date(s)  Administered  . Influenza Split 08/26/2011, 08/08/2012  . Influenza Whole 09/21/2007, 08/20/2009, 08/04/2010  . Influenza, High Dose Seasonal PF 08/16/2016  . Influenza,inj,Quad PF,36+ Mos 08/16/2013, 09/19/2014, 08/07/2015  . Pneumococcal Conjugate-13 10/24/2014  . Tdap 03/09/2008   Screening Tests Health Maintenance  Topic Date Due  . PNA vac Low Risk Adult (2 of 2 - PPSV23) 10/25/2015  . INFLUENZA VACCINE  06/08/2017  . TETANUS/TDAP  06/26/2024      Plan:  Follow up with PCP as directed.  Continue to eat heart healthy diet (full of fruits, vegetables, whole grains, lean protein, water--limit salt, fat, and sugar intake) and increase physical activity as tolerated.  Continue doing brain stimulating activities (puzzles, reading, adult coloring books, staying active) to keep memory sharp.   Bring a copy of your advance directives to your next office visit.   I have personally reviewed and noted the following in the patient's chart:   . Medical and social history . Use of alcohol, tobacco or illicit drugs  . Current medications and supplements . Functional ability and status . Nutritional status . Physical activity . Advanced directives . List of other physicians . Hospitalizations, surgeries, and ER visits in previous 12 months . Vitals . Screenings to include cognitive, depression, and falls . Referrals and appointments  In addition, I have reviewed and discussed with patient certain preventive protocols, quality metrics, and best practice recommendations. A written personalized care plan for preventive services as well as general preventive health recommendations were provided to patient.     Avon GullyBritt, Lindalee Huizinga Angel, RN  03/29/2017  I have reviewed the above note by Ms. Moshe CiproBritt and agree with her documentation  JC

## 2017-03-29 ENCOUNTER — Ambulatory Visit (INDEPENDENT_AMBULATORY_CARE_PROVIDER_SITE_OTHER): Payer: Medicare Other | Admitting: *Deleted

## 2017-03-29 ENCOUNTER — Encounter: Payer: Self-pay | Admitting: *Deleted

## 2017-03-29 VITALS — BP 138/66 | HR 60 | Ht 67.5 in | Wt 168.8 lb

## 2017-03-29 DIAGNOSIS — Z0000 Encounter for general adult medical examination without abnormal findings: Secondary | ICD-10-CM

## 2017-03-29 NOTE — Patient Instructions (Addendum)
Bill Price , Thank you for taking time to come for your Medicare Wellness Visit. I appreciate your ongoing commitment to your health goals. Please review the following plan we discussed and let me know if I can assist you in the future.   These are the goals we discussed: Goals      Patient Stated   . maintain current healthy lifestyle. (pt-stated)       This is a list of the screening recommended for you and due dates:  Health Maintenance  Topic Date Due  . Pneumonia vaccines (2 of 2 - PPSV23) 10/25/2015  . Flu Shot  06/08/2017  . Tetanus Vaccine  06/26/2024   Continue to eat heart healthy diet (full of fruits, vegetables, whole grains, lean protein, water--limit salt, fat, and sugar intake) and increase physical activity as tolerated.  Continue doing brain stimulating activities (puzzles, reading, adult coloring books, staying active) to keep memory sharp.   Bring a copy of your advance directives to your next office visit.  Keep up the great work! You are amazing!!   Health Maintenance, Male A healthy lifestyle and preventive care is important for your health and wellness. Ask your health care provider about what schedule of regular examinations is right for you. What should I know about weight and diet?  Eat a Healthy Diet  Eat plenty of vegetables, fruits, whole grains, low-fat dairy products, and lean protein.  Do not eat a lot of foods high in solid fats, added sugars, or salt. Maintain a Healthy Weight  Regular exercise can help you achieve or maintain a healthy weight. You should:  Do at least 150 minutes of exercise each week. The exercise should increase your heart rate and make you sweat (moderate-intensity exercise).  Do strength-training exercises at least twice a week. Watch Your Levels of Cholesterol and Blood Lipids  Have your blood tested for lipids and cholesterol every 5 years starting at 81 years of age. If you are at high risk for heart disease, you  should start having your blood tested when you are 81 years old. You may need to have your cholesterol levels checked more often if:  Your lipid or cholesterol levels are high.  You are older than 81 years of age.  You are at high risk for heart disease. What should I know about cancer screening? Many types of cancers can be detected early and may often be prevented. Lung Cancer  You should be screened every year for lung cancer if:  You are a current smoker who has smoked for at least 30 years.  You are a former smoker who has quit within the past 15 years.  Talk to your health care provider about your screening options, when you should start screening, and how often you should be screened. Colorectal Cancer  Routine colorectal cancer screening usually begins at 81 years of age and should be repeated every 5-10 years until you are 81 years old. You may need to be screened more often if early forms of precancerous polyps or small growths are found. Your health care provider may recommend screening at an earlier age if you have risk factors for colon cancer.  Your health care provider may recommend using home test kits to check for hidden blood in the stool.  A small camera at the end of a tube can be used to examine your colon (sigmoidoscopy or colonoscopy). This checks for the earliest forms of colorectal cancer. Prostate and Testicular Cancer  Depending  on your age and overall health, your health care provider may do certain tests to screen for prostate and testicular cancer.  Talk to your health care provider about any symptoms or concerns you have about testicular or prostate cancer. Skin Cancer  Check your skin from head to toe regularly.  Tell your health care provider about any new moles or changes in moles, especially if:  There is a change in a mole's size, shape, or color.  You have a mole that is larger than a pencil eraser.  Always use sunscreen. Apply sunscreen  liberally and repeat throughout the day.  Protect yourself by wearing long sleeves, pants, a wide-brimmed hat, and sunglasses when outside. What should I know about heart disease, diabetes, and high blood pressure?  If you are 6-18 years of age, have your blood pressure checked every 3-5 years. If you are 70 years of age or older, have your blood pressure checked every year. You should have your blood pressure measured twice-once when you are at a hospital or clinic, and once when you are not at a hospital or clinic. Record the average of the two measurements. To check your blood pressure when you are not at a hospital or clinic, you can use:  An automated blood pressure machine at a pharmacy.  A home blood pressure monitor.  Talk to your health care provider about your target blood pressure.  If you are between 71-26 years old, ask your health care provider if you should take aspirin to prevent heart disease.  Have regular diabetes screenings by checking your fasting blood sugar level.  If you are at a normal weight and have a low risk for diabetes, have this test once every three years after the age of 79.  If you are overweight and have a high risk for diabetes, consider being tested at a younger age or more often.  A one-time screening for abdominal aortic aneurysm (AAA) by ultrasound is recommended for men aged 65-75 years who are current or former smokers. What should I know about preventing infection? Hepatitis B  If you have a higher risk for hepatitis B, you should be screened for this virus. Talk with your health care provider to find out if you are at risk for hepatitis B infection. Hepatitis C  Blood testing is recommended for:  Everyone born from [birth year] through 1963.  Anyone with known risk factors for hepatitis C. Sexually Transmitted Diseases (STDs)  You should be screened each year for STDs including gonorrhea and chlamydia if:  You are sexually active and are  younger than 81 years of age.  You are older than 81 years of age and your health care provider tells you that you are at risk for this type of infection.  Your sexual activity has changed since you were last screened and you are at an increased risk for chlamydia or gonorrhea. Ask your health care provider if you are at risk.  Talk with your health care provider about whether you are at high risk of being infected with HIV. Your health care provider may recommend a prescription medicine to help prevent HIV infection. What else can I do?  Schedule regular health, dental, and eye exams.  Stay current with your vaccines (immunizations).  Do not use any tobacco products, such as cigarettes, chewing tobacco, and e-cigarettes. If you need help quitting, ask your health care provider.  Limit alcohol intake to no more than 2 drinks per day. One drink equals 12 ounces  of beer, 5 ounces of wine, or 1 ounces of hard liquor.  Do not use street drugs.  Do not share needles.  Ask your health care provider for help if you need support or information about quitting drugs.  Tell your health care provider if you often feel depressed.  Tell your health care provider if you have ever been abused or do not feel safe at home. This information is not intended to replace advice given to you by your health care provider. Make sure you discuss any questions you have with your health care provider. Document Released: 04/22/2008 Document Revised: 06/23/2016 Document Reviewed: 07/29/2015 Elsevier Interactive Patient Education  2017 [employer].

## 2017-08-24 ENCOUNTER — Ambulatory Visit (INDEPENDENT_AMBULATORY_CARE_PROVIDER_SITE_OTHER): Payer: Medicare Other

## 2017-08-24 DIAGNOSIS — Z23 Encounter for immunization: Secondary | ICD-10-CM | POA: Diagnosis not present

## 2017-08-30 NOTE — Progress Notes (Addendum)
[facility] [address], [address] [place], [place] [address] [phone]  Date:  08/31/2017   Name:  Bill Price   DOB:  Jun 05, 1929   MRN:  [id number]  PCP:  Pearline Cables, MD    Chief Complaint: Bleeding/Bruising   History of Present Illness:  Bill Price is a 83 y.o. very pleasant male patient who presents with the following:  Elderly gentleman with history of PVD, GERD, hyperlipidemia.   Here today with concern of easy bruising on his hands and arms.  He notes that if he bumps into things he will bruise easily This has been going on for a couple of years- they will heal up on their own No bleeding from the skin No nosebleeds or blood in stool or urine He does not get bruises on any body parts except for his arms  Has not gotten worse  He takes NO medications- no aspirin even   He had his flu shot earlier this month  ?due for pneumovax - will give to him today Labs are due I last saw him about a year ago   Patient Active Problem List   Diagnosis Date Noted  . 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 ACTINIC KERATOSIS, HEAD 10/05/2010  . ANXIETY 06/04/2010  . IRRITABLE BOWEL SYNDROME 04/15/2010  . PERIPHERAL VASCULAR DISEASE 09/10/2009  . ALLERGIC RHINITIS 09/10/2009  . GERD 04/22/2009  . ARTHRITIS, HIP 05/15/2008  . OSTEOPENIA 01/17/2008  . HYPERLIPIDEMIA 09/21/2007  . RENAL CALCULUS, HX OF 09/21/2007    Past Medical History:  Diagnosis Date  . Allergic rhinitis   . Anxiety   . Arthritis   . Colon polyp   . GERD (gastroesophageal reflux disease)   . Hemorrhoids   . Hyperlipidemia   . IBS (irritable bowel syndrome)   . Impotence   . Kidney stone   . Orthostatic hypotension   . Osteopenia   . PVD (peripheral vascular disease) (HCC)   . Urinary obstruction     Past Surgical History:  Procedure Laterality Date  . COLONOSCOPY  2007  . HERNIA REPAIR  2005    Social History   Substance Use Topics  . Smoking status: Never Smoker  . Smokeless tobacco: Never Used  . Alcohol use 0.0 oz/week     Comment: 1 glass of wine a day    Family History  Problem Relation Age of Onset  . Prostate cancer Brother   . Colon cancer Neg Hx   . Colon polyps Neg Hx   . Diabetes Neg Hx   . Kidney disease Neg Hx   . Esophageal cancer Neg Hx   . Gallbladder disease Neg Hx   . Heart disease Neg Hx     Allergies  Allergen Reactions  . Augmentin [Amoxicillin-Pot Clavulanate] Diarrhea  . Crestor [Rosuvastatin]     Shaky, hot flashes    Medication list has been reviewed and updated.  No current outpatient prescriptions on file prior to visit.   No current facility-administered medications on file prior to visit.     Review of Systems:  As per HPI- otherwise negative.   Physical Examination: Vitals:   08/31/17 1059  BP: 132/70  Pulse: 67  Temp: 97.9 F (36.6 C)  SpO2: 97%   Vitals:   08/31/17 1059  Weight: 167 lb 9.6 oz (76 kg)  Height: 5' 7.5" (1.715 m)   Body mass index is 25.86 kg/m. Ideal Body Weight:  Weight in (lb) to have BMI = 25: 161.7  GEN: WDWN, NAD, Non-toxic, A & O x 3, well appearing elderly gentleman  HEENT: Atraumatic, Normocephalic. Neck supple. No masses, No LAD. Ears and Nose: No external deformity. CV: RRR, No M/G/R. No JVD. No thrill. No extra heart sounds. PULM: CTA B, no wheezes, crackles, rhonchi. No retractions. No resp. distress. No accessory muscle use. ABD: S, NT, ND, +BS. No rebound. No HSM. EXTR: No c/c/e NEURO Normal gait.  PSYCH: Normally interactive. Conversant. Not depressed or anxious appearing.  Calm demeanor.  He has minor bruising in various degrees of evolution over his hands and arms.  Appears typical for someone of his age    Assessment and Plan: Easy bruising - Plan: CBC, Comprehensive metabolic panel, Ferritin  Immunization due - Plan: Pneumococcal polysaccharide vaccine 23-valent greater than or equal to  2yo subcutaneous/IM  Anemia, unspecified type - Plan: CBC, Ferritin  Here today with concern of easy bruising Likely due to old age and thin skin,but will perform some basic labs for him as above  Offered my support with his wife's recent recurrent breast cancer diagnosis Update pneumonia vaccine today- uncertain if he had a pneumovax since age 83   Signed Aerie Donica, MD  Received labs- he had a colonoscopy about 10 years ago.  Will send him some stools cards due to mild anemia.   Letter to pt  Results for orders placed or performed in visit on 08/31/17  CBC  Result Value Ref Range   WBC 4.3 4.0 - 10.5 K/uL   RBC 3.79 (L) 4.22 - 5.81 Mil/uL   Platelets 142.0 (L) 150.0 - 400.0 K/uL   Hemoglobin 12.7 (L) 13.0 - 17.0 g/dL   HCT 56.437.9 (L) 33.239.0 - 95.152.0 %   MCV 100.0 78.0 - 100.0 fl   MCHC 33.5 30.0 - 36.0 g/dL   RDW 88.413.0 16.611.5 - 06.315.5 %  Comprehensive metabolic panel  Result Value Ref Range   Sodium 138 135 - 145 mEq/L   Potassium 4.3 3.5 - 5.1 mEq/L   Chloride 103 96 - 112 mEq/L   CO2 28 19 - 32 mEq/L   Glucose, Bld 90 70 - 99 mg/dL   BUN 16 6 - 23 mg/dL   Creatinine, Ser 0.160.89 0.40 - 1.50 mg/dL   Total Bilirubin 0.6 0.2 - 1.2 mg/dL   Alkaline Phosphatase 59 39 - 117 U/L   AST 17 0 - 37 U/L   ALT 10 0 - 53 U/L   Total Protein 6.5 6.0 - 8.3 g/dL   Albumin 3.7 3.5 - 5.2 g/dL   Calcium 9.3 8.4 - 01.010.5 mg/dL   GFR 93.2385.99 >55.73>60.00 mL/min  Ferritin  Result Value Ref Range   Ferritin 184.7 22.0 - 322.0 ng/mL

## 2017-08-31 ENCOUNTER — Ambulatory Visit (INDEPENDENT_AMBULATORY_CARE_PROVIDER_SITE_OTHER): Payer: Medicare Other | Admitting: Family Medicine

## 2017-08-31 VITALS — BP 132/70 | HR 67 | Temp 97.9°F | Ht 67.5 in | Wt 167.6 lb

## 2017-08-31 DIAGNOSIS — D649 Anemia, unspecified: Secondary | ICD-10-CM | POA: Diagnosis not present

## 2017-08-31 DIAGNOSIS — R238 Other skin changes: Secondary | ICD-10-CM

## 2017-08-31 DIAGNOSIS — R233 Spontaneous ecchymoses: Secondary | ICD-10-CM

## 2017-08-31 DIAGNOSIS — Z23 Encounter for immunization: Secondary | ICD-10-CM | POA: Diagnosis not present

## 2017-08-31 LAB — COMPREHENSIVE METABOLIC PANEL
ALT: 10 U/L (ref 0–53)
AST: 17 U/L (ref 0–37)
Albumin: 3.7 g/dL (ref 3.5–5.2)
Alkaline Phosphatase: 59 U/L (ref 39–117)
BILIRUBIN TOTAL: 0.6 mg/dL (ref 0.2–1.2)
BUN: 16 mg/dL (ref 6–23)
CALCIUM: 9.3 mg/dL (ref 8.4–10.5)
CHLORIDE: 103 meq/L (ref 96–112)
CO2: 28 meq/L (ref 19–32)
Creatinine, Ser: 0.89 mg/dL (ref 0.40–1.50)
GFR: 85.99 mL/min (ref >60.00)
GLUCOSE: 90 mg/dL (ref 70–99)
Potassium: 4.3 mEq/L (ref 3.5–5.1)
Sodium: 138 mEq/L (ref 135–145)
Total Protein: 6.5 g/dL (ref 6.0–8.3)

## 2017-08-31 LAB — CBC
HCT: 37.9 % — ABNORMAL LOW (ref 39.0–52.0)
Hemoglobin: 12.7 g/dL — ABNORMAL LOW (ref 13.0–17.0)
MCHC: 33.5 g/dL (ref 30.0–36.0)
MCV: 100 fl (ref 78.0–100.0)
Platelets: 142 10*3/uL — ABNORMAL LOW (ref 150.0–400.0)
RBC: 3.79 Mil/uL — AB (ref 4.22–5.81)
RDW: 13 % (ref 11.5–15.5)
WBC: 4.3 10*3/uL (ref 4.0–10.5)

## 2017-08-31 LAB — FERRITIN: Ferritin: 184.7 ng/mL (ref 22.0–322.0)

## 2017-08-31 NOTE — Patient Instructions (Signed)
It was nice to see you today- take care and I will be in touch with your labs asap I am sorry to hear of Bill Price's diagnosis.  Please let me know if I can do anything to help

## 2018-03-24 NOTE — Progress Notes (Signed)
Subjective:   Bill Price is a 84 y.o. male who presents for Medicare Annual/Subsequent preventive examination.  Review of Systems: No ROS.  Medicare Wellness Visit. Additional risk factors are reflected in the social history.    Sleep patterns: wakes once to help 52 yo wife to bathroom Home Safety/Smoke Alarms: Feels safe in home. Smoke alarms in place.  Living environment; residence and Firearm Safety: Lives with wife on first floor of 2 story home. Walk in shower. States they do fine. No children.  Male:       PSA-  Lab Results  Component Value Date   PSA 0.47 03/21/2007       Objective:    Vitals: BP 124/70 (BP Location: Left Arm, Patient Position: Sitting, Cuff Size: Normal)   Pulse 73   Ht  (1.727 m)   Wt 164 lb 12.8 oz (74.8 kg)   SpO2 97%   BMI 25.06 kg/m   Body mass index is 25.06 kg/m.  Advanced Directives 03/30/2018 03/29/2017 01/02/2015  Does Patient Have a Medical Advance Directive? No Yes No  Type of Advance Directive - Healthcare Power of [place];Living will -  Does patient want to make changes to medical advance directive? - No - Patient declined -  Copy of Healthcare Power of Attorney in Chart? - No - copy requested -  Would patient like information on creating a medical advance directive? No - Patient declined - Yes - Transport planner given    Tobacco Social History   Tobacco Use  Smoking Status Never Smoker  Smokeless Tobacco Never Used     Counseling given: Not Answered   Clinical Intake: Pain : No/denies pain    Past Medical History:  Diagnosis Date  . Allergic rhinitis   . Anxiety   . Arthritis   . Colon polyp   . GERD (gastroesophageal reflux disease)   . Hemorrhoids   . Hyperlipidemia   . IBS (irritable bowel syndrome)   . Impotence   . Kidney stone   . Orthostatic hypotension   . Osteopenia   . PVD (peripheral vascular disease) (HCC)   . Urinary obstruction    Past Surgical History:  Procedure Laterality  Date  . COLONOSCOPY  2007  . HERNIA REPAIR  2005   Family History  Problem Relation Age of Onset  . Prostate cancer Brother   . Colon cancer Neg Hx   . Colon polyps Neg Hx   . Diabetes Neg Hx   . Kidney disease Neg Hx   . Esophageal cancer Neg Hx   . Gallbladder disease Neg Hx   . Heart disease Neg Hx    Social History   Socioeconomic History  . Marital status: Married    Spouse name: Not on file  . Number of children: 0  . Years of education: Not on file  . Highest education level: Not on file  Occupational History  . Occupation: Retired  Engineer, production  . Financial resource strain: Not on file  . Food insecurity:    Worry: Not on file    Inability: Not on file  . Transportation needs:    Medical: Not on file    Non-medical: Not on file  Tobacco Use  . Smoking status: Never Smoker  . Smokeless tobacco: Never Used  Substance and Sexual Activity  . Alcohol use: Yes    Alcohol/week: 0.0 oz    Comment: 1 glass of wine a day  . Drug use: No  . Sexual activity:  Never  Lifestyle  . Physical activity:    Days per week: Not on file    Minutes per session: Not on file  . Stress: Not on file  Relationships  . Social connections:    Talks on phone: Not on file    Gets together: Not on file    Attends religious service: Not on file    Active member of club or organization: Not on file    Attends meetings of clubs or organizations: Not on file    Relationship status: Not on file  Other Topics Concern  . Not on file  Social History Narrative  . Not on file    No outpatient encounter medications on file as of 03/30/2018.   No facility-administered encounter medications on file as of 03/30/2018.     Activities of Daily Living In your present state of health, do you have any difficulty performing the following activities: 03/30/2018  Hearing? Y  Comment Has hearing aids not wearing them.  Vision? N  Comment Dr.Groat every 2 yrs. Wearing bifocals.   Difficulty  concentrating or making decisions? N  Walking or climbing stairs? N  Dressing or bathing? N  Doing errands, shopping? N  Preparing Food and eating ? N  Using the Toilet? N  In the past six months, have you accidently leaked urine? N  Do you have problems with loss of bowel control? N  Managing your Medications? N  Managing your Finances? N  Housekeeping or managing your Housekeeping? N  Comment housekeeper every other saturday.  Some recent data might be hidden    Patient Care Team: Copland, Gwenlyn Found, MD as PCP - General (Family Medicine)   Assessment:   This is a routine wellness examination for [name]. Physical assessment deferred to PCP.   Exercise Activities and Dietary recommendations Current Exercise Habits: The patient does not participate in regular exercise at present Diet (meal preparation, eat out, water intake, caffeinated beverages, dairy products, fruits and vegetables): well balanced Breakfast: cereal. coffee Lunch: toast Dinner: broccoli soup and salad      Goals    . Maintain independence       Fall Risk Fall Risk  03/30/2018 03/29/2017 10/27/2015 07/03/2014  Falls in the past year? No No No No    Depression Screen PHQ 2/9 Scores 03/30/2018 03/29/2017 10/09/2015 07/03/2014  PHQ - 2 Score 0 0 0 0    Cognitive Function MMSE - Mini Mental State Exam 03/29/2017  Orientation to time 5  Orientation to Place 5  Registration 3  Attention/ Calculation 4  Recall 2  Language- name 2 objects 2  Language- repeat 1  Language- follow 3 step command 3  Language- read & follow direction 1  Write a sentence 1  Copy design 0  Total score 27        Immunization History  Administered Date(s) Administered  . Influenza Split 08/26/2011, 08/08/2012  . Influenza Whole 09/21/2007, 08/20/2009, 08/04/2010  . Influenza, High Dose Seasonal PF 08/16/2016, 08/24/2017  . Influenza,inj,Quad PF,6+ Mos 08/16/2013, 09/19/2014, 08/07/2015  . Pneumococcal Conjugate-13  10/24/2014  . Pneumococcal Polysaccharide-23 08/31/2017  . Tdap 03/09/2008   Screening Tests Health Maintenance  Topic Date Due  . INFLUENZA VACCINE  06/08/2018  . TETANUS/TDAP  06/26/2024  . PNA vac Low Risk Adult  Completed    Plan:    Please schedule your next medicare wellness visit with me in 1 yr.  Continue to eat heart healthy diet (full of fruits, vegetables, whole grains, lean  protein, water--limit salt, fat, and sugar intake) and increase physical activity as tolerated.  Continue doing brain stimulating activities (puzzles, reading, adult coloring books, staying active) to keep memory sharp.    I have personally reviewed and noted the following in the patient's chart:   . Medical and social history . Use of alcohol, tobacco or illicit drugs  . Current medications and supplements . Functional ability and status . Nutritional status . Physical activity . Advanced directives . List of other physicians . Hospitalizations, surgeries, and ER visits in previous 12 months . Vitals . Screenings to include cognitive, depression, and falls . Referrals and appointments  In addition, I have reviewed and discussed with patient certain preventive protocols, quality metrics, and best practice recommendations. A written personalized care plan for preventive services as well as general preventive health recommendations were provided to patient.     Mady Haagensen [place], [place]  03/30/2018

## 2018-03-30 ENCOUNTER — Encounter: Payer: Self-pay | Admitting: *Deleted

## 2018-03-30 ENCOUNTER — Ambulatory Visit (INDEPENDENT_AMBULATORY_CARE_PROVIDER_SITE_OTHER): Payer: Medicare Other | Admitting: *Deleted

## 2018-03-30 VITALS — BP 124/70 | HR 73 | Ht 68.0 in | Wt 164.8 lb

## 2018-03-30 DIAGNOSIS — Z0000 Encounter for general adult medical examination without abnormal findings: Secondary | ICD-10-CM | POA: Diagnosis not present

## 2018-03-30 NOTE — Patient Instructions (Signed)
Please schedule your next medicare wellness visit with me in 1 yr.  Continue to eat heart healthy diet (full of fruits, vegetables, whole grains, lean protein, water--limit salt, fat, and sugar intake) and increase physical activity as tolerated.  Continue doing brain stimulating activities (puzzles, reading, adult coloring books, staying active) to keep memory sharp.    Bill Price , Thank you for taking time to come for your Medicare Wellness Visit. I appreciate your ongoing commitment to your health goals. Please review the following plan we discussed and let me know if I can assist you in the future.   These are the goals we discussed: Goals    . Maintain independence       This is a list of the screening recommended for you and due dates:  Health Maintenance  Topic Date Due  . Flu Shot  06/08/2018  . Tetanus Vaccine  06/26/2024  . Pneumonia vaccines  Completed

## 2018-04-07 NOTE — Progress Notes (Signed)
I have reviewed MWE by Ms. Britt and agree with her documentation  J Copland MD  

## 2018-10-13 ENCOUNTER — Ambulatory Visit (INDEPENDENT_AMBULATORY_CARE_PROVIDER_SITE_OTHER): Payer: Medicare Other

## 2018-10-13 DIAGNOSIS — Z23 Encounter for immunization: Secondary | ICD-10-CM

## 2018-10-18 ENCOUNTER — Encounter: Payer: Self-pay | Admitting: Family Medicine

## 2019-03-30 NOTE — Progress Notes (Addendum)
Virtual Visit via Video Note  I connected with patient on 04/03/19 at 11:00 AM EDT by audio only enabled telemedicine application and verified that I am speaking with the correct person using two identifiers.   THIS ENCOUNTER IS A VIRTUAL VISIT DUE TO COVID-19 - PATIENT WAS NOT SEEN IN THE OFFICE. PATIENT HAS CONSENTED TO VIRTUAL VISIT / TELEMEDICINE VISIT   Location of patient: home  Location of provider: office  I discussed the limitations of evaluation and management by telemedicine and the availability of in person appointments. The patient expressed understanding and agreed to proceed.   Subjective:   Bill Price is a 85 y.o. male who presents for Medicare Annual/Subsequent preventive examination.  Enjoys woodworking.  Review of Systems: No ROS.  Medicare Wellness Virtual Visit.  Visual/audio telehealth visit, UTA vital signs.   See social history for additional risk factors. Cardiac Risk Factors include: advanced age (>7355men, 4>65 women);dyslipidemia;male gender Sleep patterns:  No issues. Sleeps 8 hrs. Home Safety/Smoke Alarms: Feels safe in home. Smoke alarms in place.  Lives with wife on 1st floor on 2 story home. No children. Has chair lift.  Male:    PSA-  Lab Results  Component Value Date   PSA 0.47 03/21/2007   Eye- Dr.Groat every 2 years [place]    Objective:     Advanced Directives 04/03/2019 03/30/2018 03/29/2017 01/02/2015  Does Patient Have a Medical Advance Directive? No No Yes No  Type of Advance Directive - Web designer- Healthcare Power of Attorney;Living will -  Does patient want to make changes to medical advance directive? - - No - Patient declined -  Copy of Healthcare Power of Attorney in Chart? - - No - copy requested -  Would patient like information on creating a medical advance directive? No - Patient declined No - Patient declined - Yes - Transport plannerducational materials given    Tobacco Social History   Tobacco Use  Smoking Status Never Smoker   Smokeless Tobacco Never Used     Counseling given: Not Answered   Clinical Intake:     Pain : No/denies pain                 Past Medical History:  Diagnosis Date  . Allergic rhinitis   . Anxiety   . Arthritis   . Colon polyp   . GERD (gastroesophageal reflux disease)   . Hemorrhoids   . Hyperlipidemia   . IBS (irritable bowel syndrome)   . Impotence   . Kidney stone   . Orthostatic hypotension   . Osteopenia   . PVD (peripheral vascular disease) (HCC)   . Urinary obstruction    Past Surgical History:  Procedure Laterality Date  . COLONOSCOPY  2007  . HERNIA REPAIR  2005   Family History  Problem Relation Age of Onset  . Prostate cancer Brother   . Colon cancer Neg Hx   . Colon polyps Neg Hx   . Diabetes Neg Hx   . Kidney disease Neg Hx   . Esophageal cancer Neg Hx   . Gallbladder disease Neg Hx   . Heart disease Neg Hx    Social History   Socioeconomic History  . Marital status: Married    Spouse name: Not on file  . Number of children: 0  . Years of education: Not on file  . Highest education level: Not on file  Occupational History  . Occupation: Retired  Engineer, productionocial Needs  . Financial resource strain: Not on file  .  Food insecurity:    Worry: Not on file    Inability: Not on file  . Transportation needs:    Medical: Not on file    Non-medical: Not on file  Tobacco Use  . Smoking status: Never Smoker  . Smokeless tobacco: Never Used  Substance and Sexual Activity  . Alcohol use: Yes    Alcohol/week: 0.0 standard drinks    Comment: 1 glass of wine a day  . Drug use: No  . Sexual activity: Never  Lifestyle  . Physical activity:    Days per week: Not on file    Minutes per session: Not on file  . Stress: Not on file  Relationships  . Social connections:    Talks on phone: Not on file    Gets together: Not on file    Attends religious service: Not on file    Active member of club or organization: Not on file    Attends meetings of  clubs or organizations: Not on file    Relationship status: Not on file  Other Topics Concern  . Not on file  Social History Narrative  . Not on file    No outpatient encounter medications on file as of 04/03/2019.   No facility-administered encounter medications on file as of 04/03/2019.     Activities of Daily Living In your present state of health, do you have any difficulty performing the following activities: 04/03/2019  Hearing? Y  Comment wears hears aids  Vision? N  Comment wears bifocals  Difficulty concentrating or making decisions? N  Walking or climbing stairs? N  Dressing or bathing? N  Doing errands, shopping? N  Preparing Food and eating ? N  Using the Toilet? N  In the past six months, have you accidently leaked urine? N  Do you have problems with loss of bowel control? N  Managing your Medications? N  Managing your Finances? N  Housekeeping or managing your Housekeeping? N  Some recent data might be hidden    Patient Care Team: Copland, Gwenlyn Found, MD as PCP - General (Family Medicine)   Assessment:   This is a routine wellness examination for [name]. Physical assessment deferred to PCP.  Exercise Activities and Dietary recommendations Current Exercise Habits: The patient does not participate in regular exercise at present, Exercise limited by: None identified Diet (meal preparation, eat out, water intake, caffeinated beverages, dairy products, fruits and vegetables): in general, a "healthy" diet  , well balanced, on average, 3 meals per day  Goals    . maintain current healthy lifestyle. (pt-stated)    . Maintain independence       Fall Risk Fall Risk  04/03/2019 03/30/2018 03/29/2017 10/27/2015 07/03/2014  Falls in the past year? 0 No No No No     Depression Screen PHQ 2/9 Scores 04/03/2019 03/30/2018 03/29/2017 10/09/2015  PHQ - 2 Score 0 0 0 0    Cognitive Function Ad8 score reviewed for issues:  Issues making decisions:no  Less interest in  hobbies / activities:no  Repeats questions, stories (family complaining):no  Trouble using ordinary gadgets (microwave, computer, phone):no  Forgets the month or year: no  Mismanaging finances: no  Remembering appts:no  Daily problems with thinking and/or memory:no Ad8 score is=0    MMSE - Mini Mental State Exam 03/29/2017  Orientation to time 5  Orientation to Place 5  Registration 3  Attention/ Calculation 4  Recall 2  Language- name 2 objects 2  Language- repeat 1  Language- follow 3  step command 3  Language- read & follow direction 1  Write a sentence 1  Copy design 0  Total score 27        Immunization History  Administered Date(s) Administered  . Influenza Split 08/26/2011, 08/08/2012  . Influenza Whole 09/21/2007, 08/20/2009, 08/04/2010  . Influenza, High Dose Seasonal PF 08/16/2016, 08/24/2017, 10/13/2018  . Influenza,inj,Quad PF,6+ Mos 08/16/2013, 09/19/2014, 08/07/2015  . Pneumococcal Conjugate-13 10/24/2014  . Pneumococcal Polysaccharide-23 08/31/2017  . Tdap 03/09/2008    Screening Tests Health Maintenance  Topic Date Due  . INFLUENZA VACCINE  06/09/2019  . TETANUS/TDAP  06/26/2024  . PNA vac Low Risk Adult  Completed         Plan:   See you next year  Continue to eat heart healthy diet (full of fruits, vegetables, whole grains, lean protein, water--limit salt, fat, and sugar intake) and increase physical activity as tolerated.  Continue doing brain stimulating activities (puzzles, reading, adult coloring books, staying active) to keep memory sharp.     I have personally reviewed and noted the following in the patient's chart:   . Medical and social history . Use of alcohol, tobacco or illicit drugs  . Current medications and supplements . Functional ability and status . Nutritional status . Physical activity . Advanced directives . List of other physicians . Hospitalizations, surgeries, and ER visits in previous 12 months . Vitals  . Screenings to include cognitive, depression, and falls . Referrals and appointments  In addition, I have reviewed and discussed with patient certain preventive protocols, quality metrics, and best practice recommendations. A written personalized care plan for preventive services as well as general preventive health recommendations were provided to patient.     Avon Gully, RN  04/03/2019  I have reviewed the above MWE note by Ms. Moshe Cipro and agree with her documentation  Arthor Captain MD

## 2019-04-03 ENCOUNTER — Encounter: Payer: Self-pay | Admitting: *Deleted

## 2019-04-03 ENCOUNTER — Other Ambulatory Visit: Payer: Self-pay

## 2019-04-03 ENCOUNTER — Ambulatory Visit (INDEPENDENT_AMBULATORY_CARE_PROVIDER_SITE_OTHER): Payer: Medicare Other | Admitting: *Deleted

## 2019-04-03 DIAGNOSIS — Z0000 Encounter for general adult medical examination without abnormal findings: Secondary | ICD-10-CM | POA: Diagnosis not present

## 2019-04-03 NOTE — Patient Instructions (Addendum)
See you next year  Continue to eat heart healthy diet (full of fruits, vegetables, whole grains, lean protein, water--limit salt, fat, and sugar intake) and increase physical activity as tolerated.  Continue doing brain stimulating activities (puzzles, reading, adult coloring books, staying active) to keep memory sharp.    [name] , Thank you for taking time to come for your Medicare Wellness Visit. I appreciate your ongoing commitment to your health goals. Please review the following plan we discussed and let me know if I can assist you in the future.   These are the goals we discussed: Goals    . maintain current healthy lifestyle. (pt-stated)    . Maintain independence       This is a list of the screening recommended for you and due dates:  Health Maintenance  Topic Date Due  . Flu Shot  06/09/2019  . Tetanus Vaccine  06/26/2024  . Pneumonia vaccines  Completed    Health Maintenance After Age 83 After age 83, you are at a higher risk for certain long-term diseases and infections as well as injuries from falls. Falls are a major cause of broken bones and head injuries in people who are older than age 83. Getting regular preventive care can help to keep you healthy and well. Preventive care includes getting regular testing and making lifestyle changes as recommended by your health care provider. Talk with your health care provider about:  Which screenings and tests you should have. A screening is a test that checks for a disease when you have no symptoms.  A diet and exercise plan that is right for you. What should I know about screenings and tests to prevent falls? Screening and testing are the best ways to find a health problem early. Early diagnosis and treatment give you the best chance of managing medical conditions that are common after age 83. Certain conditions and lifestyle choices may make you more likely to have a fall. Your health care provider may recommend:  Regular  vision checks. Poor vision and conditions such as cataracts can make you more likely to have a fall. If you wear glasses, make sure to get your prescription updated if your vision changes.  Medicine review. Work with your health care provider to regularly review all of the medicines you are taking, including over-the-counter medicines. Ask your health care provider about any side effects that may make you more likely to have a fall. Tell your health care provider if any medicines that you take make you feel dizzy or sleepy.  Osteoporosis screening. Osteoporosis is a condition that causes the bones to get weaker. This can make the bones weak and cause them to break more easily.  Blood pressure screening. Blood pressure changes and medicines to control blood pressure can make you feel dizzy.  Strength and balance checks. Your health care provider may recommend certain tests to check your strength and balance while standing, walking, or changing positions.  Foot health exam. Foot pain and numbness, as well as not wearing proper footwear, can make you more likely to have a fall.  Depression screening. You may be more likely to have a fall if you have a fear of falling, feel emotionally low, or feel unable to do activities that you used to do.  Alcohol use screening. Using too much alcohol can affect your balance and may make you more likely to have a fall. What actions can I take to lower my risk of falls? General instructions  Talk with your health care provider about your risks for falling. Tell your health care provider if: ? You fall. Be sure to tell your health care provider about all falls, even ones that seem minor. ? You feel dizzy, sleepy, or off-balance.  Take over-the-counter and prescription medicines only as told by your health care provider. These include any supplements.  Eat a healthy diet and maintain a healthy weight. A healthy diet includes low-fat dairy products, low-fat  (lean) meats, and fiber from whole grains, beans, and lots of fruits and vegetables. Home safety  Remove any tripping hazards, such as rugs, cords, and clutter.  Install safety equipment such as grab bars in bathrooms and safety rails on stairs.  Keep rooms and walkways well-lit. Activity   Follow a regular exercise program to stay fit. This will help you maintain your balance. Ask your health care provider what types of exercise are appropriate for you.  If you need a cane or walker, use it as recommended by your health care provider.  Wear supportive shoes that have nonskid soles. Lifestyle  Do not drink alcohol if your health care provider tells you not to drink.  If you drink alcohol, limit how much you have: ? 0-1 drink a day for women. ? 0-2 drinks a day for men.  Be aware of how much alcohol is in your drink. In the [place] one drink equals one typical bottle of beer (12 oz), one-half glass of wine (5 oz), or one shot of hard liquor (1 oz).  Do not use any products that contain nicotine or tobacco, such as cigarettes and e-cigarettes. If you need help quitting, ask your health care provider. Summary  Having a healthy lifestyle and getting preventive care can help to protect your health and wellness after age 5.  Screening and testing are the best way to find a health problem early and help you avoid having a fall. Early diagnosis and treatment give you the best chance for managing medical conditions that are more common for people who are older than age 85.  Falls are a major cause of broken bones and head injuries in people who are older than age 88. Take precautions to prevent a fall at home.  Work with your health care provider to learn what changes you can make to improve your health and wellness and to prevent falls. This information is not intended to replace advice given to you by your health care provider. Make sure you discuss any questions you have with your  health care provider. Document Released: 09/07/2017 Document Revised: 09/07/2017 Document Reviewed: 09/07/2017 Elsevier Interactive Patient Education  2019 [employer].  Health Maintenance After Age 6 After age 48, you are at a higher risk for certain long-term diseases and infections as well as injuries from falls. Falls are a major cause of broken bones and head injuries in people who are older than age 58. Getting regular preventive care can help to keep you healthy and well. Preventive care includes getting regular testing and making lifestyle changes as recommended by your health care provider. Talk with your health care provider about:  Which screenings and tests you should have. A screening is a test that checks for a disease when you have no symptoms.  A diet and exercise plan that is right for you. What should I know about screenings and tests to prevent falls? Screening and testing are the best ways to find a health problem early. Early diagnosis and treatment give you  the best chance of managing medical conditions that are common after age 59. Certain conditions and lifestyle choices may make you more likely to have a fall. Your health care provider may recommend:  Regular vision checks. Poor vision and conditions such as cataracts can make you more likely to have a fall. If you wear glasses, make sure to get your prescription updated if your vision changes.  Medicine review. Work with your health care provider to regularly review all of the medicines you are taking, including over-the-counter medicines. Ask your health care provider about any side effects that may make you more likely to have a fall. Tell your health care provider if any medicines that you take make you feel dizzy or sleepy.  Osteoporosis screening. Osteoporosis is a condition that causes the bones to get weaker. This can make the bones weak and cause them to break more easily.  Blood pressure screening. Blood  pressure changes and medicines to control blood pressure can make you feel dizzy.  Strength and balance checks. Your health care provider may recommend certain tests to check your strength and balance while standing, walking, or changing positions.  Foot health exam. Foot pain and numbness, as well as not wearing proper footwear, can make you more likely to have a fall.  Depression screening. You may be more likely to have a fall if you have a fear of falling, feel emotionally low, or feel unable to do activities that you used to do.  Alcohol use screening. Using too much alcohol can affect your balance and may make you more likely to have a fall. What actions can I take to lower my risk of falls? General instructions  Talk with your health care provider about your risks for falling. Tell your health care provider if: ? You fall. Be sure to tell your health care provider about all falls, even ones that seem minor. ? You feel dizzy, sleepy, or off-balance.  Take over-the-counter and prescription medicines only as told by your health care provider. These include any supplements.  Eat a healthy diet and maintain a healthy weight. A healthy diet includes low-fat dairy products, low-fat (lean) meats, and fiber from whole grains, beans, and lots of fruits and vegetables. Home safety  Remove any tripping hazards, such as rugs, cords, and clutter.  Install safety equipment such as grab bars in bathrooms and safety rails on stairs.  Keep rooms and walkways well-lit. Activity   Follow a regular exercise program to stay fit. This will help you maintain your balance. Ask your health care provider what types of exercise are appropriate for you.  If you need a cane or walker, use it as recommended by your health care provider.  Wear supportive shoes that have nonskid soles. Lifestyle  Do not drink alcohol if your health care provider tells you not to drink.  If you drink alcohol, limit how  much you have: ? 0-1 drink a day for women. ? 0-2 drinks a day for men.  Be aware of how much alcohol is in your drink. In the [place] one drink equals one typical bottle of beer (12 oz), one-half glass of wine (5 oz), or one shot of hard liquor (1 oz).  Do not use any products that contain nicotine or tobacco, such as cigarettes and e-cigarettes. If you need help quitting, ask your health care provider. Summary  Having a healthy lifestyle and getting preventive care can help to protect your health and wellness after age 32.  Screening and testing are the best way to find a health problem early and help you avoid having a fall. Early diagnosis and treatment give you the best chance for managing medical conditions that are more common for people who are older than age 55.  Falls are a major cause of broken bones and head injuries in people who are older than age 31. Take precautions to prevent a fall at home.  Work with your health care provider to learn what changes you can make to improve your health and wellness and to prevent falls. This information is not intended to replace advice given to you by your health care provider. Make sure you discuss any questions you have with your health care provider. Document Released: 09/07/2017 Document Revised: 09/07/2017 Document Reviewed: 09/07/2017 Elsevier Interactive Patient Education  2019 [employer].

## 2019-04-03 NOTE — Progress Notes (Deleted)
Bill Price [address] [place], [place] [address] [phone]  Date:  04/04/2019   Name:  Bill Price   DOB:  [birth year]   MRN:  [id number]  PCP:  Pearline Cables, MD    Chief Complaint: No chief complaint on file.   History of Present Illness:  Bill Price is a 85 y.o. very pleasant male patient who presents with the following:  Virtual visit today, phone call only-  No show.   I last saw this patient in 2018 Elderly gentleman with history of peripheral vascular disease, GERD, hyperlipidemia Most recent labs in 2018  Married to Bill Price, who is 83 years old and was recently diagnosed with breast cancer.  Patient Active Problem List   Diagnosis Date Noted  . 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 ACTINIC KERATOSIS, HEAD 10/05/2010  . ANXIETY 06/04/2010  . IRRITABLE BOWEL SYNDROME 04/15/2010  . PERIPHERAL VASCULAR DISEASE 09/10/2009  . ALLERGIC RHINITIS 09/10/2009  . GERD 04/22/2009  . ARTHRITIS, HIP 05/15/2008  . OSTEOPENIA 01/17/2008  . HYPERLIPIDEMIA 09/21/2007  . RENAL CALCULUS, HX OF 09/21/2007    Past Medical History:  Diagnosis Date  . Allergic rhinitis   . Anxiety   . Arthritis   . Colon polyp   . GERD (gastroesophageal reflux disease)   . Hemorrhoids   . Hyperlipidemia   . IBS (irritable bowel syndrome)   . Impotence   . Kidney stone   . Orthostatic hypotension   . Osteopenia   . PVD (peripheral vascular disease) (HCC)   . Urinary obstruction     Past Surgical History:  Procedure Laterality Date  . COLONOSCOPY  2007  . HERNIA REPAIR  2005    Social History   Tobacco Use  . Smoking status: Never Smoker  . Smokeless tobacco: Never Used  Substance Use Topics  . Alcohol use: Yes    Alcohol/week: 0.0 standard drinks    Comment: 1 glass of wine a day  . Drug use: No    Family History  Problem Relation Age of Onset  . Prostate cancer Brother   . Colon cancer  Neg Hx   . Colon polyps Neg Hx   . Diabetes Neg Hx   . Kidney disease Neg Hx   . Esophageal cancer Neg Hx   . Gallbladder disease Neg Hx   . Heart disease Neg Hx     Allergies  Allergen Reactions  . Augmentin [Amoxicillin-Pot Clavulanate] Diarrhea  . Crestor [Rosuvastatin]     Shaky, hot flashes    Medication list has been reviewed and updated.  No current outpatient medications on file prior to visit.   No current facility-administered medications on file prior to visit.     Review of Systems:  As per HPI- otherwise negative.   Physical Examination: There were no vitals filed for this visit. There were no vitals filed for this visit. There is no height or weight on file to calculate BMI. Ideal Body Weight:    ***  Assessment and Plan: *** Can recommend shingles vaccine Signed Abbe [place], MD

## 2019-04-04 ENCOUNTER — Ambulatory Visit: Payer: Medicare Other | Admitting: Family Medicine

## 2019-04-04 ENCOUNTER — Encounter: Payer: Self-pay | Admitting: Family Medicine

## 2019-04-04 ENCOUNTER — Other Ambulatory Visit: Payer: Self-pay

## 2019-06-01 DIAGNOSIS — N400 Enlarged prostate without lower urinary tract symptoms: Secondary | ICD-10-CM | POA: Diagnosis not present

## 2019-09-12 DIAGNOSIS — Z23 Encounter for immunization: Secondary | ICD-10-CM | POA: Diagnosis not present

## 2020-01-06 NOTE — Patient Instructions (Addendum)
It was good to see you again today, I will be in touch with your labs as soon as possible  We are so sorry for the recent loss of your wife  [facility]- apparently you need to have a primary care doctor through the [place] to be seen by their audiology clinic.

## 2020-01-06 NOTE — Progress Notes (Addendum)
[place] [facility] [address], [place], [place] [address] [phone]  Date:  01/07/2020   Name:  Bill Price   DOB:  05/24/1929   MRN:  [id number]  PCP:  Darreld Mclean, MD    Chief Complaint: Hearing Loss (needing new hearing aids)   History of Present Illness:  Bill Price is a 86 y.o. 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  He lost his wife of many years, Bill Price, in the last few months.  They were married for 53 years.   History of hyperlipidemia, IBS, PVD, GERD Last seen by myself for a visit in 2018 Today shows me several photos of very nice homemade wooden toys that he crafts at home.  His urologist is Dr. Diona Fanti Needs routine labs today Flu shot done already He is scheduled for his covid shot through the [place] later this month   He has had his current set of hearing aids for some time, notes they are not working as well as they used to and he would like to update.  He is a patient of [facility]-he reports that he needs a referral to their audiology clinic for updated hearing aids  Patient Active Problem List   Diagnosis Date Noted  . 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 ACTINIC KERATOSIS, HEAD 10/05/2010  . ANXIETY 06/04/2010  . IRRITABLE BOWEL SYNDROME 04/15/2010  . PERIPHERAL VASCULAR DISEASE 09/10/2009  . ALLERGIC RHINITIS 09/10/2009  . GERD 04/22/2009  . ARTHRITIS, HIP 05/15/2008  . OSTEOPENIA 01/17/2008  . HYPERLIPIDEMIA 09/21/2007  . RENAL CALCULUS, HX OF 09/21/2007    Past Medical History:  Diagnosis Date  . Allergic rhinitis   . Anxiety   . Arthritis   . Colon polyp   . GERD (gastroesophageal reflux disease)   . Hemorrhoids   . Hyperlipidemia   . IBS (irritable bowel syndrome)   . Impotence   . Kidney stone   . Orthostatic  hypotension   . Osteopenia   . PVD (peripheral vascular disease) ([place])   . Urinary obstruction     Past Surgical History:  Procedure Laterality Date  . COLONOSCOPY  2007  . HERNIA REPAIR  2005    Social History   Tobacco Use  . Smoking status: Never Smoker  . Smokeless tobacco: Never Used  Substance Use Topics  . Alcohol use: Yes    Alcohol/week: 0.0 standard drinks    Comment: 1 glass of wine a day  . Drug use: No    Family History  Problem Relation Age of Onset  . Prostate cancer Brother   . Colon cancer Neg Hx   . Colon polyps Neg Hx   . Diabetes Neg Hx   . Kidney disease Neg Hx   . Esophageal cancer Neg Hx   . Gallbladder disease Neg Hx   . Heart disease Neg Hx     Allergies  Allergen Reactions  . Augmentin [Amoxicillin-Pot Clavulanate] Diarrhea  . Crestor [Rosuvastatin]     Shaky, hot flashes    Medication list has been reviewed and updated.  No current outpatient medications on file prior to visit.   No current facility-administered medications on file prior to visit.    Review of Systems:  As per HPI- otherwise negative.   Physical Examination: Vitals:  01/07/20 1400  BP: 120/70  Pulse: 78  Resp: 19  Temp: (!) 96.6 F (35.9 C)  SpO2: 98%   Vitals:   01/07/20 1400  Weight: 173 lb (78.5 kg)  Height: 5\' 8"  (1.727 m)   Body mass index is 26.3 kg/m. Ideal Body Weight: Weight in (lb) to have BMI = 25: 164.1  GEN: no acute distress.  HOH, looks well for age  HEENT: Atraumatic, Normocephalic.  Ears and Nose: No external deformity. CV: RRR, No M/G/R. No JVD. No thrill. No extra heart sounds. PULM: CTA B, no wheezes, crackles, rhonchi. No retractions. No resp. distress. No accessory muscle use. EXTR: No c/c/e PSYCH: Normally interactive. Conversant.    Assessment and Plan: Bilateral hearing loss, unspecified hearing loss type  Screening for deficiency anemia - Plan: CBC  Screening for diabetes mellitus - Plan: Comprehensive  metabolic panel  Hyperlipidemia, unspecified hyperlipidemia type - Plan: Lipid panel  Patient following up today for routine visit.  Labs pain as above.  Offered my condolences on the loss of his wife  I called the [facility] audiology clinic, was told by the receptionist that he "needs to get a referral to [facility] from [facility]."  I advised the receptionist that the patient does not have a PCP through the [facility], she stated that he will need to establish primary care through the [place] prior to seeing their audiology clinic.  I relayed this information to the patient, advised that we can have him seek hearing aids through our community audiologist.  [facility], will let me know what he finds out.  I am glad to provide any further necessary assistance Will plan further follow- up pending labs.  This visit occurred during the SARS-CoV-2 public health emergency.  Safety protocols were in place, including screening questions prior to the visit, additional usage of staff PPE, and extensive cleaning of exam room while observing appropriate contact time as indicated for disinfecting solutions.    Signed [place], MD  Received his labs 3/2  Letter to patient  Results for orders placed or performed in visit on 01/07/20  CBC  Result Value Ref Range   WBC 5.4 4.0 - 10.5 K/uL   RBC 3.68 (L) 4.22 - 5.81 Mil/uL   Platelets 134.0 (L) 150.0 - 400.0 K/uL   Hemoglobin 12.3 (L) 13.0 - 17.0 g/dL   HCT 03/08/20 (L) 56.3 - 87.5 %   MCV 99.5 78.0 - 100.0 fl   MCHC 33.7 30.0 - 36.0 g/dL   RDW 64.3 32.9 - 51.8 %  Comprehensive metabolic panel  Result Value Ref Range   Sodium 138 135 - 145 mEq/L   Potassium 4.6 3.5 - 5.1 mEq/L   Chloride 102 96 - 112 mEq/L   CO2 29 19 - 32 mEq/L   Glucose, Bld 113 (H) 70 - 99 mg/dL   BUN 20 6 - 23 mg/dL   Creatinine, Ser 84.1 0.40 - 1.50 mg/dL   Total Bilirubin 0.4 0.2 - 1.2 mg/dL   Alkaline Phosphatase 80 39 - 117  U/L   AST 19 0 - 37 U/L   ALT 11 0 - 53 U/L   Total Protein 6.4 6.0 - 8.3 g/dL   Albumin 3.6 3.5 - 5.2 g/dL   GFR 6.60 63.01 mL/min   Calcium 9.2 8.4 - 10.5 mg/dL  Lipid panel  Result Value Ref Range   Cholesterol 183 0 - 200 mg/dL   Triglycerides >60.10 0.0 -  149.0 mg/dL   HDL 28.36 >62.94 mg/dL   VLDL 76.5 0.0 - 46.5 mg/dL   LDL Cholesterol 035 (H) 0 - 99 mg/dL   Total CHOL/HDL Ratio 3    NonHDL 125.90

## 2020-01-07 ENCOUNTER — Encounter: Payer: Self-pay | Admitting: Family Medicine

## 2020-01-07 ENCOUNTER — Ambulatory Visit (INDEPENDENT_AMBULATORY_CARE_PROVIDER_SITE_OTHER): Payer: Medicare Other | Admitting: Family Medicine

## 2020-01-07 ENCOUNTER — Other Ambulatory Visit: Payer: Self-pay

## 2020-01-07 VITALS — BP 120/70 | HR 78 | Temp 96.6°F | Resp 19 | Ht 68.0 in | Wt 173.0 lb

## 2020-01-07 DIAGNOSIS — Z131 Encounter for screening for diabetes mellitus: Secondary | ICD-10-CM | POA: Diagnosis not present

## 2020-01-07 DIAGNOSIS — Z130 Encounter for screening for diseases of the blood and blood-forming organs and certain disorders involving the immune mechanism: Secondary | ICD-10-CM | POA: Diagnosis not present

## 2020-01-07 DIAGNOSIS — H9193 Unspecified hearing loss, bilateral: Secondary | ICD-10-CM

## 2020-01-07 DIAGNOSIS — E785 Hyperlipidemia, unspecified: Secondary | ICD-10-CM | POA: Diagnosis not present

## 2020-01-08 LAB — LIPID PANEL
Cholesterol: 183 mg/dL (ref 0–200)
HDL: 57 mg/dL (ref >39.00)
LDL Cholesterol: 105 mg/dL — ABNORMAL HIGH (ref 0–99)
NonHDL: 125.9
Total CHOL/HDL Ratio: 3
Triglycerides: 103 mg/dL (ref 0.0–149.0)
VLDL: 20.6 mg/dL (ref 0.0–40.0)

## 2020-01-08 LAB — CBC
HCT: 36.6 % — ABNORMAL LOW (ref 39.0–52.0)
Hemoglobin: 12.3 g/dL — ABNORMAL LOW (ref 13.0–17.0)
MCHC: 33.7 g/dL (ref 30.0–36.0)
MCV: 99.5 fl (ref 78.0–100.0)
Platelets: 134 10*3/uL — ABNORMAL LOW (ref 150.0–400.0)
RBC: 3.68 Mil/uL — ABNORMAL LOW (ref 4.22–5.81)
RDW: 12.9 % (ref 11.5–15.5)
WBC: 5.4 10*3/uL (ref 4.0–10.5)

## 2020-01-08 LAB — COMPREHENSIVE METABOLIC PANEL
ALT: 11 U/L (ref 0–53)
AST: 19 U/L (ref 0–37)
Albumin: 3.6 g/dL (ref 3.5–5.2)
Alkaline Phosphatase: 80 U/L (ref 39–117)
BUN: 20 mg/dL (ref 6–23)
CO2: 29 mEq/L (ref 19–32)
Calcium: 9.2 mg/dL (ref 8.4–10.5)
Chloride: 102 mEq/L (ref 96–112)
Creatinine, Ser: 0.93 mg/dL (ref 0.40–1.50)
GFR: 76.49 mL/min (ref >60.00)
Glucose, Bld: 113 mg/dL — ABNORMAL HIGH (ref 70–99)
Potassium: 4.6 mEq/L (ref 3.5–5.1)
Sodium: 138 mEq/L (ref 135–145)
Total Bilirubin: 0.4 mg/dL (ref 0.2–1.2)
Total Protein: 6.4 g/dL (ref 6.0–8.3)

## 2020-04-16 NOTE — Progress Notes (Signed)
Subjective:   Bill Price is a 86 y.o. male who presents for Medicare Annual/Subsequent preventive examination.  Review of Systems:   Cardiac Risk Factors include: advanced age (>69men, >60 women);dyslipidemia;sedentary lifestyle;male gender     Objective:    Vitals: BP 138/80 (BP Location: Left Arm, Patient Position: Sitting, Cuff Size: Normal)   Pulse 67   Resp 16   Ht 5\' 8"  (1.727 m)   Wt 179 lb (81.2 kg)   SpO2 98%   BMI 27.22 kg/m   Body mass index is 27.22 kg/m.  Advanced Directives 04/17/2020 04/03/2019 03/30/2018 03/29/2017 01/02/2015  Does Patient Have a Medical Advance Directive? Yes No No Yes No  Type of Paramedic of [place];Living will - - [place];Living will -  Does patient want to make changes to medical advance directive? - - - No - Patient declined -  Copy of [place] in Chart? No - copy requested - - No - copy requested -  Would patient like information on creating a medical advance directive? - No - Patient declined No - Patient declined - Yes - Scientist, clinical (histocompatibility and immunogenetics) given    Tobacco Social History   Tobacco Use  Smoking Status Never Smoker  Smokeless Tobacco Never Used     Counseling given: Not Answered   Clinical Intake:  Pre-visit preparation completed: No  Pain : No/denies pain     Nutritional Status: BMI 25 -29 Overweight Nutritional Risks: Unintentional weight gain (10 ound weight gain over the past year) Diabetes: No  How often do you need to have someone help you when you read instructions, pamphlets, or other written materials from your doctor or pharmacy?: 1 - Never  Interpreter Needed?: No  Information entered by :: Caroleen Hamman LPN  Past Medical History:  Diagnosis Date  . Allergic rhinitis   . Anxiety   . Arthritis   . Colon polyp   . GERD (gastroesophageal reflux disease)   . Hemorrhoids   . Hyperlipidemia   . IBS (irritable bowel syndrome)   .  Impotence   . Kidney stone   . Orthostatic hypotension   . Osteopenia   . PVD (peripheral vascular disease) ([employer])   . Urinary obstruction    Past Surgical History:  Procedure Laterality Date  . COLONOSCOPY  2007  . HERNIA REPAIR  2005   Family History  Problem Relation Age of Onset  . Prostate cancer Brother   . Colon cancer Neg Hx   . Colon polyps Neg Hx   . Diabetes Neg Hx   . Kidney disease Neg Hx   . Esophageal cancer Neg Hx   . Gallbladder disease Neg Hx   . Heart disease Neg Hx    Social History   Socioeconomic History  . Marital status: Widowed    Spouse name: Not on file  . Number of children: 0  . Years of education: Not on file  . Highest education level: Not on file  Occupational History  . Occupation: Retired  Tobacco Use  . Smoking status: Never Smoker  . Smokeless tobacco: Never Used  Substance and Sexual Activity  . Alcohol use: Yes    Alcohol/week: 0.0 standard drinks    Comment: 1 glass of wine a day  . Drug use: No  . Sexual activity: Never  Other Topics Concern  . Not on file  Social History Narrative  . Not on file   Social Determinants of Health   Financial Resource  Strain: Low Risk   . Difficulty of Paying Living Expenses: Not hard at all  Food Insecurity: No Food Insecurity  . Worried About Programme researcher, broadcasting/film/video in the Last Year: Never true  . Ran Out of Food in the Last Year: Never true  Transportation Needs: No Transportation Needs  . Lack of Transportation (Medical): No  . Lack of Transportation (Non-Medical): No  Physical Activity: Inactive  . Days of Exercise per Week: 0 days  . Minutes of Exercise per Session: 0 min  Stress: No Stress Concern Present  . Feeling of Stress : Not at all  Social Connections: Moderately Isolated  . Frequency of Communication with Friends and Family: Once a week  . Frequency of Social Gatherings with Friends and Family: Once a week  . Attends Religious Services: More than 4 times per year  .  Active Member of Clubs or Organizations: Yes  . Attends Banker Meetings: More than 4 times per year  . Marital Status: Widowed    No outpatient encounter medications on file as of 04/17/2020.   No facility-administered encounter medications on file as of 04/17/2020.    Activities of Daily Living In your present state of health, do you have any difficulty performing the following activities: 04/17/2020  Hearing? Y  Vision? N  Difficulty concentrating or making decisions? Y  Comment sometimes trouble remembering since hsi wife died  Walking or climbing stairs? N  Dressing or bathing? N  Doing errands, shopping? N  Preparing Food and eating ? N  Using the Toilet? N  In the past six months, have you accidently leaked urine? N  Do you have problems with loss of bowel control? N  Managing your Medications? N  Managing your Finances? N  Housekeeping or managing your Housekeeping? N  Some recent data might be hidden    Patient Care Team: Copland, Gwenlyn Found, MD as PCP - General (Family Medicine)   Assessment:   This is a routine wellness examination for [place].  Exercise Activities and Dietary recommendations Current Exercise Habits: The patient does not participate in regular exercise at present, Exercise limited by: None identified  Goals Addressed   None     Fall Risk: Fall Risk  04/17/2020 04/03/2019 03/30/2018 03/29/2017 10/27/2015  Falls in the past year? 1 0 No No No  Number falls in past yr: 1 - - - -  Injury with Fall? 0 - - - -  Risk for fall due to : History of fall(s) - - - -  Follow up Falls evaluation completed;Falls prevention discussed - - - -    FALL RISK PREVENTION PERTAINING TO THE HOME:  Any stairs in or around the home? Yes  If so, are there any without handrails? No   Home free of loose throw rugs in walkways, pet beds, electrical cords, etc? Yes  Adequate lighting in your home to reduce risk of falls? Yes   ASSISTIVE DEVICES UTILIZED  TO PREVENT FALLS:  Life alert? No  Use of a cane, walker or w/c? No  Grab bars in the bathroom? Yes  Shower chair or bench in shower? No  Elevated toilet seat or a handicapped toilet? No   TIMED UP AND GO:  Was the test performed? Yes .  Length of time to ambulate 10 feet: 11 sec.   GAIT:  Appearance of gait: Gait slow and steady without the use of an assistive device.  Education: Fall risk prevention has been discussed.  Intervention(s) required?  Yes  DME/home health order needed?  No   Depression Screen PHQ 2/9 Scores 04/17/2020 04/03/2019 03/30/2018 03/29/2017  PHQ - 2 Score 0 0 0 0    Cognitive Function MMSE - Mini Mental State Exam 03/29/2017  Orientation to time 5  Orientation to Place 5  Registration 3  Attention/ Calculation 4  Recall 2  Language- name 2 objects 2  Language- repeat 1  Language- follow 3 step command 3  Language- read & follow direction 1  Write a sentence 1  Copy design 0  Total score 27  Patient does woodworking & plays word search frequently.   6CIT Screen 04/17/2020  What Year? 0 points  What month? 0 points  What time? 0 points  Count back from 20 0 points  Months in reverse 0 points  Repeat phrase 0 points  Total Score 0    Immunization History  Administered Date(s) Administered  . Influenza Split 08/26/2011, 08/08/2012  . Influenza Whole 09/21/2007, 08/20/2009, 08/04/2010  . Influenza, High Dose Seasonal PF 08/16/2016, 08/24/2017, 10/13/2018  . Influenza,inj,Quad PF,6+ Mos 08/16/2013, 09/19/2014, 08/07/2015  . Moderna SARS-COVID-2 Vaccination 01/23/2020, 02/20/2020  . Pneumococcal Conjugate-13 10/24/2014  . Pneumococcal Polysaccharide-23 08/31/2017  . Tdap 03/09/2008    Qualifies for Shingles Vaccine? Yes    Due for Shingrix. Education has been provided regarding the importance of this vaccine. Pt has been advised to call insurance company to determine out of pocket expense. Advised may also receive vaccine at local pharmacy or  Health Dept. Verbalized acceptance and understanding.  Tdap: Last completed 03/09/2008  Flu Vaccine: Due 07/2020  Pneumococcal Vaccine:   Completed Prevnar 13-12/17/2015 Completed Pneumovax 23-10/24/2018  Covid-19 Vaccine: Completed vaccines-Moderna-02/20/2020  Screening Tests Health Maintenance  Topic Date Due  . INFLUENZA VACCINE  06/08/2020  . TETANUS/TDAP  06/26/2024  . COVID-19 Vaccine  Completed  . PNA vac Low Risk Adult  Completed   Cancer Screenings:  Colorectal Screening: Not indicated due to age   Lung Cancer Screening: (Low Dose CT Chest recommended if Age 30-80 years, 30 pack-year currently smoking OR have quit w/in 15years.) does not qualify.    Additional Screening:  Hepatitis C Screening: does not qualify.  Vision Screening: Recommended annual ophthalmology exams for early detection of glaucoma and other disorders of the eye. Is the patient up to date with their annual eye exam?  Yes   I Dental Screening: Recommended annual dental exams for proper oral hygiene  Community Resource Referral:  CRR required this visit?  No        Plan:  I have personally reviewed and addressed the Medicare Annual Wellness questionnaire and have noted the following in the patient's chart:  A. Medical and social history B. Use of alcohol, tobacco or illicit drugs  C. Current medications and supplements D. Functional ability and status E.  Nutritional status F.  Physical activity G. Advance directives H. List of other physicians I.  Hospitalizations, surgeries, and ER visits in previous 12 months J.  Vitals K. Screenings such as hearing and vision if needed, cognitive and depression L. Referrals and appointments   In addition, I have reviewed and discussed with patient certain preventive protocols, quality metrics, and best practice recommendations. A written personalized care plan for preventive services as well as general preventive health recommendations were provided  to patient.   Signed,   Roanna Raider, LPN  7/34/1935 Nurse Health Advisor   Nurse Notes: None

## 2020-04-17 ENCOUNTER — Ambulatory Visit (INDEPENDENT_AMBULATORY_CARE_PROVIDER_SITE_OTHER): Payer: Medicare Other | Admitting: *Deleted

## 2020-04-17 ENCOUNTER — Encounter: Payer: Self-pay | Admitting: *Deleted

## 2020-04-17 ENCOUNTER — Other Ambulatory Visit: Payer: Self-pay

## 2020-04-17 VITALS — BP 138/80 | HR 67 | Resp 16 | Ht 68.0 in | Wt 179.0 lb

## 2020-04-17 DIAGNOSIS — Z0000 Encounter for general adult medical examination without abnormal findings: Secondary | ICD-10-CM

## 2020-04-17 NOTE — Patient Instructions (Addendum)
Bill KitchenMr. Price , Thank you for taking time to come for your Medicare Wellness Visit. I appreciate your ongoing commitment to your health goals. Please review the following plan we discussed and let me know if I can assist you in the future.   Screening recommendations/referrals: Colonoscopy: No longer indicated Recommended yearly ophthalmology/optometry visit for glaucoma screening and checkup Recommended yearly dental visit for hygiene and checkup  Vaccinations: Influenza vaccine: Due 07/2020 Pneumococcal vaccine: Completed 08/31/2017 Tdap vaccine: Completed 03/09/2008-call insurance to discuss coverage Shingles vaccine: Discuss with pharmacy/insurance   Covid-19: Completed Moderna 02/20/2020  Advanced directives: Bring a copy to next appointment  Conditions/risks identified: See problem list  Next appointment: Follow up in one year for your annual wellness visit. 04/23/2021 @ 8:00am.  Preventive Care 65 Years and Older, Male Preventive care refers to lifestyle choices and visits with your health care provider that can promote health and wellness. What does preventive care include?  A yearly physical exam. This is also called an annual well check.  Dental exams once or twice a year.  Routine eye exams. Ask your health care provider how often you should have your eyes checked.  Personal lifestyle choices, including:  Daily care of your teeth and gums.  Regular physical activity.  Eating a healthy diet.  Avoiding tobacco and drug use.  Limiting alcohol use.  Practicing safe sex.  Taking low doses of aspirin every day.  Taking vitamin and mineral supplements as recommended by your health care provider. What happens during an annual well check? The services and screenings done by your health care provider during your annual well check will depend on your age, overall health, lifestyle risk factors, and family history of disease. Counseling  Your health care provider may ask you  questions about your:  Alcohol use.  Tobacco use.  Drug use.  Emotional well-being.  Home and relationship well-being.  Sexual activity.  Eating habits.  History of falls.  Memory and ability to understand (cognition).  Work and work Astronomer. Screening  You may have the following tests or measurements:  Height, weight, and BMI.  Blood pressure.  Lipid and cholesterol levels. These may be checked every 5 years, or more frequently if you are over 84 years old.  Skin check.  Lung cancer screening. You may have this screening every year starting at age 84 if you have a 30-pack-year history of smoking and currently smoke or have quit within the past 15 years.  Fecal occult blood test (FOBT) of the stool. You may have this test every year starting at age 84.  Flexible sigmoidoscopy or colonoscopy. You may have a sigmoidoscopy every 5 years or a colonoscopy every 10 years starting at age 84.  Prostate cancer screening. Recommendations will vary depending on your family history and other risks.  Hepatitis C blood test.  Hepatitis B blood test.  Sexually transmitted disease (STD) testing.  Diabetes screening. This is done by checking your blood sugar (glucose) after you have not eaten for a while (fasting). You may have this done every 1-3 years.  Abdominal aortic aneurysm (AAA) screening. You may need this if you are a current or former smoker.  Osteoporosis. You may be screened starting at age 84 if you are at high risk. Talk with your health care provider about your test results, treatment options, and if necessary, the need for more tests. Vaccines  Your health care provider may recommend certain vaccines, such as:  Influenza vaccine. This is recommended every year.  Tetanus,  diphtheria, and acellular pertussis (Tdap, Td) vaccine. You may need a Td booster every 10 years.  Zoster vaccine. You may need this after age 84.  Pneumococcal 13-valent conjugate  (PCV13) vaccine. One dose is recommended after age 84.  Pneumococcal polysaccharide (PPSV23) vaccine. One dose is recommended after age 84. Talk to your health care provider about which screenings and vaccines you need and how often you need them. This information is not intended to replace advice given to you by your health care provider. Make sure you discuss any questions you have with your health care provider. Document Released: 11/21/2015 Document Revised: 07/14/2016 Document Reviewed: 08/26/2015 Elsevier Interactive Patient Education  2017 [place] Prevention in the Home Falls can cause injuries. They can happen to people of all ages. There are many things you can do to make your home safe and to help prevent falls. What can I do on the outside of my home?  Regularly fix the edges of walkways and driveways and fix any cracks.  Remove anything that might make you trip as you walk through a door, such as a raised step or threshold.  Trim any bushes or trees on the path to your home.  Use bright outdoor lighting.  Clear any walking paths of anything that might make someone trip, such as rocks or tools.  Regularly check to see if handrails are loose or broken. Make sure that both sides of any steps have handrails.  Any raised decks and porches should have guardrails on the edges.  Have any leaves, snow, or ice cleared regularly.  Use sand or salt on walking paths during winter.  Clean up any spills in your garage right away. This includes oil or grease spills. What can I do in the bathroom?  Use night lights.  Install grab bars by the toilet and in the tub and shower. Do not use towel bars as grab bars.  Use non-skid mats or decals in the tub or shower.  If you need to sit down in the shower, use a plastic, non-slip stool.  Keep the floor dry. Clean up any water that spills on the floor as soon as it happens.  Remove soap buildup in the tub or shower  regularly.  Attach bath mats securely with double-sided non-slip rug tape.  Do not have throw rugs and other things on the floor that can make you trip. What can I do in the bedroom?  Use night lights.  Make sure that you have a light by your bed that is easy to reach.  Do not use any sheets or blankets that are too big for your bed. They should not hang down onto the floor.  Have a firm chair that has side arms. You can use this for support while you get dressed.  Do not have throw rugs and other things on the floor that can make you trip. What can I do in the kitchen?  Clean up any spills right away.  Avoid walking on wet floors.  Keep items that you use a lot in easy-to-reach places.  If you need to reach something above you, use a strong step stool that has a grab bar.  Keep electrical cords out of the way.  Do not use floor polish or wax that makes floors slippery. If you must use wax, use non-skid floor wax.  Do not have throw rugs and other things on the floor that can make you trip. What can I do with  my stairs?  Do not leave any items on the stairs.  Make sure that there are handrails on both sides of the stairs and use them. Fix handrails that are broken or loose. Make sure that handrails are as long as the stairways.  Check any carpeting to make sure that it is firmly attached to the stairs. Fix any carpet that is loose or worn.  Avoid having throw rugs at the top or bottom of the stairs. If you do have throw rugs, attach them to the floor with carpet tape.  Make sure that you have a light switch at the top of the stairs and the bottom of the stairs. If you do not have them, ask someone to add them for you. What else can I do to help prevent falls?  Wear shoes that:  Do not have high heels.  Have rubber bottoms.  Are comfortable and fit you well.  Are closed at the toe. Do not wear sandals.  If you use a stepladder:  Make sure that it is fully  opened. Do not climb a closed stepladder.  Make sure that both sides of the stepladder are locked into place.  Ask someone to hold it for you, if possible.  Clearly mark and make sure that you can see:  Any grab bars or handrails.  First and last steps.  Where the edge of each step is.  Use tools that help you move around (mobility aids) if they are needed. These include:  Canes.  Walkers.  Scooters.  Crutches.  Turn on the lights when you go into a dark area. Replace any light bulbs as soon as they burn out.  Set up your furniture so you have a clear path. Avoid moving your furniture around.  If any of your floors are uneven, fix them.  If there are any pets around you, be aware of where they are.  Review your medicines with your doctor. Some medicines can make you feel dizzy. This can increase your chance of falling. Ask your doctor what other things that you can do to help prevent falls. This information is not intended to replace advice given to you by your health care provider. Make sure you discuss any questions you have with your health care provider. Document Released: 08/21/2009 Document Revised: 04/01/2016 Document Reviewed: 11/29/2014 Elsevier Interactive Patient Education  2017 [employer].

## 2020-04-23 NOTE — Patient Instructions (Addendum)
It was very nice to see you again today! I will be in touch with your labs asap  Thank you for showing me the photos of your hand made toys today!

## 2020-04-23 NOTE — Progress Notes (Addendum)
[place] [facility] [address] [place], [place] [address] [phone]  Date:  04/24/2020   Name:  Bill Price   DOB:  [birth year]   MRN:  [id number]  PCP:  Darreld Mclean, MD    Chief Complaint: Follow-up and Lab Work (repeat cbc and ferritin)   History of Present Illness:  Bill Price is a 86 y.o. 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  He lost his wife of many years, Guerry Minors, in the last few months.  They were married for 53 years.   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 ACTINIC KERATOSIS, HEAD 10/05/2010  . ANXIETY 06/04/2010  . IRRITABLE BOWEL SYNDROME 04/15/2010  . PERIPHERAL VASCULAR DISEASE 09/10/2009  . ALLERGIC RHINITIS 09/10/2009  . GERD 04/22/2009  . ARTHRITIS, HIP 05/15/2008  . OSTEOPENIA 01/17/2008  . HYPERLIPIDEMIA 09/21/2007  . RENAL CALCULUS, HX OF 09/21/2007    Past Medical History:  Diagnosis Date  . Allergic rhinitis   . Anxiety   . Arthritis   . Colon polyp   . GERD (gastroesophageal reflux disease)   . Hemorrhoids   . Hyperlipidemia   . IBS (irritable bowel syndrome)   . Impotence   . Kidney stone   . Orthostatic hypotension   . Osteopenia   . PVD (peripheral vascular disease) ([place])   . Urinary obstruction      Past Surgical History:  Procedure Laterality Date  . COLONOSCOPY  2007  . HERNIA REPAIR  2005    Social History   Tobacco Use  . Smoking status: Never Smoker  . Smokeless tobacco: Never Used  Substance Use Topics  . Alcohol use: Yes    Alcohol/week: 0.0 standard drinks    Comment: 1 glass of wine a day  . Drug use: No    Family History  Problem Relation Age of Onset  . Prostate cancer Brother   . Colon cancer Neg Hx   . Colon polyps Neg Hx   . Diabetes Neg Hx   . Kidney disease Neg Hx   . Esophageal cancer Neg Hx   . Gallbladder disease Neg Hx   . Heart disease Neg Hx     Allergies  Allergen Reactions  . Augmentin [Amoxicillin-Pot Clavulanate] Diarrhea  . Crestor [Rosuvastatin]     Shaky, hot flashes    Medication list has been reviewed and updated.  No current outpatient medications on file prior to visit.   No current facility-administered medications on file prior to visit.    Review of Systems:  As per HPI- otherwise negative.   Physical Examination: Vitals:   04/24/20 1312  BP: 122/70  Pulse: 77  Resp: 18  Temp: (!) 97.2 F (36.2 C)  SpO2: 100%   Vitals:   04/24/20 1312  Weight: 175 lb (79.4 kg)  Height: 5\' 8"  (1.727 m)   Body mass index is 26.61 kg/m. Ideal Body Weight: Weight in (lb) to have BMI = 25: 164.1  GEN: no acute distress.  Normal weight, looks well  HEENT: Atraumatic, Normocephalic.  Ears and Nose: No external deformity. CV: RRR, No M/G/R. No JVD. No thrill. No extra heart sounds. PULM: CTA B, no wheezes, crackles, rhonchi. No retractions. No resp. distress. No accessory muscle use. EXTR: No c/c/e PSYCH: Normally interactive. Conversant.    Assessment and Plan: Mild anemia - Plan: CBC, Ferritin  Overall doing well today, recheck CBC and ferritin.  I will be in touch these results as soon as possible.  Encouraged Shingrix vaccine  This visit occurred during the SARS-CoV-2 public health emergency.  Safety  protocols were in place, including screening questions prior to the visit, additional usage of staff PPE, and extensive cleaning of exam room while observing appropriate contact time as indicated for disinfecting solutions.    Signed Marland Kitchen, MD  Received his labs as below, letter to patient  Results for orders placed or performed in visit on 04/24/20  CBC  Result Value Ref Range   WBC 4.9 4.0 - 10.5 K/uL   RBC 3.88 (L) 4.22 - 5.81 Mil/uL   Platelets 137.0 (L) 150 - 400 K/uL   Hemoglobin 13.1 13.0 - 17.0 g/dL   HCT 04/26/20 (L) 39 - 52 %   MCV 99.3 78.0 - 100.0 fl   MCHC 33.9 30.0 - 36.0 g/dL   RDW 24.0 97.3 - 53.2 %  Ferritin  Result Value Ref Range   Ferritin 192.1 22.0 - 322.0 ng/mL

## 2020-04-24 ENCOUNTER — Other Ambulatory Visit: Payer: Self-pay

## 2020-04-24 ENCOUNTER — Encounter: Payer: Self-pay | Admitting: Family Medicine

## 2020-04-24 ENCOUNTER — Ambulatory Visit (INDEPENDENT_AMBULATORY_CARE_PROVIDER_SITE_OTHER): Payer: Medicare Other | Admitting: Family Medicine

## 2020-04-24 VITALS — BP 122/70 | HR 77 | Temp 97.2°F | Resp 18 | Ht 68.0 in | Wt 175.0 lb

## 2020-04-24 DIAGNOSIS — D649 Anemia, unspecified: Secondary | ICD-10-CM

## 2020-04-24 LAB — CBC
HCT: 38.6 % — ABNORMAL LOW (ref 39.0–52.0)
Hemoglobin: 13.1 g/dL (ref 13.0–17.0)
MCHC: 33.9 g/dL (ref 30.0–36.0)
MCV: 99.3 fl (ref 78.0–100.0)
Platelets: 137 10*3/uL — ABNORMAL LOW (ref 150.0–400.0)
RBC: 3.88 Mil/uL — ABNORMAL LOW (ref 4.22–5.81)
RDW: 13.1 % (ref 11.5–15.5)
WBC: 4.9 10*3/uL (ref 4.0–10.5)

## 2020-04-24 LAB — FERRITIN: Ferritin: 192.1 ng/mL (ref 22.0–322.0)

## 2020-05-27 ENCOUNTER — Other Ambulatory Visit: Payer: Self-pay

## 2020-05-27 ENCOUNTER — Emergency Department (HOSPITAL_BASED_OUTPATIENT_CLINIC_OR_DEPARTMENT_OTHER)
Admission: EM | Admit: 2020-05-27 | Discharge: 2020-05-27 | Disposition: A | Discharge status: Home / Self Care | Payer: Medicare Other | Attending: Emergency Medicine | Admitting: Emergency Medicine

## 2020-05-27 ENCOUNTER — Encounter (HOSPITAL_BASED_OUTPATIENT_CLINIC_OR_DEPARTMENT_OTHER): Payer: Self-pay | Admitting: Emergency Medicine

## 2020-05-27 ENCOUNTER — Emergency Department (HOSPITAL_BASED_OUTPATIENT_CLINIC_OR_DEPARTMENT_OTHER): Payer: Medicare Other

## 2020-05-27 DIAGNOSIS — M25551 Pain in right hip: Secondary | ICD-10-CM

## 2020-05-27 NOTE — ED Triage Notes (Signed)
Pt c/o RT hip and LE pain since Saturday; no injury

## 2020-05-27 NOTE — Discharge Instructions (Addendum)
You were evaluated in the Emergency Department and after careful evaluation, we did not find any emergent condition requiring admission or further testing in the hospital.  Your exam/testing today was overall reassuring.  Your x-ray did not show any broken bones or abnormalities.  Your pain seems to be due to a flare of arthritis.  Please take Tylenol 1000 mg every 4-6 hours at home for discomfort.  We recommend follow-up with your primary care doctor if this pain continues after 2 weeks.  Please return to the Emergency Department if you experience any worsening of your condition.  Thank you for allowing [place] to be a part of your care.

## 2020-05-27 NOTE — ED Provider Notes (Signed)
[facility] Emergency Department Provider Note MRN:  [id number]  Arrival date & time: 05/27/20     Chief Complaint   Leg Pain   History of Present Illness   Bill Price is a 86 y.o. year-old male with a history of peripheral vascular disease presenting to the ED with chief complaint of pain.  Right hip pain for the past 2 or 3 days.  Started the day after doing extended gardening.  Pain is worse with motion or palpation of the right hip.  Mild to moderate in severity.  Denies any chest pain or shortness of breath, no headache or vision change, no other complaints.  Review of Systems  A complete 10 system review of systems was obtained and all systems are negative except as noted in the HPI and PMH.   Patient's Health History    Past Medical History:  Diagnosis Date  . Allergic rhinitis   . Anxiety   . Arthritis   . Colon polyp   . GERD (gastroesophageal reflux disease)   . Hemorrhoids   . Hyperlipidemia   . IBS (irritable bowel syndrome)   . Impotence   . Kidney stone   . Orthostatic hypotension   . Osteopenia   . PVD (peripheral vascular disease) (HCC)   . Urinary obstruction     Past Surgical History:  Procedure Laterality Date  . COLONOSCOPY  2007  . HERNIA REPAIR  2005    Family History  Problem Relation Age of Onset  . Prostate cancer Brother   . Colon cancer Neg Hx   . Colon polyps Neg Hx   . Diabetes Neg Hx   . Kidney disease Neg Hx   . Esophageal cancer Neg Hx   . Gallbladder disease Neg Hx   . Heart disease Neg Hx     Social History   Socioeconomic History  . Marital status: Widowed    Spouse name: Not on file  . Number of children: 0  . Years of education: Not on file  . Highest education level: Not on file  Occupational History  . Occupation: Retired  Tobacco Use  . Smoking status: Never Smoker  . Smokeless tobacco: Never Used  Substance and Sexual Activity  . Alcohol use: Yes    Alcohol/week: 0.0  standard drinks    Comment: 1 glass of wine a day  . Drug use: No  . Sexual activity: Never  Other Topics Concern  . Not on file  Social History Narrative  . Not on file   Social Determinants of Health   Financial Resource Strain: Low Risk   . Difficulty of Paying Living Expenses: Not hard at all  Food Insecurity: No Food Insecurity  . Worried About Programme researcher, broadcasting/film/video in the Last Year: Never true  . Ran Out of Food in the Last Year: Never true  Transportation Needs: No Transportation Needs  . Lack of Transportation (Medical): No  . Lack of Transportation (Non-Medical): No  Physical Activity: Inactive  . Days of Exercise per Week: 0 days  . Minutes of Exercise per Session: 0 min  Stress: No Stress Concern Present  . Feeling of Stress : Not at all  Social Connections: Moderately Isolated  . Frequency of Communication with Friends and Family: Once a week  . Frequency of Social Gatherings with Friends and Family: Once a week  . Attends Religious Services: More than 4 times per year  . Active Member of Clubs or Organizations: Yes  .  Attends Banker Meetings: More than 4 times per year  . Marital Status: Widowed  Intimate Partner Violence:   . Fear of Current or Ex-Partner:   . Emotionally Abused:   Marland Kitchen Physically Abused:   . Sexually Abused:      Physical Exam   Vitals:   05/27/20 0924  BP: (!) 123/55  Pulse: 68  Resp: 18  Temp: 97.6 F (36.4 C)  SpO2: 97%    CONSTITUTIONAL: Well-appearing, NAD NEURO:  Alert and oriented x 3, no focal deficits EYES:  eyes equal and reactive ENT/NECK:  no LAD, no JVD CARDIO: Regular rate, well-perfused, normal S1 and S2 PULM:  CTAB no wheezing or rhonchi GI/GU:  normal bowel sounds, non-distended, non-tender MSK/SPINE:  No gross deformities, no edema, mild tenderness to palpation to the right hip, largely preserved range of motion SKIN:  no rash, atraumatic PSYCH:  Appropriate speech and behavior  *Additional and/or  pertinent findings included in MDM below  Diagnostic and Interventional Summary    EKG Interpretation  Date/Time:    Ventricular Rate:    PR Interval:    QRS Duration:   QT Interval:    QTC Calculation:   R Axis:     Text Interpretation:        Labs Reviewed - No data to display  DG Hip Unilat W or Wo Pelvis 2-3 Views Right  Final Result      Medications - No data to display   Procedures  /  Critical Care Procedures  ED Course and Medical Decision Making  I have reviewed the triage vital signs, the nursing notes, and pertinent available records from the EMR.  Listed above are laboratory and imaging tests that I personally ordered, reviewed, and interpreted and then considered in my medical decision making (see below for details).      No erythema, no edema, no increased warmth to the extremities, nothing to suggest infection.  Preserved range of motion, doubt septic joint.  Overall consistent with MSK pain, x-rays negative for acute fracture, suspect arthritis related to increased gardening, appropriate for discharge.    Elmer Sow. Pilar Plate, MD [facility] Emergency [facility] [email or web address]  Final Clinical Impressions(s) / ED Diagnoses     ICD-10-CM   1. Right hip pain  M25.551     ED Discharge Orders    None       Discharge Instructions Discussed with and Provided to Patient:     Discharge Instructions     You were evaluated in the Emergency Department and after careful evaluation, we did not find any emergent condition requiring admission or further testing in the hospital.  Your exam/testing today was overall reassuring.  Your x-ray did not show any broken bones or abnormalities.  Your pain seems to be due to a flare of arthritis.  Please take Tylenol 1000 mg every 4-6 hours at home for discomfort.  We recommend follow-up with your primary care doctor if this pain continues after 2 weeks.  Please return to the Emergency  Department if you experience any worsening of your condition.  Thank you for allowing [place] to be a part of your care.       Sabas Sous, MD 05/27/20 1031

## 2020-06-02 ENCOUNTER — Telehealth: Payer: Self-pay | Admitting: Family Medicine

## 2020-06-02 NOTE — Telephone Encounter (Signed)
  Per [employer] NP,performed a Peripheral Arterial Disease screening :   Patient's Results : right leg moderate reading Left leg significant reading

## 2020-06-09 ENCOUNTER — Inpatient Hospital Stay (HOSPITAL_BASED_OUTPATIENT_CLINIC_OR_DEPARTMENT_OTHER)
Admission: EM | Admit: 2020-06-09 | Discharge: 2020-06-12 | DRG: 522 | Disposition: A | Discharge status: Home Health | Payer: Medicare Other | Attending: Internal Medicine | Admitting: Internal Medicine

## 2020-06-09 ENCOUNTER — Emergency Department (HOSPITAL_BASED_OUTPATIENT_CLINIC_OR_DEPARTMENT_OTHER): Payer: Medicare Other

## 2020-06-09 ENCOUNTER — Other Ambulatory Visit: Payer: Self-pay

## 2020-06-09 ENCOUNTER — Encounter (HOSPITAL_BASED_OUTPATIENT_CLINIC_OR_DEPARTMENT_OTHER): Payer: Self-pay | Admitting: *Deleted

## 2020-06-09 ENCOUNTER — Telehealth: Payer: Self-pay | Admitting: Family Medicine

## 2020-06-09 DIAGNOSIS — I739 Peripheral vascular disease, unspecified: Secondary | ICD-10-CM | POA: Diagnosis present

## 2020-06-09 DIAGNOSIS — F419 Anxiety disorder, unspecified: Secondary | ICD-10-CM | POA: Diagnosis present

## 2020-06-09 DIAGNOSIS — M8580 Other specified disorders of bone density and structure, unspecified site: Secondary | ICD-10-CM | POA: Diagnosis not present

## 2020-06-09 DIAGNOSIS — I951 Orthostatic hypotension: Secondary | ICD-10-CM | POA: Diagnosis present

## 2020-06-09 DIAGNOSIS — E785 Hyperlipidemia, unspecified: Secondary | ICD-10-CM | POA: Diagnosis present

## 2020-06-09 DIAGNOSIS — K589 Irritable bowel syndrome without diarrhea: Secondary | ICD-10-CM | POA: Diagnosis present

## 2020-06-09 DIAGNOSIS — S72009A Fracture of unspecified part of neck of unspecified femur, initial encounter for closed fracture: Secondary | ICD-10-CM | POA: Diagnosis present

## 2020-06-09 DIAGNOSIS — S72001A Fracture of unspecified part of neck of right femur, initial encounter for closed fracture: Secondary | ICD-10-CM | POA: Diagnosis not present

## 2020-06-09 DIAGNOSIS — S72011A Unspecified intracapsular fracture of right femur, initial encounter for closed fracture: Secondary | ICD-10-CM | POA: Diagnosis present

## 2020-06-09 DIAGNOSIS — Z888 Allergy status to other drugs, medicaments and biological substances status: Secondary | ICD-10-CM | POA: Diagnosis not present

## 2020-06-09 DIAGNOSIS — Z96641 Presence of right artificial hip joint: Secondary | ICD-10-CM

## 2020-06-09 DIAGNOSIS — Z20822 Contact with and (suspected) exposure to covid-19: Secondary | ICD-10-CM | POA: Diagnosis present

## 2020-06-09 DIAGNOSIS — J309 Allergic rhinitis, unspecified: Secondary | ICD-10-CM | POA: Diagnosis present

## 2020-06-09 DIAGNOSIS — Z881 Allergy status to other antibiotic agents status: Secondary | ICD-10-CM

## 2020-06-09 DIAGNOSIS — Z7289 Other problems related to lifestyle: Secondary | ICD-10-CM | POA: Diagnosis not present

## 2020-06-09 DIAGNOSIS — X58XXXA Exposure to other specified factors, initial encounter: Secondary | ICD-10-CM | POA: Diagnosis present

## 2020-06-09 DIAGNOSIS — K219 Gastro-esophageal reflux disease without esophagitis: Secondary | ICD-10-CM | POA: Diagnosis present

## 2020-06-09 DIAGNOSIS — D638 Anemia in other chronic diseases classified elsewhere: Secondary | ICD-10-CM | POA: Diagnosis present

## 2020-06-09 DIAGNOSIS — M810 Age-related osteoporosis without current pathological fracture: Secondary | ICD-10-CM | POA: Diagnosis present

## 2020-06-09 DIAGNOSIS — M25551 Pain in right hip: Secondary | ICD-10-CM

## 2020-06-09 DIAGNOSIS — Z419 Encounter for procedure for purposes other than remedying health state, unspecified: Secondary | ICD-10-CM

## 2020-06-09 LAB — CBC WITH DIFFERENTIAL/PLATELET
Abs Immature Granulocytes: 0.04 10*3/uL (ref 0.00–0.07)
Basophils Absolute: 0 10*3/uL (ref 0.0–0.1)
Basophils Relative: 0 %
Eosinophils Absolute: 0 10*3/uL (ref 0.0–0.5)
Eosinophils Relative: 0 %
HCT: 38.7 % — ABNORMAL LOW (ref 39.0–52.0)
Hemoglobin: 12.8 g/dL — ABNORMAL LOW (ref 13.0–17.0)
Immature Granulocytes: 1 %
Lymphocytes Relative: 10 %
Lymphs Abs: 0.9 10*3/uL (ref 0.7–4.0)
MCH: 32.9 pg (ref 26.0–34.0)
MCHC: 33.1 g/dL (ref 30.0–36.0)
MCV: 99.5 fL (ref 80.0–100.0)
Monocytes Absolute: 0.9 10*3/uL (ref 0.1–1.0)
Monocytes Relative: 10 %
Neutro Abs: 7 10*3/uL (ref 1.7–7.7)
Neutrophils Relative %: 79 %
Platelets: 196 10*3/uL (ref 150–400)
RBC: 3.89 MIL/uL — ABNORMAL LOW (ref 4.22–5.81)
RDW: 12.1 % (ref 11.5–15.5)
WBC: 8.8 10*3/uL (ref 4.0–10.5)
nRBC: 0 % (ref 0.0–0.2)

## 2020-06-09 LAB — BASIC METABOLIC PANEL
Anion gap: 12 (ref 5–15)
BUN: 25 mg/dL — ABNORMAL HIGH (ref 8–23)
CO2: 24 mmol/L (ref 22–32)
Calcium: 9.3 mg/dL (ref 8.9–10.3)
Chloride: 100 mmol/L (ref 98–111)
Creatinine, Ser: 0.95 mg/dL (ref 0.61–1.24)
GFR calc Af Amer: >60 mL/min (ref >60)
GFR calc non Af Amer: >60 mL/min (ref >60)
Glucose, Bld: 164 mg/dL — ABNORMAL HIGH (ref 70–99)
Potassium: 3.9 mmol/L (ref 3.5–5.1)
Sodium: 136 mmol/L (ref 135–145)

## 2020-06-09 LAB — SARS CORONAVIRUS 2 BY RT PCR (HOSPITAL ORDER, PERFORMED IN ~~LOC~~ HOSPITAL LAB): SARS Coronavirus 2: NEGATIVE

## 2020-06-09 LAB — PROTIME-INR
INR: 1 (ref 0.8–1.2)
Prothrombin Time: 13.1 seconds (ref 11.4–15.2)

## 2020-06-09 MED ORDER — ONDANSETRON HCL 4 MG PO TABS: 4.0000 mg | ORAL_TABLET | Freq: Four times a day (QID) | ORAL | Status: DC | PRN

## 2020-06-09 MED ORDER — ACETAMINOPHEN 325 MG PO TABS
650.0000 mg | ORAL_TABLET | Freq: Four times a day (QID) | ORAL | Status: DC | PRN
  Administered 2020-06-09 – 2020-06-12 (×5): 650 mg via ORAL
  Filled 2020-06-09 (×5): qty 2

## 2020-06-09 MED ORDER — METHOCARBAMOL 1000 MG/10ML IJ SOLN
500.0000 mg | Freq: Four times a day (QID) | INTRAVENOUS | Status: DC | PRN
  Filled 2020-06-09: qty 5

## 2020-06-09 MED ORDER — HYDROCODONE-ACETAMINOPHEN 5-325 MG PO TABS
2.0000 | ORAL_TABLET | Freq: Once | ORAL | Status: AC
  Administered 2020-06-09: 2 via ORAL
  Filled 2020-06-09: qty 2

## 2020-06-09 MED ORDER — HYDROMORPHONE HCL 1 MG/ML IJ SOLN
0.5000 mg | INTRAMUSCULAR | Status: DC | PRN
  Administered 2020-06-09: 0.5 mg via INTRAVENOUS
  Filled 2020-06-09: qty 1

## 2020-06-09 MED ORDER — POLYETHYLENE GLYCOL 3350 17 G PO PACK
17.0000 g | PACK | Freq: Every day | ORAL | Status: DC | PRN
  Administered 2020-06-11: 17 g via ORAL
  Filled 2020-06-09: qty 1

## 2020-06-09 MED ORDER — DEXAMETHASONE SODIUM PHOSPHATE 10 MG/ML IJ SOLN
10.0000 mg | Freq: Once | INTRAMUSCULAR | Status: AC
  Administered 2020-06-09: 10 mg via INTRAMUSCULAR
  Filled 2020-06-09: qty 1

## 2020-06-09 MED ORDER — TRAMADOL HCL 50 MG PO TABS: 50.0000 mg | ORAL_TABLET | Freq: Four times a day (QID) | ORAL | Status: DC | PRN

## 2020-06-09 MED ORDER — MORPHINE SULFATE (PF) 2 MG/ML IV SOLN: 1.0000 mg | INTRAVENOUS | Status: DC | PRN

## 2020-06-09 MED ORDER — SODIUM CHLORIDE 0.9 % IV SOLN: INTRAVENOUS | Status: DC

## 2020-06-09 MED ORDER — ALBUTEROL SULFATE (2.5 MG/3ML) 0.083% IN NEBU: 2.5000 mg | INHALATION_SOLUTION | RESPIRATORY_TRACT | Status: DC | PRN

## 2020-06-09 MED ORDER — POVIDONE-IODINE 10 % EX SWAB
2.0000 "application " | Freq: Once | CUTANEOUS | Status: AC
  Administered 2020-06-10: 2 via TOPICAL

## 2020-06-09 MED ORDER — BISACODYL 10 MG RE SUPP: 10.0000 mg | Freq: Every day | RECTAL | Status: DC | PRN

## 2020-06-09 MED ORDER — MORPHINE SULFATE (PF) 2 MG/ML IV SOLN: 2.0000 mg | INTRAVENOUS | Status: DC | PRN

## 2020-06-09 MED ORDER — CALCIUM CARBONATE-VITAMIN D 500-200 MG-UNIT PO TABS
1.0000 | ORAL_TABLET | Freq: Two times a day (BID) | ORAL | Status: DC
  Administered 2020-06-09 – 2020-06-12 (×6): 1 via ORAL
  Filled 2020-06-09 (×6): qty 1

## 2020-06-09 MED ORDER — CEFAZOLIN SODIUM-DEXTROSE 2-4 GM/100ML-% IV SOLN
2.0000 g | INTRAVENOUS | Status: AC
  Administered 2020-06-10: 2 g via INTRAVENOUS
  Filled 2020-06-09: qty 100

## 2020-06-09 MED ORDER — ONDANSETRON HCL 4 MG/2ML IJ SOLN: 4.0000 mg | Freq: Four times a day (QID) | INTRAMUSCULAR | Status: DC | PRN

## 2020-06-09 MED ORDER — PANTOPRAZOLE SODIUM 20 MG PO TBEC
20.0000 mg | DELAYED_RELEASE_TABLET | Freq: Every day | ORAL | Status: DC
  Administered 2020-06-09 – 2020-06-12 (×4): 20 mg via ORAL
  Filled 2020-06-09 (×4): qty 1

## 2020-06-09 MED ORDER — MUPIROCIN 2 % EX OINT: 1.0000 "application " | TOPICAL_OINTMENT | Freq: Two times a day (BID) | CUTANEOUS | Status: DC

## 2020-06-09 MED ORDER — OXYCODONE HCL 5 MG PO TABS: 5.0000 mg | ORAL_TABLET | ORAL | Status: DC | PRN

## 2020-06-09 MED ORDER — ENSURE PRE-SURGERY PO LIQD
296.0000 mL | Freq: Once | ORAL | Status: AC
  Administered 2020-06-10: 296 mL via ORAL
  Filled 2020-06-09: qty 296

## 2020-06-09 MED ORDER — TRANEXAMIC ACID-NACL 1000-0.7 MG/100ML-% IV SOLN: 1000.0000 mg | INTRAVENOUS | Status: DC

## 2020-06-09 MED ORDER — CHLORHEXIDINE GLUCONATE 4 % EX LIQD
60.0000 mL | Freq: Once | CUTANEOUS | Status: AC
  Administered 2020-06-10: 4 via TOPICAL

## 2020-06-09 NOTE — Telephone Encounter (Signed)
Pt's caregiver dropped off document for provider to have on pt's chart (Primary Care document 1 page) Document put at front office tray under providers name.

## 2020-06-09 NOTE — ED Notes (Signed)
Patient is resting comfortably. 

## 2020-06-09 NOTE — ED Provider Notes (Signed)
[facility] EMERGENCY DEPARTMENT Provider Note   CSN: [id number] Arrival date & time: 06/09/20  1245     History Chief Complaint  Patient presents with  . Leg Pain    Bill Price is a 86 y.o. male.  86 year old male presents with complaint of pain in his right hip.  Patient states pain started about 2 weeks ago, was seen in this emergency room on July 20, had a x-ray of the hip that did not show acute bony injury and was diagnosed with arthritis flare and discharged home to take Tylenol.  Patient states that the Tylenol was helping somewhat however today he is unable to bear weight on this right leg secondary to pain.  Pain is worse with any movement of his right hip as well as with palpation of the lateral aspect of the hip.  Denies any falls or injuries, states that he had done gardening just prior to the onset of his pain 2 weeks ago.  Nuys abdominal pain, changes in bowel or bladder habits, leg weakness or numbness.  Patient believes he has a pinched nerve.  Patient has a history of osteoporosis.  Patient lives at home alone with his cat, states his wife died in 2023/01/03, he has friends that check in on him and have been helping him with meals recently.  No family locally.         Past Medical History:  Diagnosis Date  . Allergic rhinitis   . Anxiety   . Arthritis   . Colon polyp   . GERD (gastroesophageal reflux disease)   . Hemorrhoids   . Hyperlipidemia   . IBS (irritable bowel syndrome)   . Impotence   . Kidney stone   . Orthostatic hypotension   . Osteopenia   . PVD (peripheral vascular disease) (HCC)   . Urinary obstruction     Patient Active Problem List   Diagnosis Date Noted  . Hip fracture (HCC) 06/09/2020  . 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 ACTINIC KERATOSIS, HEAD 10/05/2010  . ANXIETY 06/04/2010  . IRRITABLE BOWEL SYNDROME 04/15/2010  . PERIPHERAL VASCULAR DISEASE 09/10/2009  . ALLERGIC RHINITIS 09/10/2009  . GERD 04/22/2009  .  ARTHRITIS, HIP 05/15/2008  . OSTEOPENIA 01/17/2008  . HYPERLIPIDEMIA 09/21/2007  . RENAL CALCULUS, HX OF 09/21/2007    Past Surgical History:  Procedure Laterality Date  . COLONOSCOPY  2007  . HERNIA REPAIR  2005       Family History  Problem Relation Age of Onset  . Prostate cancer Brother   . Colon cancer Neg Hx   . Colon polyps Neg Hx   . Diabetes Neg Hx   . Kidney disease Neg Hx   . Esophageal cancer Neg Hx   . Gallbladder disease Neg Hx   . Heart disease Neg Hx     Social History   Tobacco Use  . Smoking status: Never Smoker  . Smokeless tobacco: Never Used  Substance Use Topics  . Alcohol use: Yes    Alcohol/week: 0.0 standard drinks    Comment: 1 glass of wine a day  . Drug use: No    Home Medications Prior to Admission medications   Not on File    Allergies    Augmentin [amoxicillin-pot clavulanate] and Crestor [rosuvastatin]  Review of Systems   Review of Systems  Constitutional: Negative for fever.  Respiratory: Negative for shortness of breath.   Cardiovascular: Negative for chest pain.  Gastrointestinal: Negative for abdominal pain, constipation and diarrhea.  Genitourinary: Negative for decreased urine volume, difficulty urinating and dysuria.  Musculoskeletal: Positive for arthralgias, gait problem and myalgias. Negative for back pain.  Skin: Negative for color change, rash and wound.  Allergic/Immunologic: Negative for immunocompromised state.  Neurological: Negative for weakness and numbness.  Psychiatric/Behavioral: Negative for confusion.  All other systems reviewed and are negative.   Physical Exam Updated Vital Signs BP 138/73   Pulse 82   Temp 97.7 F (36.5 C) (Oral)   Resp 18   Ht 5\' 8"  (1.727 m)   Wt 78.5 kg   SpO2 95%   BMI 26.32 kg/m   Physical Exam Vitals and nursing note reviewed.  Constitutional:      General: He is not in acute distress.    Appearance: He is well-developed. He is not diaphoretic.  HENT:      Head: Normocephalic and atraumatic.  Cardiovascular:     Pulses: Normal pulses.     Comments: DP and PT pulses obtained with bedside doppler Pulmonary:     Effort: Pulmonary effort is normal.  Abdominal:     Palpations: Abdomen is soft.     Tenderness: There is no abdominal tenderness.  Musculoskeletal:        General: Tenderness present.     Right lower leg: Edema present.     Left lower leg: Edema present.       Legs:     Comments: Pitting edema to bilateral lower legs to mid lower leg, patient states this is his normal/baseline  TTP right SI and right lateral hip, pain in right hip with any movement of the right hip.  Skin:    General: Skin is warm and dry.     Capillary Refill: Capillary refill takes 2 to 3 seconds.     Findings: No erythema or rash.  Neurological:     Mental Status: He is alert and oriented to person, place, and time.     Sensory: No sensory deficit.     Comments: Symmetric push/pull of feet, unable to lift right leg off bed secondary to pain.  Psychiatric:        Behavior: Behavior normal.     ED Results / Procedures / Treatments   Labs (all labs ordered are listed, but only abnormal results are displayed) Labs Reviewed  BASIC METABOLIC PANEL - Abnormal; Notable for the following components:      Result Value   Glucose, Bld 164 (*)    BUN 25 (*)    All other components within normal limits  CBC WITH DIFFERENTIAL/PLATELET - Abnormal; Notable for the following components:   RBC 3.89 (*)    Hemoglobin 12.8 (*)    HCT 38.7 (*)    All other components within normal limits  SARS CORONAVIRUS 2 BY RT PCR (HOSPITAL ORDER, PERFORMED IN  HOSPITAL LAB)  PROTIME-INR    EKG None  Radiology CT PELVIS WO CONTRAST  Result Date: 06/09/2020 CLINICAL DATA:  Pelvis pain. Right hip and tenderness since July 20th. No known injury. EXAM: CT PELVIS WITHOUT CONTRAST TECHNIQUE: Multidetector CT imaging of the pelvis was performed following the standard  protocol without intravenous contrast. COMPARISON:  Right hip radiographs 05/27/2020 FINDINGS: Urinary Tract: The distal ureters and urinary bladder are within normal limits. Bowel:  Unremarkable visualized pelvic bowel loops. Vascular/Lymphatic: Atherosclerotic calcifications are present within the distal aorta and branch vessels without aneurysm or focal stenosis. Reproductive:  Prostate gland is within normal limits. Other: Fat herniates into the left inguinal canal without associated  bowel. No other significant ventral hernia is present. No free fluid or free air is present. Musculoskeletal: A right subcapital femoral neck fracture is present. Fracture is displaced 1/2 shaft width with increased varus angulation. Acetabulum is intact. No acute pelvic fractures are present. Degenerative changes are present lumbar spine including grade 2 anterolisthesis at L5-S1 secondary to bilateral pars defects. IMPRESSION: 1. Right subcapital femoral neck fracture with displaced 1/2 shaft width with increased varus angulation. 2. Fat herniates into the left inguinal canal without associated bowel. 3. Grade 2 anterolisthesis at L5-S1 secondary to bilateral pars defects. 4. Aortic Atherosclerosis (ICD10-I70.0). These results were called by telephone at the time of interpretation on 06/09/2020 at 2:26 pm to provider Dr Dalene SeltzerSchlossman, who verbally acknowledged these results. Electronically Signed   By: Marin Robertshristopher  Mattern M.D.   On: 06/09/2020 14:26   DG Chest Portable 1 View  Result Date: 06/09/2020 CLINICAL DATA:  Hip fracture EXAM: PORTABLE CHEST 1 VIEW COMPARISON:  2016 FINDINGS: The heart size and mediastinal contours are within normal limits. Mild chronic interstitial prominence. No new consolidation or edema. Right costophrenic angle is partially excluded. No pleural effusion or pneumothorax. The visualized skeletal structures are unremarkable. IMPRESSION: No acute process in the chest. Electronically Signed   By: Guadlupe SpanishPraneil   Patel M.D.   On: 06/09/2020 14:14    Procedures Procedures (including critical care time)  Medications Ordered in ED Medications  HYDROmorphone (DILAUDID) injection 0.5 mg (has no administration in time range)  dexamethasone (DECADRON) injection 10 mg (10 mg Intramuscular Given 06/09/20 1344)  HYDROcodone-acetaminophen (NORCO/VICODIN) 5-325 MG per tablet 2 tablet (2 tablets Oral Given 06/09/20 1344)    ED Course  I have reviewed the triage vital signs and the nursing notes.  Pertinent labs & imaging results that were available during my care of the patient were reviewed by me and considered in my medical decision making (see chart for details).  Clinical Course as of Jun 09 1524  Mon Jun 09, 2020  [id number] 86 year old male brought in by EMS from home for ongoing right hip pain.  Patient was seen in the emergency room on July 20 for right hip pain after he had been doing extensive gardening, had an x-ray that was negative for bony injury and was advised to take Tylenol for suspected arthritis flare.  Patient states that he was doing okay at home with some relief with the Tylenol however today had worsening of his pain and is unable to walk now due to pain in the right hip.  On exam, patient has tenderness to the right SI joint as well as lateral right hip, unable to range his hip secondary to pain.  DP and PT pulses are present by bedside Doppler, does have pitting edema to bilateral lower legs which he states is normal for him.  Patient was given Decadron and Vicodin for his pain, sent for CT pelvis to further evaluate pelvis and hips. CT shows a right hip fracture. Patient informed of results and plan to transfer, ortho paged for consult.    [LM]  1514 Case discussed with Dr. Yevette Edwardsumonski with orthopedics, [facility]. Hospitalist paged for consult.    [LM]  1515 Discussed with hospitalist, [facility]   [LM]    Clinical  Course User Index [LM] Alden HippMurphy, Natania Finigan A, PA-C   MDM Rules/Calculators/A&P  Final Clinical Impression(s) / ED Diagnoses Final diagnoses:  Closed fracture of right hip, initial encounter [facility])    Rx / DC Orders ED Discharge Orders    None       Jeannie Fend, PA-C 06/09/20 1525    Alvira Monday, MD 06/09/20 [phone]

## 2020-06-09 NOTE — ED Triage Notes (Signed)
Right hip pain radiating to his right thigh.  Pain gets worst since last night.

## 2020-06-09 NOTE — ED Notes (Signed)
ED Provider at bedside. 

## 2020-06-09 NOTE — ED Notes (Signed)
Patient transported to CT 

## 2020-06-09 NOTE — H&P (Signed)
History and Physical    DOA: 06/09/2020  PCP: Pearline Cables, MD  Patient coming from: Home  Chief Complaint: Worsening hip pain   HPI: Bill Price is a 86 y.o. male with history h/o hyperlipidemia, orthostatic hypotension, peripheral vascular disease, GERD, colon polyps, anxiety, osteopenia presents with complaints of worsening hip pain.   Patient states about a month back he might have hit his hip against something as he noted bruising and had some lingering pain. Patient was seen in the ED on 7/20 for complaints of severe right hip pain while trying to get up from chair.  He had worked in his garden for 30 to 45 minutes earlier that day without any significant discomfort.   Patient underwent x-rays which were negative for acute fracture, felt to have exacerbation of arthritis due to prolonged activity/gardening and discharged on symptomatic treatment for musculoskeletal pain.  Patient states he was taking Tylenol 1000 mg every 4-6 hours and it was helping somewhat, he was also using his wife's cane to ambulate.  However, today he was unable to bear weight on the right leg with significant worsening of pain with any range of motion or even touch which brought him back to the [facility] ED.  Patient was concerned that he might have pinched a nerve and presented to the ED.  He lives alone and is otherwise quite independent at his baseline.  His wife passed away earlier this year.  He does not have any children.  His siblings live out of state.  He states his former Network engineer and a close friend, Dennie Bible, checks on him daily. ED course: Afebrile, vitals unremarkable, labs with hemoglobin 12.8, BUN 25, creatinine 1.9, otherwise unremarkable.  On EDP exam, patient had right hip and sacroiliac tenderness.  He received Decadron, Vicodin for pain and imaging studies were obtained.  X-ray as well as CT pelvis was obtained that showed displaced right subcapital femoral neck fracture.  CT also  reported L5-S1 DDD.  Orthopedics was consulted-Dr. Yevette Edwards advised transfer to [facility] for [facility] admission and surgical intervention in a.m. patient on arrival here appears comfortable, denies any chest pain or dizziness or falls.  He rates his pain as 2/10.   Review of Systems: As per HPI otherwise 10 point review of systems negative.    Past Medical History:  Diagnosis Date  . Allergic rhinitis   . Anxiety   . Arthritis   . Colon polyp   . GERD (gastroesophageal reflux disease)   . Hemorrhoids   . Hyperlipidemia   . IBS (irritable bowel syndrome)   . Impotence   . Kidney stone   . Orthostatic hypotension   . Osteopenia   . PVD (peripheral vascular disease) (HCC)   . Urinary obstruction     Past Surgical History:  Procedure Laterality Date  . COLONOSCOPY  2007  . HERNIA REPAIR  2005    Social history:  reports that he has never smoked. He has never used smokeless tobacco. He reports current alcohol use. He reports that he does not use drugs.   Allergies  Allergen Reactions  . Augmentin [Amoxicillin-Pot Clavulanate] Diarrhea  . Crestor [Rosuvastatin]     Shaky, hot flashes    Family History  Problem Relation Age of Onset  . Prostate cancer Brother   . Colon cancer Neg Hx   . Colon polyps Neg Hx   . Diabetes Neg Hx   . Kidney disease Neg Hx   . Esophageal cancer Neg  Hx   . Gallbladder disease Neg Hx   . Heart disease Neg Hx       Prior to Admission medications   Medication Sig Start Date End Date Taking? Authorizing Provider  acetaminophen (TYLENOL) 325 MG tablet Take 650 mg by mouth every 6 (six) hours as needed for mild pain.   Yes [provider]    Physical Exam: Vitals:   06/09/20 1312 06/09/20 1415 06/09/20 1524 06/09/20 1732  BP:  138/73 132/66 130/71  Pulse:  82 78 81  Resp:  18 (!) 22 16  Temp:    98 F (36.7 C)  TempSrc:    Oral  SpO2:  95% 97% 96%  Weight: 78.5 kg     Height: 5\' 8"  (1.727 m)       Constitutional: Very  pleasant elderly male, appears calm, no acute distress Eyes: PERRL, lids and conjunctivae normal ENMT: Mucous membranes are moist. Posterior pharynx clear of any exudate or lesions.Normal dentition.  Neck: normal, supple, no masses, no thyromegaly Respiratory: clear to auscultation bilaterally, no wheezing, no crackles. Normal respiratory effort. No accessory muscle use.  Cardiovascular: Regular rate and rhythm, no murmurs / rubs / gallops.  Prominent veins but no lower extremity edema. 2+ pedal pulses. No carotid bruits.  Abdomen: no tenderness, no masses palpated. No hepatosplenomegaly. Bowel sounds positive.  Musculoskeletal: no clubbing / cyanosis.  Decreased range of motion along right hip joint.  No obvious bruising noted at this time although patient reports noting bruising last week.  Normal muscle tone.  Neurologic: CN 2-12 grossly intact. Sensation intact, DTR normal. Strength 5/5 in all 4.  Psychiatric: Normal judgment and insight. Alert and oriented x 3. Normal mood.  SKIN/catheters: no rashes, lesions, ulcers. No induration  Labs on Admission: I have personally reviewed following labs and imaging studies  CBC: Recent Labs  Lab 06/09/20 1413  WBC 8.8  NEUTROABS 7.0  HGB 12.8*  HCT 38.7*  MCV 99.5  PLT 196   Basic Metabolic Panel: Recent Labs  Lab 06/09/20 1413  NA 136  K 3.9  CL 100  CO2 24  GLUCOSE 164*  BUN 25*  CREATININE 0.95  CALCIUM 9.3   GFR: Estimated Creatinine Clearance: 51 mL/min (by C-G formula based on SCr of 0.95 mg/dL). Recent Labs  Lab 06/09/20 1413  WBC 8.8   Liver Function Tests: No results for input(s): AST, ALT, ALKPHOS, BILITOT, PROT, ALBUMIN in the last 168 hours. No results for input(s): LIPASE, AMYLASE in the last 168 hours. No results for input(s): AMMONIA in the last 168 hours. Coagulation Profile: Recent Labs  Lab 06/09/20 1413  INR 1.0   Cardiac Enzymes: No results for input(s): CKTOTAL, CKMB, CKMBINDEX, TROPONINI in the  last 168 hours. BNP (last 3 results) No results for input(s): PROBNP in the last 8760 hours. HbA1C: No results for input(s): HGBA1C in the last 72 hours. CBG: No results for input(s): GLUCAP in the last 168 hours. Lipid Profile: No results for input(s): CHOL, HDL, LDLCALC, TRIG, CHOLHDL, LDLDIRECT in the last 72 hours. Thyroid Function Tests: No results for input(s): TSH, T4TOTAL, FREET4, T3FREE, THYROIDAB in the last 72 hours. Anemia Panel: No results for input(s): VITAMINB12, FOLATE, FERRITIN, TIBC, IRON, RETICCTPCT in the last 72 hours. Urine analysis:    Component Value Date/Time   BILIRUBINUR small (A) 10/09/2015 1503   BILIRUBINUR Small 10/24/2014 1004   KETONESUR negative 10/09/2015 1503   PROTEINUR negative 10/09/2015 1503   PROTEINUR Negative 10/24/2014 1004   UROBILINOGEN 0.2  10/09/2015 1503   NITRITE Negative 10/09/2015 1503   NITRITE Negative 10/24/2014 1004   LEUKOCYTESUR Negative 10/09/2015 1503    Radiological Exams on Admission: Personally reviewed  CT PELVIS WO CONTRAST  Result Date: 06/09/2020 CLINICAL DATA:  Pelvis pain. Right hip and tenderness since July 20th. No known injury. EXAM: CT PELVIS WITHOUT CONTRAST TECHNIQUE: Multidetector CT imaging of the pelvis was performed following the standard protocol without intravenous contrast. COMPARISON:  Right hip radiographs 05/27/2020 FINDINGS: Urinary Tract: The distal ureters and urinary bladder are within normal limits. Bowel:  Unremarkable visualized pelvic bowel loops. Vascular/Lymphatic: Atherosclerotic calcifications are present within the distal aorta and branch vessels without aneurysm or focal stenosis. Reproductive:  Prostate gland is within normal limits. Other: Fat herniates into the left inguinal canal without associated bowel. No other significant ventral hernia is present. No free fluid or free air is present. Musculoskeletal: A right subcapital femoral neck fracture is present. Fracture is displaced 1/2  shaft width with increased varus angulation. Acetabulum is intact. No acute pelvic fractures are present. Degenerative changes are present lumbar spine including grade 2 anterolisthesis at L5-S1 secondary to bilateral pars defects. IMPRESSION: 1. Right subcapital femoral neck fracture with displaced 1/2 shaft width with increased varus angulation. 2. Fat herniates into the left inguinal canal without associated bowel. 3. Grade 2 anterolisthesis at L5-S1 secondary to bilateral pars defects. 4. Aortic Atherosclerosis (ICD10-I70.0). These results were called by telephone at the time of interpretation on 06/09/2020 at 2:26 pm to provider Dr Dalene Seltzer, who verbally acknowledged these results. Electronically Signed   By: Marin Roberts M.D.   On: 06/09/2020 14:26   DG Chest Portable 1 View  Result Date: 06/09/2020 CLINICAL DATA:  Hip fracture EXAM: PORTABLE CHEST 1 VIEW COMPARISON:  2016 FINDINGS: The heart size and mediastinal contours are within normal limits. Mild chronic interstitial prominence. No new consolidation or edema. Right costophrenic angle is partially excluded. No pleural effusion or pneumothorax. The visualized skeletal structures are unremarkable. IMPRESSION: No acute process in the chest. Electronically Signed   By: Guadlupe Spanish M.D.   On: 06/09/2020 14:14   DG Knee Right Port  Result Date: 06/09/2020 CLINICAL DATA:  86 year old male with fall and right lower extremity pain. EXAM: PORTABLE RIGHT KNEE - 1-2 VIEW; DG HIP (WITH OR WITHOUT PELVIS) 1V PORT RIGHT COMPARISON:  Right hip radiograph dated 05/27/2020. FINDINGS: There is a displaced fracture of the right femoral neck with impaction and mild varus angulation. The bones are osteopenic. There is no dislocation. Mild degenerative changes of the hips and knee. No joint effusion. The soft tissues are unremarkable. IMPRESSION: Displaced fracture of the right femoral neck. Electronically Signed   By: Elgie Collard M.D.   On: 06/09/2020  16:17   DG HIP PORT UNILAT WITH PELVIS 1V RIGHT  Result Date: 06/09/2020 CLINICAL DATA:  86 year old male with fall and right lower extremity pain. EXAM: PORTABLE RIGHT KNEE - 1-2 VIEW; DG HIP (WITH OR WITHOUT PELVIS) 1V PORT RIGHT COMPARISON:  Right hip radiograph dated 05/27/2020. FINDINGS: There is a displaced fracture of the right femoral neck with impaction and mild varus angulation. The bones are osteopenic. There is no dislocation. Mild degenerative changes of the hips and knee. No joint effusion. The soft tissues are unremarkable. IMPRESSION: Displaced fracture of the right femoral neck. Electronically Signed   By: Elgie Collard M.D.   On: 06/09/2020 16:17    EKG: Independently reviewed.  Normal sinus rhythm with QTC 462 ms.     Assessment  and Plan:   Active Problems:   Hip fracture (HCC)    1.  Displaced right femoral neck fracture: Traumatic versus pathological in the setting of osteopenia.  Plan for surgical intervention in a.m.  N.p.o. after midnight.    Patient denies any falls, history of osteopenia listed under problem list.  Check vitamin D level.  Will order calcium/vitamin D.  Patient reports good exercise tolerance at baseline, can walk a block without dyspnea or chest pain.  Able to work in his garden and activities of daily living without problems.  Last stress test was many years back which apparently was done at a convention center as part of screening process.Vitals and labs stable currently.   Not on any antiplatelet/anticoagulants at baseline.Medically optimized for surgical intervention.   2.  Chronic normocytic anemia: Likely related to anemia of chronic disease.  Hemoglobin stable at baseline around 12-13.  3.  History of orthostatic hypotension: Patient denies any dizzy spells or falls leading to problem #1.  Normotensive at rest.  He does have prominent veins in lower extremities.  Support stockings ordered.  4. Osteopenia: Patient acknowledges history of  this but denies taking any medications.  5.   GERD, anxiety, PVD: Listed in problem list but patient denies any known history of these.  Not on any PPI or anxiolytics or aspirin at baseline.  States had seen his PCP 2 weeks ago with no acute issues identified.  6.  Delirium risk: Patient advised of delirium risk at his age with acute hospitalization, pain medication requirement and plan for anesthesia/surgical intervention.  He understands and states his friend Pat can be contacted for any emergency or medical dDennie Bibleecision making.  Will order standard delirium precautions and minimize opiate use as his pain seems to be fairly controlled.   DVT prophylaxis: SCD for now, Lovenox when okay per orthopedics  COVID screen: Negative  Code Status: Full code as confirmed with patient.Health care proxy would be his friend Garment/textile technologistat  Patient/Family Communication: Discussed with patient and all questions answered to satisfaction.  Consults called: Orthopedics Admission status :I certify that at the point of admission it is my clinical judgment that the patient will require inpatient hospital care spanning beyond 2 midnights from the point of admission due to high intensity of service and high frequency of surveillance required.Inpatient status is judged to be reasonable and necessary in order to provide the required intensity of service to ensure the patient's safety. The patient's presenting symptoms, physical exam findings, and initial radiographic and laboratory data in the context of their chronic comorbidities is felt to place them at high risk for further clinical deterioration. The following factors support the patient status of inpatient : Hip fracture requiring surgical intervention.      Alessandra BevelsNeelima Eurika Sandy MD [facility] Pager in [place]  If 7PM-7AM, please contact night-coverage [email or web address]   06/09/2020, 5:37 PM

## 2020-06-09 NOTE — ED Triage Notes (Signed)
Per EMS; pt was seen for leg pain 1 days ago and continues to have pain.  Pt woke up today and was unable to bear weight.  Pt denies any new injuries.

## 2020-06-09 NOTE — Plan of Care (Signed)
  Problem: Education: Goal: Verbalization of understanding the information provided (i.e., activity precautions, restrictions, etc) will improve Outcome: Progressing   Problem: Activity: Goal: Ability to ambulate and perform ADLs will improve Outcome: Progressing   

## 2020-06-09 NOTE — Care Plan (Signed)
Called from ED PA [facility] for transfer to [facility] for hip fracture   The patient is an elderly 86 year old who was out doing gardening and presented earlier last month on 05/27/2020 with hip pain.  X-rays at that time did not reveal any fracture.  Patient went home and today is unable to get up and bear weight on his leg.  Presented to the ED and found to have an acute hip fracture.  Orthopedic surgery has been consulted Dr. Yevette Edwards request the patient be admitted to [facility] for evaluation today and possible surgery tomorrow.  Denies taking any anticoagulation and does have a history of osteoporosis.  Currently hemodynamically stable.  Has a history of osteoporosis.  Accepted to MedSurg at [facility].

## 2020-06-10 ENCOUNTER — Encounter (HOSPITAL_COMMUNITY)
Admission: EM | Disposition: A | Discharge status: Home Health | Payer: Self-pay | Source: Home / Self Care | Attending: Internal Medicine

## 2020-06-10 ENCOUNTER — Encounter (HOSPITAL_COMMUNITY): Payer: Self-pay | Admitting: Internal Medicine

## 2020-06-10 ENCOUNTER — Inpatient Hospital Stay (HOSPITAL_COMMUNITY): Payer: Medicare Other | Admitting: Certified Registered Nurse Anesthetist

## 2020-06-10 ENCOUNTER — Inpatient Hospital Stay (HOSPITAL_COMMUNITY): Payer: Medicare Other

## 2020-06-10 DIAGNOSIS — K219 Gastro-esophageal reflux disease without esophagitis: Secondary | ICD-10-CM

## 2020-06-10 HISTORY — PX: TOTAL HIP ARTHROPLASTY: SHX124

## 2020-06-10 LAB — SURGICAL PCR SCREEN
MRSA, PCR: NEGATIVE
Staphylococcus aureus: POSITIVE — AB

## 2020-06-10 LAB — BASIC METABOLIC PANEL
Anion gap: 7 (ref 5–15)
BUN: 24 mg/dL — ABNORMAL HIGH (ref 8–23)
CO2: 25 mmol/L (ref 22–32)
Calcium: 9.1 mg/dL (ref 8.9–10.3)
Chloride: 103 mmol/L (ref 98–111)
Creatinine, Ser: 0.99 mg/dL (ref 0.61–1.24)
GFR calc Af Amer: >60 mL/min (ref >60)
GFR calc non Af Amer: >60 mL/min (ref >60)
Glucose, Bld: 169 mg/dL — ABNORMAL HIGH (ref 70–99)
Potassium: 4.5 mmol/L (ref 3.5–5.1)
Sodium: 135 mmol/L (ref 135–145)

## 2020-06-10 LAB — VITAMIN D 25 HYDROXY (VIT D DEFICIENCY, FRACTURES): Vit D, 25-Hydroxy: 16.83 ng/mL — ABNORMAL LOW (ref 30–100)

## 2020-06-10 SURGERY — ARTHROPLASTY, HIP, TOTAL, ANTERIOR APPROACH
Anesthesia: General | Site: Hip | Laterality: Right

## 2020-06-10 MED ORDER — LACTATED RINGERS IV SOLN: INTRAVENOUS | Status: DC

## 2020-06-10 MED ORDER — PROPOFOL 10 MG/ML IV BOLUS
INTRAVENOUS | Status: DC | PRN
  Administered 2020-06-10: 100 mg via INTRAVENOUS
  Administered 2020-06-10: 40 mg via INTRAVENOUS

## 2020-06-10 MED ORDER — 0.9 % SODIUM CHLORIDE (POUR BTL) OPTIME
TOPICAL | Status: DC | PRN
  Administered 2020-06-10: 1000 mL

## 2020-06-10 MED ORDER — KETOROLAC TROMETHAMINE 30 MG/ML IJ SOLN
INTRAMUSCULAR | Status: AC
  Filled 2020-06-10: qty 1

## 2020-06-10 MED ORDER — OXYCODONE HCL 5 MG/5ML PO SOLN: 5.0000 mg | Freq: Once | ORAL | Status: DC | PRN

## 2020-06-10 MED ORDER — ISOPROPYL ALCOHOL 70 % SOLN
Status: AC
  Filled 2020-06-10: qty 480

## 2020-06-10 MED ORDER — BUPIVACAINE-EPINEPHRINE 0.25% -1:200000 IJ SOLN
INTRAMUSCULAR | Status: DC | PRN
  Administered 2020-06-10: 30 mL

## 2020-06-10 MED ORDER — FENTANYL CITRATE (PF) 100 MCG/2ML IJ SOLN
INTRAMUSCULAR | Status: AC
  Administered 2020-06-10: 25 ug via INTRAVENOUS
  Filled 2020-06-10: qty 2

## 2020-06-10 MED ORDER — KETOROLAC TROMETHAMINE 30 MG/ML IJ SOLN
INTRAMUSCULAR | Status: DC | PRN
  Administered 2020-06-10: 30 mg

## 2020-06-10 MED ORDER — PROPOFOL 10 MG/ML IV BOLUS
INTRAVENOUS | Status: AC
  Filled 2020-06-10: qty 20

## 2020-06-10 MED ORDER — ROCURONIUM BROMIDE 10 MG/ML (PF) SYRINGE
PREFILLED_SYRINGE | INTRAVENOUS | Status: DC | PRN
  Administered 2020-06-10: 50 mg via INTRAVENOUS

## 2020-06-10 MED ORDER — FENTANYL CITRATE (PF) 100 MCG/2ML IJ SOLN
25.0000 ug | INTRAMUSCULAR | Status: DC | PRN
  Administered 2020-06-10: 25 ug via INTRAVENOUS

## 2020-06-10 MED ORDER — BUPIVACAINE-EPINEPHRINE (PF) 0.25% -1:200000 IJ SOLN
INTRAMUSCULAR | Status: AC
  Filled 2020-06-10: qty 30

## 2020-06-10 MED ORDER — FENTANYL CITRATE (PF) 100 MCG/2ML IJ SOLN
INTRAMUSCULAR | Status: DC | PRN
  Administered 2020-06-10 (×2): 100 ug via INTRAVENOUS
  Administered 2020-06-10 (×2): 50 ug via INTRAVENOUS
  Administered 2020-06-10 (×2): 25 ug via INTRAVENOUS

## 2020-06-10 MED ORDER — EPHEDRINE SULFATE-NACL 50-0.9 MG/10ML-% IV SOSY
PREFILLED_SYRINGE | INTRAVENOUS | Status: DC | PRN
  Administered 2020-06-10: 5 mg via INTRAVENOUS

## 2020-06-10 MED ORDER — FENTANYL CITRATE (PF) 250 MCG/5ML IJ SOLN
INTRAMUSCULAR | Status: AC
  Filled 2020-06-10: qty 5

## 2020-06-10 MED ORDER — OXYCODONE HCL 5 MG PO TABS: 5.0000 mg | ORAL_TABLET | Freq: Once | ORAL | Status: DC | PRN

## 2020-06-10 MED ORDER — TRANEXAMIC ACID-NACL 1000-0.7 MG/100ML-% IV SOLN
1000.0000 mg | INTRAVENOUS | Status: AC
  Administered 2020-06-10: 1000 mg via INTRAVENOUS
  Filled 2020-06-10: qty 100

## 2020-06-10 MED ORDER — POVIDONE-IODINE 10 % EX SWAB: 2.0000 "application " | Freq: Once | CUTANEOUS | Status: DC

## 2020-06-10 MED ORDER — LIDOCAINE 2% (20 MG/ML) 5 ML SYRINGE
INTRAMUSCULAR | Status: AC
  Filled 2020-06-10: qty 5

## 2020-06-10 MED ORDER — MUPIROCIN 2 % EX OINT
1.0000 "application " | TOPICAL_OINTMENT | Freq: Two times a day (BID) | CUTANEOUS | Status: DC
  Administered 2020-06-10 – 2020-06-12 (×6): 1 via NASAL
  Filled 2020-06-10 (×2): qty 22

## 2020-06-10 MED ORDER — SODIUM CHLORIDE (PF) 0.9 % IJ SOLN
INTRAMUSCULAR | Status: DC | PRN
  Administered 2020-06-10: 30 mL

## 2020-06-10 MED ORDER — CHLORHEXIDINE GLUCONATE 0.12 % MT SOLN
15.0000 mL | Freq: Once | OROMUCOSAL | Status: AC
  Administered 2020-06-10: 15 mL via OROMUCOSAL

## 2020-06-10 MED ORDER — WATER FOR IRRIGATION, STERILE IR SOLN
Status: DC | PRN
  Administered 2020-06-10: 1000 mL

## 2020-06-10 MED ORDER — ROCURONIUM BROMIDE 10 MG/ML (PF) SYRINGE
PREFILLED_SYRINGE | INTRAVENOUS | Status: AC
  Filled 2020-06-10: qty 10

## 2020-06-10 MED ORDER — DEXAMETHASONE SODIUM PHOSPHATE 10 MG/ML IJ SOLN
INTRAMUSCULAR | Status: AC
  Filled 2020-06-10: qty 1

## 2020-06-10 MED ORDER — LIDOCAINE 2% (20 MG/ML) 5 ML SYRINGE
INTRAMUSCULAR | Status: DC | PRN
  Administered 2020-06-10: 60 mg via INTRAVENOUS

## 2020-06-10 MED ORDER — SUGAMMADEX SODIUM 200 MG/2ML IV SOLN
INTRAVENOUS | Status: DC | PRN
  Administered 2020-06-10: 200 mg via INTRAVENOUS

## 2020-06-10 MED ORDER — VITAMIN D (ERGOCALCIFEROL) 1.25 MG (50000 UNIT) PO CAPS
50000.0000 [IU] | ORAL_CAPSULE | ORAL | Status: DC
  Administered 2020-06-10: 50000 [IU] via ORAL
  Filled 2020-06-10: qty 1

## 2020-06-10 MED ORDER — ISOPROPYL ALCOHOL 70 % SOLN
Status: DC | PRN
  Administered 2020-06-10: 1 via TOPICAL

## 2020-06-10 MED ORDER — ONDANSETRON HCL 4 MG/2ML IJ SOLN
INTRAMUSCULAR | Status: DC | PRN
  Administered 2020-06-10: 4 mg via INTRAVENOUS

## 2020-06-10 MED ORDER — ONDANSETRON HCL 4 MG/2ML IJ SOLN
INTRAMUSCULAR | Status: AC
  Filled 2020-06-10: qty 2

## 2020-06-10 MED ORDER — CHLORHEXIDINE GLUCONATE CLOTH 2 % EX PADS: 6.0000 | MEDICATED_PAD | Freq: Every day | CUTANEOUS | Status: DC

## 2020-06-10 MED ORDER — SODIUM CHLORIDE 0.9 % IR SOLN
Status: DC | PRN
  Administered 2020-06-10: 1

## 2020-06-10 MED ORDER — DEXAMETHASONE SODIUM PHOSPHATE 4 MG/ML IJ SOLN
INTRAMUSCULAR | Status: DC | PRN
  Administered 2020-06-10: 5 mg via INTRAVENOUS

## 2020-06-10 MED ORDER — SODIUM CHLORIDE (PF) 0.9 % IJ SOLN
INTRAMUSCULAR | Status: AC
  Filled 2020-06-10: qty 50

## 2020-06-10 MED ORDER — FENTANYL CITRATE (PF) 100 MCG/2ML IJ SOLN
INTRAMUSCULAR | Status: AC
  Filled 2020-06-10: qty 2

## 2020-06-10 MED ORDER — IRRISEPT - 450ML BOTTLE WITH 0.05% CHG IN STERILE WATER, USP 99.95% OPTIME
TOPICAL | Status: DC | PRN
  Administered 2020-06-10: 450 mL

## 2020-06-10 MED ORDER — EPHEDRINE 5 MG/ML INJ
INTRAVENOUS | Status: AC
  Filled 2020-06-10: qty 10

## 2020-06-10 MED FILL — Ondansetron HCl Inj 4 MG/2ML (2 MG/ML): INTRAMUSCULAR | Qty: 2 | Status: AC

## 2020-06-10 MED FILL — Sodium Chloride Flush IV Soln 0.9%: INTRAVENOUS | Qty: 10 | Status: AC

## 2020-06-10 SURGICAL SUPPLY — 66 items
ADH SKN CLS APL DERMABOND .7 (GAUZE/BANDAGES/DRESSINGS) ×1
APL PRP STRL LF DISP 70% ISPRP (MISCELLANEOUS) ×1
BAG DECANTER FOR FLEXI CONT (MISCELLANEOUS) IMPLANT
BAG SPEC THK2 15X12 ZIP CLS (MISCELLANEOUS)
BAG ZIPLOCK 12X15 (MISCELLANEOUS) IMPLANT
BLADE SURG SZ10 CARB STEEL (BLADE) IMPLANT
CHLORAPREP W/TINT 26 (MISCELLANEOUS) ×2 IMPLANT
COVER PERINEAL POST (MISCELLANEOUS) ×2 IMPLANT
COVER SURGICAL LIGHT HANDLE (MISCELLANEOUS) ×2 IMPLANT
COVER WAND RF STERILE (DRAPES) IMPLANT
CUP ACET PNNCL SECTR W/GRIP 56 (Hips) IMPLANT
DECANTER SPIKE VIAL GLASS SM (MISCELLANEOUS) ×2 IMPLANT
DERMABOND ADVANCED (GAUZE/BANDAGES/DRESSINGS) ×1
DERMABOND ADVANCED .7 DNX12 (GAUZE/BANDAGES/DRESSINGS) ×2 IMPLANT
DRAPE IMP U-DRAPE 54X76 (DRAPES) ×2 IMPLANT
DRAPE SHEET LG 3/4 BI-LAMINATE (DRAPES) ×6 IMPLANT
DRAPE STERI IOBAN 125X83 (DRAPES) IMPLANT
DRAPE U-SHAPE 47X51 STRL (DRAPES) ×4 IMPLANT
DRSG AQUACEL AG ADV 3.5X10 (GAUZE/BANDAGES/DRESSINGS) ×2 IMPLANT
ELECT REM PT RETURN 15FT ADLT (MISCELLANEOUS) ×2 IMPLANT
GAUZE SPONGE 4X4 12PLY STRL (GAUZE/BANDAGES/DRESSINGS) ×2 IMPLANT
GLOVE BIO SURGEON STRL SZ7.5 (GLOVE) ×2 IMPLANT
GLOVE BIO SURGEON STRL SZ8.5 (GLOVE) ×4 IMPLANT
GLOVE BIOGEL PI IND STRL 7.5 (GLOVE) ×1 IMPLANT
GLOVE BIOGEL PI IND STRL 8.5 (GLOVE) ×1 IMPLANT
GLOVE BIOGEL PI INDICATOR 7.5 (GLOVE) ×1
GLOVE BIOGEL PI INDICATOR 8.5 (GLOVE) ×1
GOWN SPEC L3 XXLG W/TWL (GOWN DISPOSABLE) ×2 IMPLANT
GOWN STRL REUS W/ TWL LRG LVL3 (GOWN DISPOSABLE) ×1 IMPLANT
GOWN STRL REUS W/TWL LRG LVL3 (GOWN DISPOSABLE) ×2
HANDPIECE INTERPULSE COAX TIP (DISPOSABLE) ×2
HEAD M SROM 36MM PLUS 1.5 (Hips) IMPLANT
HOLDER FOLEY CATH W/STRAP (MISCELLANEOUS) ×2 IMPLANT
HOOD PEEL AWAY FLYTE STAYCOOL (MISCELLANEOUS) ×8 IMPLANT
JET LAVAGE IRRISEPT WOUND (IRRIGATION / IRRIGATOR) ×2
KIT TURNOVER KIT A (KITS) IMPLANT
LAVAGE JET IRRISEPT WOUND (IRRIGATION / IRRIGATOR) ×1 IMPLANT
MANIFOLD NEPTUNE II (INSTRUMENTS) ×2 IMPLANT
MARKER SKIN DUAL TIP RULER LAB (MISCELLANEOUS) ×2 IMPLANT
NDL SAFETY ECLIPSE 18X1.5 (NEEDLE) ×1 IMPLANT
NDL SPNL 18GX3.5 QUINCKE PK (NEEDLE) ×1 IMPLANT
NEEDLE HYPO 18GX1.5 SHARP (NEEDLE) ×2
NEEDLE SPNL 18GX3.5 QUINCKE PK (NEEDLE) ×2 IMPLANT
PACK ANTERIOR HIP CUSTOM (KITS) ×2 IMPLANT
PENCIL SMOKE EVACUATOR (MISCELLANEOUS) IMPLANT
PINN SECTOR W/GRIP ACE CUP 56 (Hips) ×2 IMPLANT
PINNACLE ALTRX PLUS 4 N 36X56 (Hips) ×1 IMPLANT
SAW OSC TIP CART 19.5X105X1.3 (SAW) ×2 IMPLANT
SEALER BIPOLAR AQUA 6.0 (INSTRUMENTS) ×2 IMPLANT
SET HNDPC FAN SPRY TIP SCT (DISPOSABLE) ×1 IMPLANT
SROM M HEAD 36MM PLUS 1.5 (Hips) ×2 IMPLANT
STEM TRI LOC BPS GRIPTON SZ 10 IMPLANT
SUT ETHIBOND NAB CT1 #1 30IN (SUTURE) ×4 IMPLANT
SUT MNCRL AB 3-0 PS2 18 (SUTURE) ×2 IMPLANT
SUT MNCRL AB 4-0 PS2 18 (SUTURE) ×2 IMPLANT
SUT MON AB 2-0 CT1 36 (SUTURE) ×4 IMPLANT
SUT STRATAFIX PDO 1 14 VIOLET (SUTURE) ×2
SUT STRATFX PDO 1 14 VIOLET (SUTURE) ×1
SUT VIC AB 2-0 CT1 27 (SUTURE) ×2
SUT VIC AB 2-0 CT1 TAPERPNT 27 (SUTURE) ×1 IMPLANT
SUTURE STRATFX PDO 1 14 VIOLET (SUTURE) ×1 IMPLANT
SYR 3ML LL SCALE MARK (SYRINGE) ×2 IMPLANT
TRAY FOLEY MTR SLVR 16FR STAT (SET/KITS/TRAYS/PACK) IMPLANT
TRI LOC BPS W GRIPTON SZ 10 ×2 IMPLANT
WATER STERILE IRR 1000ML POUR (IV SOLUTION) ×2 IMPLANT
YANKAUER SUCT BULB TIP 10FT TU (MISCELLANEOUS) ×2 IMPLANT

## 2020-06-10 NOTE — TOC Initial Note (Addendum)
Transition of Care [facility]) - Initial/Assessment Note    Patient Details  Name: Bill Price MRN: [id number] Date of Birth: [birth year]  [facility]) CM/SW Contact:    Lia Hopping, [place] Phone Number: 06/10/2020, 11:16 AM  Clinical Narrative:    OR today, disposition to be determined.               CSW met with the patient at bedside, introduced role and reason for the visit. Patient states,  "I wasn't having any problems at all before this happen." CSW asked about the patient living situation. Patient reports his spouse died in 12-28-22 of this year, he lives alone with his pet cat.  Patient reports his family does not live local. He has 2 brothers and a niece. Patient is independent with his ADL's and still drives. Patient was ambulatory without assistance but has been using a rolling walking since having hip pain. Patient reports he is active with [place], [place].   TOC staff will continue to follow this patient for discharge needs.     Expected Discharge Plan: TBD ([place]) Barriers to Discharge: Continued Medical Work up   Patient Goals and CMS Choice Patient states their goals for this hospitalization and ongoing recovery are:: Pain to stop      Expected Discharge Plan and Services Expected Discharge Plan: [place] In-house Referral: NA Discharge Planning Services: NA   Living arrangements for the past 2 months: Single Family Home                                      Prior Living Arrangements/Services Living arrangements for the past 2 months: Single Family Home Lives with:: Self Patient language and need for interpreter reviewed:: No Do you feel safe going back to the place where you live?: Yes      Need for Family Participation in Patient Care: Yes (Comment) Care giver support system in place?: Yes (comment) (Family lives out of town.) Current home services: DME Criminal Activity/Legal Involvement Pertinent  to Current Situation/Hospitalization: No - Comment as needed  Activities of Daily Living Home Assistive Devices/Equipment: Eyeglasses, Hearing aid (right hearing aide, battery is dead on left ear) ADL Screening (condition at time of admission) Patient's cognitive ability adequate to safely complete daily activities?: Yes Is the patient deaf or have difficulty hearing?: Yes (wears right hearing aid) Does the patient have difficulty seeing, even when wearing glasses/contacts?: Yes (trouble seeing out of the left eye) Does the patient have difficulty concentrating, remembering, or making decisions?: No Patient able to express need for assistance with ADLs?: Yes Does the patient have difficulty dressing or bathing?: Yes Independently performs ADLs?: No Communication: Independent Dressing (OT): Independent Grooming: Independent Feeding: Independent Bathing: Needs assistance Is this a change from baseline?: Change from baseline, expected to last >3 days Toileting: Needs assistance Is this a change from baseline?: Change from baseline, expected to last >3days In/Out Bed: Needs assistance Is this a change from baseline?: Change from baseline, expected to last >3 days Walks in Home: Dependent Is this a change from baseline?: Change from baseline, expected to last >3 days Does the patient have difficulty walking or climbing stairs?: Yes Weakness of Legs: None Weakness of Arms/Hands: Right  Permission Sought/Granted Permission sought to share information with : Case Manager, Family Supports Permission granted to share information with : Yes, Verbal Permission Granted  Share Information with  NAME: EDISON,KATHY  Permission granted to share info w AGENCY: snf  Permission granted to share info w Relationship: Granddaughter  Permission granted to share info w Contact Information: [phone]  Emotional Assessment Appearance:: Appears stated age Attitude/Demeanor/Rapport: Engaged Affect  (typically observed): Accepting Orientation: : Oriented to Self, Oriented to Place, Oriented to  Time, Oriented to Situation Alcohol / Substance Use: Not Applicable Psych Involvement: No (comment)  Admission diagnosis:  Hip fracture ([place]) [S72.009A] Right hip pain [M25.551] Closed fracture of right hip, initial encounter ([place]) [S72.001A] Displaced fracture of right femoral neck ([place]) [S72.001A] Patient Active Problem List   Diagnosis Date Noted  . Hip fracture ([place]) 06/09/2020  . 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 ACTINIC KERATOSIS, HEAD 10/05/2010  . ANXIETY 06/04/2010  . IRRITABLE BOWEL SYNDROME 04/15/2010  . PERIPHERAL VASCULAR DISEASE 09/10/2009  . ALLERGIC RHINITIS 09/10/2009  . GERD 04/22/2009  . ARTHRITIS, HIP 05/15/2008  . OSTEOPENIA 01/17/2008  . HYPERLIPIDEMIA 09/21/2007  . RENAL CALCULUS, HX OF 09/21/2007   PCP:  Darreld Mclean, MD Pharmacy:   [id number] - [place], [place] - [address] [place] [address] [place] [address] Phone: [phone] Fax: [phone]     Social Determinants of Health (SDOH) Interventions    Readmission Risk Interventions No flowsheet data found.

## 2020-06-10 NOTE — Plan of Care (Signed)
Plan of care discussed with Patient and [facility] POA Olegario Messier who called nurse and requested a phone call from the Doctor prior to getting a signature for surgical consent.  Patient able to sign.

## 2020-06-10 NOTE — Discharge Instructions (Signed)
°Dr. Gaurav Baldree °Joint Replacement Specialist °[facility] °[address] °[place], [place] [address] °[phone] ° ° °TOTAL HIP REPLACEMENT POSTOPERATIVE DIRECTIONS ° ° ° °Hip Rehabilitation, Guidelines Following Surgery  ° °WEIGHT BEARING °Weight bearing as tolerated with assist device (walker, cane, etc) as directed, use it as long as suggested by your surgeon or therapist, typically at least 4-6 weeks. ° °The results of a hip operation are greatly improved after range of motion and muscle strengthening exercises. Follow all safety measures which are given to protect your hip. If any of these exercises cause increased pain or swelling in your joint, decrease the amount until you are comfortable again. Then slowly increase the exercises. Call your caregiver if you have problems or questions.  ° °HOME CARE INSTRUCTIONS  °Most of the following instructions are designed to prevent the dislocation of your new hip.  °Remove items at home which could result in a fall. This includes throw rugs or furniture in walking pathways.  °Continue medications as instructed at time of discharge. °· You may have some home medications which will be placed on hold until you complete the course of blood thinner medication. °· You may start showering once you are discharged home. Do not remove your dressing. °Do not put on socks or shoes without following the instructions of your caregivers.   °Sit on chairs with arms. Use the chair arms to help push yourself up when arising.  °Arrange for the use of a toilet seat elevator so you are not sitting low.  °· Walk with walker as instructed.  °You may resume a sexual relationship in one month or when given the OK by your caregiver.  °Use walker as long as suggested by your caregivers.  °You may put full weight on your legs and walk as much as is comfortable. °Avoid periods of inactivity such as sitting longer than an hour when not asleep. This helps prevent  blood clots.  °You may return to work once you are cleared by your surgeon.  °Do not drive a car for 6 weeks or until released by your surgeon.  °Do not drive while taking narcotics.  °Wear elastic stockings for two weeks following surgery during the day but you may remove then at night.  °Make sure you keep all of your appointments after your operation with all of your doctors and caregivers. You should call the office at the above phone number and make an appointment for approximately two weeks after the date of your surgery. °Please pick up a stool softener and laxative for home use as long as you are requiring pain medications. °· ICE to the affected hip every three hours for 30 minutes at a time and then as needed for pain and swelling. Continue to use ice on the hip for pain and swelling from surgery. You may notice swelling that will progress down to the foot and ankle.  This is normal after surgery.  Elevate the leg when you are not up walking on it.   °It is important for you to complete the blood thinner medication as prescribed by your doctor. °· Continue to use the breathing machine which will help keep your temperature down.  It is common for your temperature to cycle up and down following surgery, especially at night when you are not up moving around and exerting yourself.  The breathing machine keeps your lungs expanded and your temperature down. ° °RANGE OF MOTION AND STRENGTHENING EXERCISES  °These exercises are   designed to help you keep full movement of your hip joint. Follow your caregiver's or physical therapist's instructions. Perform all exercises about fifteen times, three times per day or as directed. Exercise both hips, even if you have had only one joint replacement. These exercises can be done on a training (exercise) mat, on the floor, on a table or on a bed. Use whatever works the best and is most comfortable for you. Use music or television while you are exercising so that the exercises  are a pleasant break in your day. This will make your life better with the exercises acting as a break in routine you can look forward to.  °Lying on your back, slowly slide your foot toward your buttocks, raising your knee up off the floor. Then slowly slide your foot back down until your leg is straight again.  °Lying on your back spread your legs as far apart as you can without causing discomfort.  °Lying on your side, raise your upper leg and foot straight up from the floor as far as is comfortable. Slowly lower the leg and repeat.  °Lying on your back, tighten up the muscle in the front of your thigh (quadriceps muscles). You can do this by keeping your leg straight and trying to raise your heel off the floor. This helps strengthen the largest muscle supporting your knee.  °Lying on your back, tighten up the muscles of your buttocks both with the legs straight and with the knee bent at a comfortable angle while keeping your heel on the floor.  ° °SKILLED REHAB INSTRUCTIONS: °If the patient is transferred to a skilled rehab facility following release from the hospital, a list of the current medications will be sent to the facility for the patient to continue.  When discharged from the skilled rehab facility, please have the facility set up the patient's Home Health Physical Therapy prior to being released. Also, the skilled facility will be responsible for providing the patient with their medications at time of release from the facility to include their pain medication and their blood thinner medication. If the patient is still at the rehab facility at time of the two week follow up appointment, the skilled rehab facility will also need to assist the patient in arranging follow up appointment in our office and any transportation needs. ° °MAKE SURE YOU:  °Understand these instructions.  °Will watch your condition.  °Will get help right away if you are not doing well or get worse. ° °Pick up stool softner and  laxative for home use following surgery while on pain medications. °Do not remove your dressing. °The dressing is waterproof--it is OK to take showers. °Continue to use ice for pain and swelling after surgery. °Do not use any lotions or creams on the incision until instructed by your surgeon. °Total Hip Protocol. ° ° °

## 2020-06-10 NOTE — Anesthesia Preprocedure Evaluation (Signed)
Anesthesia Evaluation  Patient identified by MRN, date of birth, ID band Patient awake    Reviewed: Allergy & Precautions, NPO status , Patient's Chart, lab work & pertinent test results  Airway Mallampati: II  TM Distance: >3 FB Neck ROM: Limited    Dental no notable dental hx.    Pulmonary neg pulmonary ROS,    Pulmonary exam normal breath sounds clear to auscultation       Cardiovascular + Peripheral Vascular Disease  Normal cardiovascular exam Rhythm:Regular Rate:Normal     Neuro/Psych negative neurological ROS  negative psych ROS   GI/Hepatic negative GI ROS, Neg liver ROS,   Endo/Other  Mildly elevated glucose, no diabetes diagnosis  Renal/GU negative Renal ROS  negative genitourinary   Musculoskeletal negative musculoskeletal ROS (+)   Abdominal   Peds negative pediatric ROS (+)  Hematology negative hematology ROS (+)   Anesthesia Other Findings   Reproductive/Obstetrics negative OB ROS                             Anesthesia Physical Anesthesia Plan  ASA: II  Anesthesia Plan: General   Post-op Pain Management:    Induction: Intravenous  PONV Risk Score and Plan: 2 and Ondansetron, Dexamethasone and Treatment may vary due to age or medical condition  Airway Management Planned: Oral ETT  Additional Equipment:   Intra-op Plan:   Post-operative Plan: Extubation in OR  Informed Consent: I have reviewed the patients History and Physical, chart, labs and discussed the procedure including the risks, benefits and alternatives for the proposed anesthesia with the patient or authorized representative who has indicated his/her understanding and acceptance.     Dental advisory given  Plan Discussed with: CRNA and Surgeon  Anesthesia Plan Comments:         Anesthesia Quick Evaluation

## 2020-06-10 NOTE — Progress Notes (Signed)
PROGRESS NOTE    Bill Price  WUX:[id number]  DOB: [birth year]  PCP: Pearline Cables, MD Admit date:06/09/2020 Chief compliant: Worsening right hip pain  86 y.o. male with history h/o hyperlipidemia, orthostatic hypotension, peripheral vascular disease, GERD, colon polyps, anxiety, osteopenia presents with complaints of worsening hip pain.   Patient states about a month back he might have hit his hip against something as he noted bruising and had some lingering pain. Patient was seen in the ED on 7/20 for complaints of severe right hip pain while trying to get up from chair.  Patient underwent x-rays which were negative for acute fracture, felt to have exacerbation of arthritis due to prolonged activity/gardening and discharged home with advised to take acetaminophen as needed--this seemed to help patient to some extent but was using his wife's walker to function.  Yesterday he had excruciating pain with inability to bear weight and presented to the [facility] ED where work-up now revealed right subcapital femoral neck fracture, displaced.   Hospital course: Patient transferred to [facility], admitted to [facility] with orthopedic consultation and plan for surgical intervention 8/3.  Patient on admission reported that he does not take any medications at home other than Tylenol as needed.  Fairly active at baseline   Subjective: Patient n.p.o. for planned hip surgery today.  Rates hip pain at 3/10.  Feels nauseous after pain medications.  Awake alert oriented x3 and saturating well on room air.  Objective: Vitals:   06/09/20 2227 06/10/20 0230 06/10/20 0536 06/10/20 1010  BP: 127/60 (!) 116/56 (!) 109/59 (!) 108/57  Pulse: 76 66 69 63  Resp: 18 14 16 17   Temp: 97.7 F (36.5 C) 98.4 F (36.9 C) 97.7 F (36.5 C) 98.4 F (36.9 C)  TempSrc: Oral Axillary Oral   SpO2: 98% 93% 95% 97%  Weight:      Height:        Intake/Output Summary (Last 24 hours) at 06/10/2020 1328 Last data filed at 06/10/2020  1020 Gross per 24 hour  Intake 1780 ml  Output 200 ml  Net 1580 ml   Filed Weights   06/09/20 1312  Weight: 78.5 kg    Physical Examination: General: Moderately built, no acute distress noted, in good spirits Head ENT: Atraumatic normocephalic, PERRLA, neck supple Heart: S1-S2 heard, regular rate and rhythm, no murmurs.  No leg edema noted Lungs: Equal air entry bilaterally, no rhonchi or rales on exam, no accessory muscle use Abdomen: Bowel sounds heard, soft, nontender, nondistended. No organomegaly.  No CVA tenderness Extremities: Prominent veins, no pedal edema.  No cyanosis or clubbing.  Right hip fracture with decreased range of motion  Neurological: Awake alert oriented x3, no focal weakness or numbness, strength and sensations to crude touch intact Skin: No wounds or rashes.  Data Reviewed: I have personally reviewed following labs and imaging studies  CBC: Recent Labs  Lab 06/09/20 1413  WBC 8.8  NEUTROABS 7.0  HGB 12.8*  HCT 38.7*  MCV 99.5  PLT 196   Basic Metabolic Panel: Recent Labs  Lab 06/09/20 1413 06/10/20 0332  NA 136 135  K 3.9 4.5  CL 100 103  CO2 24 25  GLUCOSE 164* 169*  BUN 25* 24*  CREATININE 0.95 0.99  CALCIUM 9.3 9.1   GFR: Estimated Creatinine Clearance: 48.9 mL/min (by C-G formula based on SCr of 0.99 mg/dL). Liver Function Tests: No results for input(s): AST, ALT, ALKPHOS, BILITOT, PROT, ALBUMIN in the last 168 hours. No results for input(s): LIPASE,  AMYLASE in the last 168 hours. No results for input(s): AMMONIA in the last 168 hours. Coagulation Profile: Recent Labs  Lab 06/09/20 1413  INR 1.0   Cardiac Enzymes: No results for input(s): CKTOTAL, CKMB, CKMBINDEX, TROPONINI in the last 168 hours. BNP (last 3 results) No results for input(s): PROBNP in the last 8760 hours. HbA1C: No results for input(s): HGBA1C in the last 72 hours. CBG: No results for input(s): GLUCAP in the last 168 hours. Lipid Profile: No results for  input(s): CHOL, HDL, LDLCALC, TRIG, CHOLHDL, LDLDIRECT in the last 72 hours. Thyroid Function Tests: No results for input(s): TSH, T4TOTAL, FREET4, T3FREE, THYROIDAB in the last 72 hours. Anemia Panel: No results for input(s): VITAMINB12, FOLATE, FERRITIN, TIBC, IRON, RETICCTPCT in the last 72 hours. Sepsis Labs: No results for input(s): PROCALCITON, LATICACIDVEN in the last 168 hours.  Recent Results (from the past 240 hour(s))  SARS Coronavirus 2 by RT PCR (hospital order, performed in [facility] hospital lab) Nasopharyngeal Nasopharyngeal Swab     Status: None   Collection Time: 06/09/20  2:13 PM   Specimen: Nasopharyngeal Swab  Result Value Ref Range Status   SARS Coronavirus 2 NEGATIVE NEGATIVE Final    Comment: (NOTE) SARS-CoV-2 target nucleic acids are NOT DETECTED.  The SARS-CoV-2 RNA is generally detectable in upper and lower respiratory specimens during the acute phase of infection. The lowest concentration of SARS-CoV-2 viral copies this assay can detect is 250 copies / mL. A negative result does not preclude SARS-CoV-2 infection and should not be used as the sole basis for treatment or other patient management decisions.  A negative result may occur with improper specimen collection / handling, submission of specimen other than nasopharyngeal swab, presence of viral mutation(s) within the areas targeted by this assay, and inadequate number of viral copies (<250 copies / mL). A negative result must be combined with clinical observations, patient history, and epidemiological information.  Fact Sheet for Patients:   BoilerBrush.com.cy  Fact Sheet for Healthcare Providers: https://pope.com/  This test is not yet approved or  cleared by the [place] FDA and has been authorized for detection and/or diagnosis of SARS-CoV-2 by FDA under an Emergency Use Authorization (EUA).  This EUA will remain in effect (meaning this  test can be used) for the duration of the COVID-19 declaration under Section 564(b)(1) of the Act, 21 U.S.C. section 360bbb-3(b)(1), unless the authorization is terminated or revoked sooner.  Performed at [facility], [address], [place], [place] [address]   Surgical PCR screen     Status: Abnormal   Collection Time: 06/09/20 10:17 PM   Specimen: Nasal Mucosa; Nasal Swab  Result Value Ref Range Status   MRSA, PCR NEGATIVE NEGATIVE Final   Staphylococcus aureus POSITIVE (A) NEGATIVE Final    Comment: (NOTE) The Xpert SA Assay (FDA approved for NASAL specimens in patients 86 years of age and older), is one component of a comprehensive surveillance program. It is not intended to diagnose infection nor to guide or monitor treatment. Performed at [facility], [address], [place], [place] [address]       Radiology Studies: CT PELVIS WO CONTRAST  Result Date: 06/09/2020 CLINICAL DATA:  Pelvis pain. Right hip and tenderness since July 20th. No known injury. EXAM: CT PELVIS WITHOUT CONTRAST TECHNIQUE: Multidetector CT imaging of the pelvis was performed following the standard protocol without intravenous contrast. COMPARISON:  Right hip radiographs 05/27/2020 FINDINGS: Urinary Tract: The distal ureters and urinary bladder  are within normal limits. Bowel:  Unremarkable visualized pelvic bowel loops. Vascular/Lymphatic: Atherosclerotic calcifications are present within the distal aorta and branch vessels without aneurysm or focal stenosis. Reproductive:  Prostate gland is within normal limits. Other: Fat herniates into the left inguinal canal without associated bowel. No other significant ventral hernia is present. No free fluid or free air is present. Musculoskeletal: A right subcapital femoral neck fracture is present. Fracture is displaced 1/2 shaft width with increased varus angulation. Acetabulum is intact. No acute pelvic fractures are present.  Degenerative changes are present lumbar spine including grade 2 anterolisthesis at L5-S1 secondary to bilateral pars defects. IMPRESSION: 1. Right subcapital femoral neck fracture with displaced 1/2 shaft width with increased varus angulation. 2. Fat herniates into the left inguinal canal without associated bowel. 3. Grade 2 anterolisthesis at L5-S1 secondary to bilateral pars defects. 4. Aortic Atherosclerosis (ICD10-I70.0). These results were called by telephone at the time of interpretation on 06/09/2020 at 2:26 pm to provider Dr Dalene Seltzer, who verbally acknowledged these results. Electronically Signed   By: Marin Roberts M.D.   On: 06/09/2020 14:26   DG Chest Portable 1 View  Result Date: 06/09/2020 CLINICAL DATA:  Hip fracture EXAM: PORTABLE CHEST 1 VIEW COMPARISON:  2016 FINDINGS: The heart size and mediastinal contours are within normal limits. Mild chronic interstitial prominence. No new consolidation or edema. Right costophrenic angle is partially excluded. No pleural effusion or pneumothorax. The visualized skeletal structures are unremarkable. IMPRESSION: No acute process in the chest. Electronically Signed   By: Guadlupe Spanish M.D.   On: 06/09/2020 14:14   DG Knee Right Port  Result Date: 06/09/2020 CLINICAL DATA:  86 year old male with fall and right lower extremity pain. EXAM: PORTABLE RIGHT KNEE - 1-2 VIEW; DG HIP (WITH OR WITHOUT PELVIS) 1V PORT RIGHT COMPARISON:  Right hip radiograph dated 05/27/2020. FINDINGS: There is a displaced fracture of the right femoral neck with impaction and mild varus angulation. The bones are osteopenic. There is no dislocation. Mild degenerative changes of the hips and knee. No joint effusion. The soft tissues are unremarkable. IMPRESSION: Displaced fracture of the right femoral neck. Electronically Signed   By: Elgie Collard M.D.   On: 06/09/2020 16:17   DG HIP PORT UNILAT WITH PELVIS 1V RIGHT  Result Date: 06/09/2020 CLINICAL DATA:  86 year old male  with fall and right lower extremity pain. EXAM: PORTABLE RIGHT KNEE - 1-2 VIEW; DG HIP (WITH OR WITHOUT PELVIS) 1V PORT RIGHT COMPARISON:  Right hip radiograph dated 05/27/2020. FINDINGS: There is a displaced fracture of the right femoral neck with impaction and mild varus angulation. The bones are osteopenic. There is no dislocation. Mild degenerative changes of the hips and knee. No joint effusion. The soft tissues are unremarkable. IMPRESSION: Displaced fracture of the right femoral neck. Electronically Signed   By: Elgie Collard M.D.   On: 06/09/2020 16:17      Scheduled Meds: . calcium-vitamin D  1 tablet Oral BID  . Chlorhexidine Gluconate Cloth  6 each Topical Daily  . mupirocin ointment  1 application Nasal BID  . pantoprazole  20 mg Oral Daily  . povidone-iodine  2 application Topical Once   Continuous Infusions: . sodium chloride 75 mL/hr at 06/10/20 0902  .  ceFAZolin (ANCEF) IV Stopped (06/10/20 1157)  . methocarbamol (ROBAXIN) IV    . tranexamic acid        Assessment/Plan:  1.  Displaced right femoral neck fracture: Traumatic versus pathological in the setting of osteopenia.  Plan  for surgical intervention today.  Started on calcium/vitamin D supplements.  Vitamin D level low at 16.  Patient reports good exercise tolerance at baseline, can walk a block without dyspnea or chest pain.  Able to work in his garden and perform activities of daily living without problems.  Last stress test was many years back which apparently was done [facility] a convention center as part of screening process.Vitals and labs stable currently.   Not on any antiplatelet/anticoagulants at baseline.Medically optimized for surgical intervention. Continue perioperative incentive spirometry and delirium precautions.      2.  Chronic normocytic anemia: Likely related to anemia of chronic disease.  Hemoglobin stable at baseline around 12-13.  3.  History of orthostatic hypotension: Patient denies any dizzy  spells or falls leading to problem #1.  Normotensive at rest.  He does have prominent veins in lower extremities.  Support stockings ordered.  4. Osteopenia: Patient acknowledges history of this but denies taking any medications.  Vitamin D level low, will need prescription at discharge  5.   GERD, anxiety, PVD: Listed in problem list but patient denies any known history of these.  Not on any PPI or anxiolytics or aspirin at baseline.  States had seen his PCP 2 weeks ago with no acute issues identified.  Started on low-dose PPI  6.  Delirium risk: Patient advised of delirium risk at his age with acute hospitalization, pain medication requirement and plan for anesthesia/surgical intervention.  He understands and states his friend Dennie Bibleat can be contacted for any emergency or medical decision making.  Will order standard delirium precautions and minimize opiate use as his pain seems to be fairly controlled.    DVT prophylaxis: Lovenox when okay per orthopedics, SCD for now Code Status: Full code as confirmed with patient Family / Patient Communication: Discussed with patient.  Called and updated his friend, Dennie Bibleat, per his request. Disposition Plan:   Status is: Inpatient  Remains inpatient appropriate because:Inpatient level of care appropriate due to severity of illness   Dispo: The patient is from: Home              Anticipated d/c is to: Likely will need rehab              Anticipated d/c date is: 2 days              Patient currently is not medically stable to d/c.          Time spent: 25 mins     >50% time spent in discussions with care team and coordination of care.    Alessandra BevelsNeelima Lux Meaders, MD [facility] Pager in [email or web address]  If 7PM-7AM, please contact night-coverage [email or web address] 06/10/2020, 1:28 PM

## 2020-06-10 NOTE — Anesthesia Postprocedure Evaluation (Signed)
Anesthesia Post Note  Patient: Bill Price  Procedure(s) Performed: TOTAL HIP ARTHROPLASTY ANTERIOR APPROACH (Right Hip)     Patient location during evaluation: PACU Anesthesia Type: General Level of consciousness: awake and alert Pain management: pain level controlled Vital Signs Assessment: post-procedure vital signs reviewed and stable Respiratory status: spontaneous breathing, nonlabored ventilation, respiratory function stable and patient connected to nasal cannula oxygen Cardiovascular status: blood pressure returned to baseline and stable Postop Assessment: no apparent nausea or vomiting Anesthetic complications: no   No complications documented.  Last Vitals:  Vitals:   06/10/20 1815 06/10/20 1830  BP: 139/63 137/61  Pulse: 89 93  Resp: 13 18  Temp:    SpO2: 99% 96%    Last Pain:  Vitals:   06/10/20 1830  TempSrc:   PainSc: 6                  Jammal Sarr S

## 2020-06-10 NOTE — Telephone Encounter (Signed)
Placed in providers folder for review.

## 2020-06-10 NOTE — Consult Note (Signed)
ORTHOPAEDIC CONSULTATION  REQUESTING PHYSICIAN: Alessandra Bevels, MD  PCP:  Pearline Cables, MD  Chief Complaint: Right hip injury  HPI: Bill Price is a 86 y.o. male who lives independently.  He has had progressive right hip pain over the last 2 to 3 weeks.  He hit his right hip while working in the garage.  He actually came to the emergency department a couple weeks ago, and x-rays were normal.  He had worsening hip pain and inability to weight-bear.  He went to [facility], and CT and radiographs revealed displaced right femoral neck fracture.  He was then admitted to the hospitalist service at [facility].  Orthopedic consultation was placed for management of right hip fracture.  Patient is very active.  He does his own grocery shopping.  He gardens.  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  Past Medical History:  Diagnosis Date  . Allergic rhinitis   . Anxiety   . Arthritis   . Colon polyp   . GERD (gastroesophageal reflux disease)   . Hemorrhoids   . Hyperlipidemia   . IBS (irritable bowel syndrome)   . Impotence   . Kidney stone   . Orthostatic hypotension   . Osteopenia   . PVD (peripheral vascular disease) (HCC)   . Urinary obstruction    Past Surgical History:  Procedure Laterality Date  . COLONOSCOPY  2007  . HERNIA REPAIR  2005   Social History   Socioeconomic History  . Marital status: Widowed    Spouse name: Not on file  . Number of children: 0  . Years of education: Not on file  . Highest education level: Not on file  Occupational History  . Occupation: Retired  Tobacco Use  . Smoking status: Never Smoker  . Smokeless tobacco: Never Used  Substance and Sexual Activity  . Alcohol use: Yes    Alcohol/week: 0.0 standard drinks    Comment: 1 glass of wine a day  . Drug use: No  . Sexual activity: Never  Other Topics Concern  . Not on file  Social History Narrative  . Not on file   Social Determinants of Health   Financial  Resource Strain: Low Risk   . Difficulty of Paying Living Expenses: Not hard at all  Food Insecurity: No Food Insecurity  . Worried About Programme researcher, broadcasting/film/video in the Last Year: Never true  . Ran Out of Food in the Last Year: Never true  Transportation Needs: No Transportation Needs  . Lack of Transportation (Medical): No  . Lack of Transportation (Non-Medical): No  Physical Activity: Inactive  . Days of Exercise per Week: 0 days  . Minutes of Exercise per Session: 0 min  Stress: No Stress Concern Present  . Feeling of Stress : Not at all  Social Connections: Moderately Isolated  . Frequency of Communication with Friends and Family: Once a week  . Frequency of Social Gatherings with Friends and Family: Once a week  . Attends Religious Services: More than 4 times per year  . Active Member of Clubs or Organizations: Yes  . Attends Banker Meetings: More than 4 times per year  . Marital Status: Widowed   Family History  Problem Relation Age of Onset  . Prostate cancer Brother   . Colon cancer Neg Hx   . Colon polyps Neg Hx   . Diabetes Neg Hx   . Kidney disease Neg Hx   . Esophageal cancer Neg  Hx   . Gallbladder disease Neg Hx   . Heart disease Neg Hx    Allergies  Allergen Reactions  . Augmentin [Amoxicillin-Pot Clavulanate] Diarrhea  . Crestor [Rosuvastatin]     Shaky, hot flashes   Prior to Admission medications   Medication Sig Start Date End Date Taking? Authorizing Provider  acetaminophen (TYLENOL) 325 MG tablet Take 650 mg by mouth every 6 (six) hours as needed for mild pain.   Yes [provider]   CT PELVIS WO CONTRAST  Result Date: 06/09/2020 CLINICAL DATA:  Pelvis pain. Right hip and tenderness since July 20th. No known injury. EXAM: CT PELVIS WITHOUT CONTRAST TECHNIQUE: Multidetector CT imaging of the pelvis was performed following the standard protocol without intravenous contrast. COMPARISON:  Right hip radiographs 05/27/2020 FINDINGS: Urinary  Tract: The distal ureters and urinary bladder are within normal limits. Bowel:  Unremarkable visualized pelvic bowel loops. Vascular/Lymphatic: Atherosclerotic calcifications are present within the distal aorta and branch vessels without aneurysm or focal stenosis. Reproductive:  Prostate gland is within normal limits. Other: Fat herniates into the left inguinal canal without associated bowel. No other significant ventral hernia is present. No free fluid or free air is present. Musculoskeletal: A right subcapital femoral neck fracture is present. Fracture is displaced 1/2 shaft width with increased varus angulation. Acetabulum is intact. No acute pelvic fractures are present. Degenerative changes are present lumbar spine including grade 2 anterolisthesis at L5-S1 secondary to bilateral pars defects. IMPRESSION: 1. Right subcapital femoral neck fracture with displaced 1/2 shaft width with increased varus angulation. 2. Fat herniates into the left inguinal canal without associated bowel. 3. Grade 2 anterolisthesis at L5-S1 secondary to bilateral pars defects. 4. Aortic Atherosclerosis (ICD10-I70.0). These results were called by telephone at the time of interpretation on 06/09/2020 at 2:26 pm to provider Dr Dalene Seltzer, who verbally acknowledged these results. Electronically Signed   By: Marin Roberts M.D.   On: 06/09/2020 14:26   DG Chest Portable 1 View  Result Date: 06/09/2020 CLINICAL DATA:  Hip fracture EXAM: PORTABLE CHEST 1 VIEW COMPARISON:  2016 FINDINGS: The heart size and mediastinal contours are within normal limits. Mild chronic interstitial prominence. No new consolidation or edema. Right costophrenic angle is partially excluded. No pleural effusion or pneumothorax. The visualized skeletal structures are unremarkable. IMPRESSION: No acute process in the chest. Electronically Signed   By: Guadlupe Spanish M.D.   On: 06/09/2020 14:14   DG Knee Right Port  Result Date: 06/09/2020 CLINICAL DATA:   86 year old male with fall and right lower extremity pain. EXAM: PORTABLE RIGHT KNEE - 1-2 VIEW; DG HIP (WITH OR WITHOUT PELVIS) 1V PORT RIGHT COMPARISON:  Right hip radiograph dated 05/27/2020. FINDINGS: There is a displaced fracture of the right femoral neck with impaction and mild varus angulation. The bones are osteopenic. There is no dislocation. Mild degenerative changes of the hips and knee. No joint effusion. The soft tissues are unremarkable. IMPRESSION: Displaced fracture of the right femoral neck. Electronically Signed   By: Elgie Collard M.D.   On: 06/09/2020 16:17   DG HIP PORT UNILAT WITH PELVIS 1V RIGHT  Result Date: 06/09/2020 CLINICAL DATA:  86 year old male with fall and right lower extremity pain. EXAM: PORTABLE RIGHT KNEE - 1-2 VIEW; DG HIP (WITH OR WITHOUT PELVIS) 1V PORT RIGHT COMPARISON:  Right hip radiograph dated 05/27/2020. FINDINGS: There is a displaced fracture of the right femoral neck with impaction and mild varus angulation. The bones are osteopenic. There is no dislocation. Mild degenerative changes  of the hips and knee. No joint effusion. The soft tissues are unremarkable. IMPRESSION: Displaced fracture of the right femoral neck. Electronically Signed   By: Elgie Collard M.D.   On: 06/09/2020 16:17    Positive ROS: All other systems have been reviewed and were otherwise negative with the exception of those mentioned in the HPI and as above.  Physical Exam: General: Alert, no acute distress Cardiovascular: No pedal edema Respiratory: No cyanosis, no use of accessory musculature GI: No organomegaly, abdomen is soft and non-tender Skin: No lesions in the area of chief complaint Neurologic: Sensation intact distally Psychiatric: Patient is competent for consent with normal mood and affect Lymphatic: No axillary or cervical lymphadenopathy  MUSCULOSKELETAL: Examination of the right hip reveals no skin wounds or lesions.  He is shortened and externally rotated.   Neurovascular intact distally.  Assessment: Displaced right femoral neck fracture  Plan: I discussed the findings with the patient.  He has an unstable hip fracture.  We discussed that the most durable solution to immediately help with pain and restore mobility is a total hip replacement.  We discussed the risk, benefits, and alternatives.  Please see statement of risk.  Also talked to his granddaughter, Olegario Messier, over the phone.  Plan for surgery today.  All questions were solicited and answered.  The risks, benefits, and alternatives were discussed with the patient. There are risks associated with the surgery including, but not limited to, problems with anesthesia (death), infection, instability (giving out of the joint), dislocation, differences in leg length/angulation/rotation, fracture of bones, loosening or failure of implants, hematoma (blood accumulation) which may require surgical drainage, blood clots, pulmonary embolism, nerve injury (foot drop and lateral thigh numbness), and blood vessel injury. The patient understands these risks and elects to proceed.   Jonette Pesa, MD [phone]    06/10/2020 1:31 PM

## 2020-06-10 NOTE — Transfer of Care (Signed)
Immediate Anesthesia Transfer of Care Note  Patient: Bill Price  Procedure(s) Performed: TOTAL HIP ARTHROPLASTY ANTERIOR APPROACH (Right Hip)  Patient Location: PACU  Anesthesia Type:General  Level of Consciousness: awake and patient cooperative  Airway & Oxygen Therapy: Patient Spontanous Breathing and Patient connected to face mask  Post-op Assessment: Report given to RN and Post -op Vital signs reviewed and stable  Post vital signs: Reviewed and stable  Last Vitals:  Vitals Value Taken Time  BP 140/70 06/10/20 1809  Temp    Pulse 92 06/10/20 1810  Resp 13 06/10/20 1810  SpO2 100 % 06/10/20 1810  Vitals shown include unvalidated device data.  Last Pain:  Vitals:   06/10/20 1506  TempSrc:   PainSc: 0-No pain      Patients Stated Pain Goal: 3 (06/09/20 1930)  Complications: No complications documented.

## 2020-06-10 NOTE — Op Note (Signed)
OPERATIVE REPORT  SURGEON: Samson Frederic, MD   ASSISTANT: Barrie Dunker, PA-C  PREOPERATIVE DIAGNOSIS: Displaced Right femoral neck fracture.   POSTOPERATIVE DIAGNOSIS: Displaced Right femoral neck fracture.   PROCEDURE: Right total hip arthroplasty, anterior approach.   IMPLANTS: DePuy Tri Lock stem, size 10, hi offset. DePuy Pinnacle Cup, size 56 mm. DePuy Altrx liner, size 56 by 36 mm, +4 neutral. DePuy metal head ball, size 36 + 1.5 mm.  ANESTHESIA:  General  ANTIBIOTICS: 2g ancef.  ESTIMATED BLOOD LOSS:-300 mL    DRAINS: None.  SPECIMENS: Right femoral head and neck remnant for pathology.  COMPLICATIONS: None   CONDITION: PACU - hemodynamically stable.   BRIEF CLINICAL NOTE: Bill Price is a 86 y.o. male with a displaced Right femoral neck fracture. The patient was admitted to the hospitalist service and underwent perioperative risk stratification and medical optimization. The risks, benefits, and alternatives to total hip arthroplasty were explained, and the patient elected to proceed.  PROCEDURE IN DETAIL: The patient was taken to the operating room and general anesthesia was induced on the hospital bed.  The patient was then positioned on the Hana table.  All bony prominences were well padded.  The hip was prepped and draped in the normal sterile surgical fashion.  A time-out was called verifying side and site of surgery. Antibiotics were given within 60 minutes of beginning the procedure.  The direct anterior approach to the hip was performed through the Hueter interval.  Lateral femoral circumflex vessels were treated with the Auqumantys. The anterior capsule was exposed and an inverted T capsulotomy was made.  Fracture hematoma was encountered and evacuated. The patient was found to have a comminuted Right subcapital femoral neck fracture.  I freshened the femoral neck cut with a saw.  I removed the femoral neck fragment.  A corkscrew was placed into the  head and the head was removed.  This was passed to the back table and was measured. There was no obvious osseous lesion.The head and neck remnant were sent for pathology.    Acetabular exposure was achieved, and the pulvinar and labrum were excised. Sequential reaming of the acetabulum was then performed up to a size 55 mm reamer. A 56 mm cup was then opened and impacted into place at approximately 40 degrees of abduction and 20 degrees of anteversion. The final polyethylene liner was impacted into place.    I then gained femoral exposure taking care to protect the abductors and greater trochanter.  This was performed using standard external rotation, extension, and adduction.  The capsule was peeled off the inner aspect of the greater trochanter, taking care to preserve the short external rotators. A cookie cutter was used to enter the femoral canal, and then the femoral canal finder was used to confirm location.  I then sequentially broached up to a size 10.  Calcar planer was used on the femoral neck remnant.  I paced a hi neck and a trial head ball. The hip was reduced.  Leg lengths were checked fluoroscopically.  The hip was dislocated and trial components were removed.  I placed the real stem followed by the real spacer and head ball.  The hip was reduced.  Fluoroscopy was used to confirm component position and leg lengths.  At 90 degrees of external rotation and extension, the hip was stable to an anterior directed force.   The wound was copiously irrigated with Irrisept solution and normal saline using pule lavage.  Marcaine solution was injected into  the periarticular soft tissue.  The wound was closed in layers using #1 Vicryl and V-Loc for the fascia, 2-0 Vicryl for the subcutaneous fat, 2-0 Monocryl for the deep dermal layer, 3-0 running Monocryl subcuticular stitch and glue for the skin.  Once the glue was fully dried, an Aquacell Ag dressing was applied.  The patient was then awakened from  anesthesia and transported to the recovery room in stable condition.  Sponge, needle, and instrument counts were correct at the end of the case x2.  The patient tolerated the procedure well and there were no known complications.  Please note that a surgical assistant was a medical necessity for this procedure to perform it in a safe and expeditious manner. Assistant was necessary to provide appropriate retraction of vital neurovascular structures, to prevent femoral fracture, and to allow for anatomic placement of the prosthesis.

## 2020-06-10 NOTE — Anesthesia Procedure Notes (Signed)
Procedure Name: Intubation Date/Time: 06/10/2020 3:35 PM Performed by: Vanessa [place], CRNA Pre-anesthesia Checklist: Patient identified, Emergency Drugs available, Suction available and Patient being monitored Patient Re-evaluated:Patient Re-evaluated prior to induction Oxygen Delivery Method: Circle system utilized Preoxygenation: Pre-oxygenation with 100% oxygen Induction Type: IV induction Ventilation: Mask ventilation without difficulty Laryngoscope Size: 2 and Miller Grade View: Grade II Tube type: Oral Tube size: 7.5 mm Number of attempts: 1 Airway Equipment and Method: Stylet Placement Confirmation: ETT inserted through vocal cords under direct vision,  positive ETCO2 and breath sounds checked- equal and bilateral Secured at: 22 cm Tube secured with: Tape Dental Injury: Teeth and Oropharynx as per pre-operative assessment  Difficulty Due To: Difficulty was anticipated and Difficult Airway- due to anterior larynx Comments: Very anterior, grade 2 view obtained with laryngeal pressure

## 2020-06-11 ENCOUNTER — Encounter (HOSPITAL_COMMUNITY): Payer: Self-pay | Admitting: Orthopedic Surgery

## 2020-06-11 DIAGNOSIS — I951 Orthostatic hypotension: Secondary | ICD-10-CM

## 2020-06-11 DIAGNOSIS — I739 Peripheral vascular disease, unspecified: Secondary | ICD-10-CM

## 2020-06-11 DIAGNOSIS — S72001A Fracture of unspecified part of neck of right femur, initial encounter for closed fracture: Secondary | ICD-10-CM

## 2020-06-11 LAB — CBC
HCT: 31.7 % — ABNORMAL LOW (ref 39.0–52.0)
Hemoglobin: 10 g/dL — ABNORMAL LOW (ref 13.0–17.0)
MCH: 33 pg (ref 26.0–34.0)
MCHC: 31.5 g/dL (ref 30.0–36.0)
MCV: 104.6 fL — ABNORMAL HIGH (ref 80.0–100.0)
Platelets: 157 10*3/uL (ref 150–400)
RBC: 3.03 MIL/uL — ABNORMAL LOW (ref 4.22–5.81)
RDW: 12.2 % (ref 11.5–15.5)
WBC: 13.1 10*3/uL — ABNORMAL HIGH (ref 4.0–10.5)
nRBC: 0 % (ref 0.0–0.2)

## 2020-06-11 LAB — BASIC METABOLIC PANEL
Anion gap: 7 (ref 5–15)
BUN: 28 mg/dL — ABNORMAL HIGH (ref 8–23)
CO2: 23 mmol/L (ref 22–32)
Calcium: 8.6 mg/dL — ABNORMAL LOW (ref 8.9–10.3)
Chloride: 103 mmol/L (ref 98–111)
Creatinine, Ser: 1.12 mg/dL (ref 0.61–1.24)
GFR calc Af Amer: >60 mL/min (ref >60)
GFR calc non Af Amer: 58 mL/min — ABNORMAL LOW (ref >60)
Glucose, Bld: 181 mg/dL — ABNORMAL HIGH (ref 70–99)
Potassium: 4.6 mmol/L (ref 3.5–5.1)
Sodium: 133 mmol/L — ABNORMAL LOW (ref 135–145)

## 2020-06-11 MED ORDER — MENTHOL 3 MG MT LOZG: 1.0000 | LOZENGE | OROMUCOSAL | Status: DC | PRN

## 2020-06-11 MED ORDER — PHENOL 1.4 % MT LIQD: 1.0000 | OROMUCOSAL | Status: DC | PRN

## 2020-06-11 MED ORDER — ASPIRIN EC 325 MG PO TBEC
325.0000 mg | DELAYED_RELEASE_TABLET | Freq: Every day | ORAL | Status: DC
  Administered 2020-06-11 – 2020-06-12 (×2): 325 mg via ORAL
  Filled 2020-06-11 (×2): qty 1

## 2020-06-11 MED ORDER — SODIUM CHLORIDE 0.9 % IV BOLUS
500.0000 mL | Freq: Once | INTRAVENOUS | Status: AC
  Administered 2020-06-11: 500 mL via INTRAVENOUS

## 2020-06-11 MED ORDER — CEFAZOLIN SODIUM-DEXTROSE 2-4 GM/100ML-% IV SOLN
2.0000 g | Freq: Four times a day (QID) | INTRAVENOUS | Status: AC
  Administered 2020-06-11 (×2): 2 g via INTRAVENOUS
  Filled 2020-06-11 (×2): qty 100

## 2020-06-11 MED ORDER — METOCLOPRAMIDE HCL 5 MG PO TABS: 5.0000 mg | ORAL_TABLET | Freq: Three times a day (TID) | ORAL | Status: DC | PRN

## 2020-06-11 MED ORDER — SODIUM CHLORIDE 0.9 % IV SOLN: INTRAVENOUS | Status: DC

## 2020-06-11 MED ORDER — DOCUSATE SODIUM 100 MG PO CAPS
100.0000 mg | ORAL_CAPSULE | Freq: Two times a day (BID) | ORAL | Status: DC
  Administered 2020-06-11 – 2020-06-12 (×3): 100 mg via ORAL
  Filled 2020-06-11 (×3): qty 1

## 2020-06-11 MED ORDER — METOCLOPRAMIDE HCL 5 MG/ML IJ SOLN: 5.0000 mg | Freq: Three times a day (TID) | INTRAMUSCULAR | Status: DC | PRN

## 2020-06-11 MED ORDER — ONDANSETRON HCL 4 MG PO TABS: 4.0000 mg | ORAL_TABLET | Freq: Four times a day (QID) | ORAL | Status: DC | PRN

## 2020-06-11 MED ORDER — ONDANSETRON HCL 4 MG/2ML IJ SOLN: 4.0000 mg | Freq: Four times a day (QID) | INTRAMUSCULAR | Status: DC | PRN

## 2020-06-11 NOTE — Evaluation (Signed)
Physical Therapy Evaluation Patient Details Name: Bill Price MRN: [id number] DOB: 1928/12/05 Today's Date: 06/11/2020   History of Present Illness  Pt is an 86 year old male admitted Displaced Right femoral neck fracture and s/p Right THA direct anterior approach  Clinical Impression  Pt admitted with above diagnosis.  Pt currently with functional limitations due to the deficits listed below (see PT Problem List). Pt will benefit from skilled PT to increase their independence and safety with mobility to allow discharge to the venue listed below.  Pt initiated mobility however had low BP upon standing so returned to bed.  Pt would like to return home upon d/c however lives alone.  Per [facility] team, granddaughter plans to assist pt at home upon d/c.     Follow Up Recommendations Home health PT;Supervision/Assistance - 24 hour    Equipment Recommendations  None recommended by PT    Recommendations for Other Services       Precautions / Restrictions Precautions Precautions: Fall Precaution Comments: direct anterior approach Restrictions Weight Bearing Restrictions: No RLE Weight Bearing: Weight bearing as tolerated      Mobility  Bed Mobility Overal bed mobility: Needs Assistance Bed Mobility: Supine to Sit;Sit to Supine     Supine to sit: Mod assist Sit to supine: Mod assist   General bed mobility comments: pt assisting as able, requiring increased time, assist mostly for lower body, utilized bed pad for scooting  Transfers Overall transfer level: Needs assistance Equipment used: Rolling walker (2 wheeled) Transfers: Sit to/from Stand Sit to Stand: Min assist         General transfer comment: assist to rise and steady, RN present and assisted with obtaining orthostatics which were positive so pt assisted back to supine  Ambulation/Gait                Stairs            Wheelchair Mobility    Modified Rankin (Stroke Patients Only)       Balance  Overall balance assessment: History of Falls                                           Pertinent Vitals/Pain Pain Assessment: Faces Faces Pain Scale: Hurts little more Pain Location: right hip Pain Descriptors / Indicators: Sore;Grimacing;Aching;Guarding Pain Intervention(s): Repositioned;Monitored during session    Home Living Family/patient expects to be discharged to:: Private residence Living Arrangements: Alone Available Help at Discharge: Family;Available 24 hours/day Type of Home: House Home Access: Stairs to enter     Home Layout: Two level Home Equipment: Environmental consultant - 2 wheels;Wheelchair - manual Additional Comments: granddaughter to come stay with pt per [facility] team    Prior Function Level of Independence: Independent               Hand Dominance        Extremity/Trunk Assessment        Lower Extremity Assessment Lower Extremity Assessment: RLE deficits/detail RLE Deficits / Details: requiring assist for R LE bed mobility, observed grossly 2+/5 hip strength       Communication   Communication: HOH  Cognition Arousal/Alertness: Awake/alert Behavior During Therapy: WFL for tasks assessed/performed Overall Cognitive Status: Within Functional Limits for tasks assessed  General Comments      Exercises     Assessment/Plan    PT Assessment Patient needs continued PT services  PT Problem List Decreased range of motion;Decreased strength;Decreased mobility;Decreased balance;Decreased knowledge of use of DME;Pain;Decreased knowledge of precautions       PT Treatment Interventions DME instruction;Gait training;Therapeutic exercise;Stair training;Functional mobility training;Therapeutic activities;Patient/family education    PT Goals (Current goals can be found in the Care Plan section)  Acute Rehab PT Goals Patient Stated Goal: home PT Goal Formulation: With patient Time For Goal  Achievement: 06/16/20 Potential to Achieve Goals: Good    Frequency 7X/week   Barriers to discharge        Co-evaluation               AM-PAC PT "6 Clicks" Mobility  Outcome Measure Help needed turning from your back to your side while in a flat bed without using bedrails?: A Lot Help needed moving from lying on your back to sitting on the side of a flat bed without using bedrails?: A Lot Help needed moving to and from a bed to a chair (including a wheelchair)?: A Lot Help needed standing up from a chair using your arms (e.g., wheelchair or bedside chair)?: A Little Help needed to walk in hospital room?: A Lot Help needed climbing 3-5 steps with a railing? : A Lot 6 Click Score: 13    End of Session Equipment Utilized During Treatment: Gait belt Activity Tolerance: Patient tolerated treatment well Patient left: in bed;with call bell/phone within reach;with bed alarm set Nurse Communication: Mobility status PT Visit Diagnosis: Other abnormalities of gait and mobility (R26.89)    Time: 0354-6568 PT Time Calculation (min) (ACUTE ONLY): 18 min   Charges:   PT Evaluation $PT Eval Low Complexity: 1 Low        Kati PT, DPT Acute Rehabilitation Services Pager: [phone] Office: [phone]  Peytyn Trine,KATHrine E 06/11/2020, 11:24 AM

## 2020-06-11 NOTE — TOC Transition Note (Signed)
Transition of Care [facility]) - CM/SW Discharge Note   Patient Details  Name: Bill Price MRN: [id number] Date of Birth: 15-Aug-1929  [facility] CM/SW Contact:  Clearance Coots, LCSW Phone Number: 06/11/2020, 1:04 PM   Clinical Narrative:    Per PT patient progressing with therapy/can d/c home w/therapy.  CSW spoke with the patient granddaughter Bill Price to discuss the patient discharge. Patient granddaughter flight will arrive tomorrow and she will stay with patient for the next few weeks until the patient is stronger. She report the pt. will go home with caregiver Bill Price until the GD arrives.  CSW explain Home Health process for therapy PT/OT. GD agreeable. CSW confirm w/ Caregiver Bill Price at bedside the patient has a RW. She will verify if it has wheels. Patient will need a 3 in1.  CSW made a referral to [facility], start of care will be Friday. GD informed.     Final next level of care: Home w Home Health Services Barriers to Discharge: Continued Medical Work up   Patient Goals and CMS Choice Patient states their goals for this hospitalization and ongoing recovery are:: Pain to stop CMS Medicare.gov Compare Post Acute Care list provided to:: Patient Choice offered to / list presented to : Patient, [facility] POA / Guardian  Discharge Placement                       Discharge Plan and Services In-house Referral: NA Discharge Planning Services: NA Post Acute Care Choice: Home Health          DME Arranged: 3-N-1 DME Agency: [employer] Date DME Agency Contacted: 06/11/20 Time DME Agency Contacted: 1300 Representative spoke with at DME Agency: Bill Price HH Arranged: PT, OT   Date HH Agency Contacted: 06/11/20 Time HH Agency Contacted: 1048 Representative spoke with at [facility] Agency: Bill Price  Social Determinants of Health (SDOH) Interventions     Readmission Risk Interventions No flowsheet data found.

## 2020-06-11 NOTE — Progress Notes (Signed)
Physical Therapy Treatment Patient Details Name: Bill Price MRN: [id number] DOB: 1929-01-07 Today's Date: 06/11/2020    History of Present Illness Pt is an 86 year old male admitted Displaced Right femoral neck fracture and s/p Right THA direct anterior approach    PT Comments    Pt eager to mobilize and able to perform ambulation with min/guard assist for safety.  Continue to recommend HHPT and 24/7 supervision for safety upon d/c.   Follow Up Recommendations  Home health PT;Supervision/Assistance - 24 hour     Equipment Recommendations  None recommended by PT    Recommendations for Other Services       Precautions / Restrictions Precautions Precautions: Fall Precaution Comments: direct anterior approach Restrictions RLE Weight Bearing: Weight bearing as tolerated    Mobility  Bed Mobility Overal bed mobility: Needs Assistance Bed Mobility: Supine to Sit     Supine to sit: Min guard     General bed mobility comments: verbal cues for technique  Transfers Overall transfer level: Needs assistance Equipment used: Rolling walker (2 wheeled) Transfers: Sit to/from Stand Sit to Stand: Min guard         General transfer comment: verbal cues for hand placement, min/guard for safety  Ambulation/Gait Ambulation/Gait assistance: Min guard Gait Distance (Feet): 120 Feet Assistive device: Rolling walker (2 wheeled) Gait Pattern/deviations: Step-through pattern;Decreased stride length;Decreased stance time - right     General Gait Details: verbal cues for sequence, RW positioning, safe slow pace - pt's speed increased after half distance   Stairs             Wheelchair Mobility    Modified Rankin (Stroke Patients Only)       Balance                                            Cognition Arousal/Alertness: Awake/alert Behavior During Therapy: WFL for tasks assessed/performed Overall Cognitive Status: Within Functional Limits for  tasks assessed                                        Exercises      General Comments        Pertinent Vitals/Pain Pain Assessment: 0-10 Pain Score: 4  Pain Location: right hip Pain Descriptors / Indicators: Sore;Grimacing;Aching;Guarding Pain Intervention(s): Monitored during session;Repositioned    Home Living                      Prior Function            PT Goals (current goals can now be found in the care plan section) Progress towards PT goals: Progressing toward goals    Frequency    7X/week      PT Plan Current plan remains appropriate    Co-evaluation              AM-PAC PT "6 Clicks" Mobility   Outcome Measure  Help needed turning from your back to your side while in a flat bed without using bedrails?: A Little Help needed moving from lying on your back to sitting on the side of a flat bed without using bedrails?: A Little Help needed moving to and from a bed to a chair (including a wheelchair)?: A Little Help needed standing up from  a chair using your arms (e.g., wheelchair or bedside chair)?: A Little Help needed to walk in hospital room?: A Little Help needed climbing 3-5 steps with a railing? : A Little 6 Click Score: 18    End of Session Equipment Utilized During Treatment: Gait belt Activity Tolerance: Patient tolerated treatment well Patient left: in chair;with call bell/phone within reach;with chair alarm set   PT Visit Diagnosis: Other abnormalities of gait and mobility (R26.89)     Time: 4193-7902 PT Time Calculation (min) (ACUTE ONLY): 12 min  Charges:  $Gait Training: 8-22 mins                    Paulino Door, DPT Acute Rehabilitation Services Pager: [phone] Office: [phone]  Maida Sale E 06/11/2020, 3:18 PM

## 2020-06-11 NOTE — Progress Notes (Signed)
PROGRESS NOTE    Bill Price   WNI:[id number]  DOB: [birth year]  DOA: 06/09/2020 PCP: Pearline Cables, MD   Brief Narrative:  Bill Price 86 y.o.malewith history h/ohyperlipidemia, orthostatic hypotension, peripheral vascular disease, GERD, colon polyps, anxiety, osteopenia presents with complaints of worsening right hip pain.He has had this pain for a few weeks after he hit it while working in the garage. Initial work up showed no fracture but when pain got worse he presented to [facility] ED where he was found to have right femoral neck fracture.    Subjective: No complaints.     Assessment & Plan:   Principal Problem:   Closed displaced fracture of right femoral neck (HCC) - 8/3> right total hip arthroplasty - plan is home with HH, WBAT with walker and aspirin daily  Active Problems:   HYPOTENSION, ORTHOSTATIC - poor urine output today with SBP in 80s- given 500 cc NS and encouraged to drink fluids - add TEDS -cont NS at 75 cc/hr for today    PVD (peripheral vascular disease) (HCC)   Time spent in minutes: 35 DVT prophylaxis: Aspirin 325 mg daily Code Status: Full code Family Communication:  Disposition Plan:  Status is: Inpatient  Remains inpatient appropriate because:treating orthostatic hypotension and poor urine output.   Dispo: The patient is from: Home              Anticipated d/c is to: Home              Anticipated d/c date is: 2 days              Patient currently is not medically stable to d/c.      Consultants:   ortho Procedures:   Total hip replacement Antimicrobials:  Anti-infectives (From admission, onward)   Start     Dose/Rate Route Frequency Ordered Stop   06/11/20 0111  ceFAZolin (ANCEF) IVPB 2g/100 mL premix        2 g 200 mL/hr over 30 Minutes Intravenous Every 6 hours 06/11/20 0111 06/11/20 0726   06/10/20 0600  ceFAZolin (ANCEF) IVPB 2g/100 mL premix        2 g 200 mL/hr over 30 Minutes Intravenous On call to O.R.  06/09/20 1959 06/10/20 1543       Objective: Vitals:   06/11/20 0954 06/11/20 0956 06/11/20 1228 06/11/20 1711  BP: (!) 87/53 (!) 86/40 109/70 (!) 117/51  Pulse: 89 78 84 75  Resp: 20 20 18 18   Temp:   98.1 F (36.7 C) 97.9 F (36.6 C)  TempSrc:   Oral Oral  SpO2: 97%  98% 95%  Weight:      Height:        Intake/Output Summary (Last 24 hours) at 06/11/2020 1732 Last data filed at 06/11/2020 1436 Gross per 24 hour  Intake 2937.23 ml  Output 400 ml  Net 2537.23 ml   Filed Weights   06/09/20 1312  Weight: 78.5 kg    Examination: General exam: Appears comfortable  HEENT: PERRLA, oral mucosa moist, no sclera icterus or thrush Respiratory system: Clear to auscultation. Respiratory effort normal. Cardiovascular system: S1 & S2 heard, RRR.   Gastrointestinal system: Abdomen soft, non-tender, nondistended. Normal bowel sounds. Central nervous system: Alert and oriented. No focal neurological deficits. Extremities: No cyanosis, clubbing or edema Skin: No rashes or ulcers Psychiatry:  Mood & affect appropriate.     Data Reviewed: I have personally reviewed following labs and imaging studies  CBC: Recent Labs  Lab  06/09/20 1413 06/11/20 0311  WBC 8.8 13.1*  NEUTROABS 7.0  --   HGB 12.8* 10.0*  HCT 38.7* 31.7*  MCV 99.5 104.6*  PLT 196 157   Basic Metabolic Panel: Recent Labs  Lab 06/09/20 1413 06/10/20 0332 06/11/20 0311  NA 136 135 133*  K 3.9 4.5 4.6  CL 100 103 103  CO2 24 25 23   GLUCOSE 164* 169* 181*  BUN 25* 24* 28*  CREATININE 0.95 0.99 1.12  CALCIUM 9.3 9.1 8.6*   GFR: Estimated Creatinine Clearance: 43.3 mL/min (by C-G formula based on SCr of 1.12 mg/dL). Liver Function Tests: No results for input(s): AST, ALT, ALKPHOS, BILITOT, PROT, ALBUMIN in the last 168 hours. No results for input(s): LIPASE, AMYLASE in the last 168 hours. No results for input(s): AMMONIA in the last 168 hours. Coagulation Profile: Recent Labs  Lab 06/09/20 1413  INR  1.0   Cardiac Enzymes: No results for input(s): CKTOTAL, CKMB, CKMBINDEX, TROPONINI in the last 168 hours. BNP (last 3 results) No results for input(s): PROBNP in the last 8760 hours. HbA1C: No results for input(s): HGBA1C in the last 72 hours. CBG: No results for input(s): GLUCAP in the last 168 hours. Lipid Profile: No results for input(s): CHOL, HDL, LDLCALC, TRIG, CHOLHDL, LDLDIRECT in the last 72 hours. Thyroid Function Tests: No results for input(s): TSH, T4TOTAL, FREET4, T3FREE, THYROIDAB in the last 72 hours. Anemia Panel: No results for input(s): VITAMINB12, FOLATE, FERRITIN, TIBC, IRON, RETICCTPCT in the last 72 hours. Urine analysis:    Component Value Date/Time   BILIRUBINUR small (A) 10/09/2015 1503   BILIRUBINUR Small 10/24/2014 1004   KETONESUR negative 10/09/2015 1503   PROTEINUR negative 10/09/2015 1503   PROTEINUR Negative 10/24/2014 1004   UROBILINOGEN 0.2 10/09/2015 1503   NITRITE Negative 10/09/2015 1503   NITRITE Negative 10/24/2014 1004   LEUKOCYTESUR Negative 10/09/2015 1503   Sepsis Labs: @LABRCNTIP (procalcitonin:4,lacticidven:4) ) Recent Results (from the past 240 hour(s))  SARS Coronavirus 2 by RT PCR (hospital order, performed in [facility] hospital lab) Nasopharyngeal Nasopharyngeal Swab     Status: None   Collection Time: 06/09/20  2:13 PM   Specimen: Nasopharyngeal Swab  Result Value Ref Range Status   SARS Coronavirus 2 NEGATIVE NEGATIVE Final    Comment: (NOTE) SARS-CoV-2 target nucleic acids are NOT DETECTED.  The SARS-CoV-2 RNA is generally detectable in upper and lower respiratory specimens during the acute phase of infection. The lowest concentration of SARS-CoV-2 viral copies this assay can detect is 250 copies / mL. A negative result does not preclude SARS-CoV-2 infection and should not be used as the sole basis for treatment or other patient management decisions.  A negative result may occur with improper specimen collection /  handling, submission of specimen other than nasopharyngeal swab, presence of viral mutation(s) within the areas targeted by this assay, and inadequate number of viral copies (<250 copies / mL). A negative result must be combined with clinical observations, patient history, and epidemiological information.  Fact Sheet for Patients:   [facility]  Fact Sheet for Healthcare Providers: 08/09/20  This test is not yet approved or  cleared by the [employer] FDA and has been authorized for detection and/or diagnosis of SARS-CoV-2 by FDA under an Emergency Use Authorization (EUA).  This EUA will remain in effect (meaning this test can be used) for the duration of the COVID-19 declaration under Section 564(b)(1) of the Act, 21 U.S.C. section 360bbb-3(b)(1), unless the authorization is terminated or revoked sooner.  Performed at [facility], [address], [place], [place] [address]   Surgical PCR screen     Status: Abnormal   Collection Time: 06/09/20 10:17 PM   Specimen: Nasal Mucosa; Nasal Swab  Result Value Ref Range Status   MRSA, PCR NEGATIVE NEGATIVE Final   Staphylococcus aureus POSITIVE (A) NEGATIVE Final    Comment: (NOTE) The Xpert SA Assay (FDA approved for NASAL specimens in patients 35 years of age and older), is one component of a comprehensive surveillance program. It is not intended to diagnose infection nor to guide or monitor treatment. Performed at [facility], [address], [place], [place] [address]          Radiology Studies: Pelvis Portable  Result Date: 06/10/2020 CLINICAL DATA:  Hip replacement EXAM: PORTABLE PELVIS 1-2 VIEWS COMPARISON:  06/09/2020 FINDINGS: The patient has undergone total hip arthroplasty on the right. The alignment appears near anatomic. There are expected postsurgical changes. There is no periprosthetic fracture. Mild degenerative  changes are noted of the left hip. IMPRESSION: Satisfactory postoperative appearance following right total hip arthroplasty. Electronically Signed   By: Katherine Mantle M.D.   On: 06/10/2020 18:30   DG C-Arm 1-60 Min-No Report  Result Date: 06/10/2020 Fluoroscopy was utilized by the requesting physician.  No radiographic interpretation.   DG HIP OPERATIVE UNILAT W OR W/O PELVIS RIGHT  Result Date: 06/10/2020 CLINICAL DATA:  Right hip replacement. EXAM: OPERATIVE RIGHT HIP (WITH PELVIS IF PERFORMED)  VIEWS TECHNIQUE: Fluoroscopic spot image(s) were submitted for interpretation post-operatively. COMPARISON:  None. FINDINGS: A radiopaque total right hip replacement is seen with both acetabular and femoral components present. There is no evidence of surrounding lucency to suggest the presence of hardware loosening. No acute fracture or dislocation is identified. IMPRESSION: Right hip replacement without evidence of hardware loosening or acute osseous abnormality. Electronically Signed   By: Aram Candela M.D.   On: 06/10/2020 18:24      Scheduled Meds: . aspirin EC  325 mg Oral Q breakfast  . calcium-vitamin D  1 tablet Oral BID  . docusate sodium  100 mg Oral BID  . mupirocin ointment  1 application Nasal BID  . pantoprazole  20 mg Oral Daily  . Vitamin D (Ergocalciferol)  50,000 Units Oral Q Tue   Continuous Infusions: . sodium chloride 75 mL/hr at 06/11/20 1051  . methocarbamol (ROBAXIN) IV       LOS: 2 days      Calvert Cantor, MD [facility] Pager: [email or web address] 06/11/2020, 5:32 PM

## 2020-06-11 NOTE — Plan of Care (Signed)
  Problem: Education: Goal: Knowledge of General Education information will improve Description Including pain rating scale, medication(s)/side effects and non-pharmacologic comfort measures Outcome: Progressing   

## 2020-06-11 NOTE — Progress Notes (Signed)
    Subjective:  Patient reports pain as mild to nonexistent.  Denies N/V/CP/SOB. Patient was resting comfortably in bed  Objective:   VITALS:   Vitals:   06/11/20 0947 06/11/20 0950 06/11/20 0954 06/11/20 0956  BP: (!) 105/56 98/60 (!) 87/53 (!) 86/40  Pulse: 83 90 89 78  Resp: 20 20 20 20   Temp:      TempSrc:      SpO2: 97% 97% 97%   Weight:      Height:        NAD ABD soft Neurovascular intact Sensation intact distally Intact pulses distally Dorsiflexion/Plantar flexion intact Incision: dressing C/D/I   Lab Results  Component Value Date   WBC 13.1 (H) 06/11/2020   HGB 10.0 (L) 06/11/2020   HCT 31.7 (L) 06/11/2020   MCV 104.6 (H) 06/11/2020   PLT 157 06/11/2020   BMET    Component Value Date/Time   NA 133 (L) 06/11/2020 0311   K 4.6 06/11/2020 0311   CL 103 06/11/2020 0311   CO2 23 06/11/2020 0311   GLUCOSE 181 (H) 06/11/2020 0311   BUN 28 (H) 06/11/2020 0311   CREATININE 1.12 06/11/2020 0311   CREATININE 0.85 10/09/2015 1457   CALCIUM 8.6 (L) 06/11/2020 0311   GFRNONAA 58 (L) 06/11/2020 0311   GFRNONAA 80 10/09/2015 1457   GFRAA >60 06/11/2020 0311   GFRAA >89 10/09/2015 1457     Assessment/Plan: 1 Day Post-Op   Active Problems:   Hip fracture (HCC)   Patient had vital signs positive for orthostatic changes when standing for PT. Treated by hospitalist service WBAT with walker DVT ppx: Aspirin, SCDs, TEDS PO pain control PT/OT Dispo: Discharge planning.  Home exercise plan   14/11/2014 06/11/2020, 12:28 PM   [facility] is now [facility] [address], [address], [place], [place] Phone: [phone] [email or web address] Facebook  [phone]

## 2020-06-12 LAB — CBC
HCT: 27 % — ABNORMAL LOW (ref 39.0–52.0)
Hemoglobin: 8.8 g/dL — ABNORMAL LOW (ref 13.0–17.0)
MCH: 33.8 pg (ref 26.0–34.0)
MCHC: 32.6 g/dL (ref 30.0–36.0)
MCV: 103.8 fL — ABNORMAL HIGH (ref 80.0–100.0)
Platelets: 121 10*3/uL — ABNORMAL LOW (ref 150–400)
RBC: 2.6 MIL/uL — ABNORMAL LOW (ref 4.22–5.81)
RDW: 12.3 % (ref 11.5–15.5)
WBC: 7.1 10*3/uL (ref 4.0–10.5)
nRBC: 0 % (ref 0.0–0.2)

## 2020-06-12 LAB — BASIC METABOLIC PANEL
Anion gap: 5 (ref 5–15)
BUN: 28 mg/dL — ABNORMAL HIGH (ref 8–23)
CO2: 23 mmol/L (ref 22–32)
Calcium: 7.9 mg/dL — ABNORMAL LOW (ref 8.9–10.3)
Chloride: 105 mmol/L (ref 98–111)
Creatinine, Ser: 0.88 mg/dL (ref 0.61–1.24)
GFR calc Af Amer: >60 mL/min (ref >60)
GFR calc non Af Amer: >60 mL/min (ref >60)
Glucose, Bld: 110 mg/dL — ABNORMAL HIGH (ref 70–99)
Potassium: 4.3 mmol/L (ref 3.5–5.1)
Sodium: 133 mmol/L — ABNORMAL LOW (ref 135–145)

## 2020-06-12 LAB — SURGICAL PATHOLOGY

## 2020-06-12 MED ORDER — VITAMIN D (ERGOCALCIFEROL) 1.25 MG (50000 UNIT) PO CAPS: 50000.0000 [IU] | ORAL_CAPSULE | ORAL | 0 refills | Status: DC

## 2020-06-12 MED ORDER — POLYETHYLENE GLYCOL 3350 17 G PO PACK: 17.0000 g | PACK | Freq: Every day | ORAL | 0 refills | Status: DC | PRN

## 2020-06-12 MED ORDER — ASPIRIN 81 MG PO CHEW
81.0000 mg | CHEWABLE_TABLET | Freq: Two times a day (BID) | ORAL | 0 refills | Status: AC
Start: 2020-06-12 — End: 2020-07-27

## 2020-06-12 MED ORDER — VITAMIN D (ERGOCALCIFEROL) 1.25 MG (50000 UNIT) PO CAPS: 50000.0000 [IU] | ORAL_CAPSULE | Freq: Once | ORAL | Status: DC

## 2020-06-12 MED ORDER — CALCIUM CARBONATE-VITAMIN D 500-200 MG-UNIT PO TABS: 1.0000 | ORAL_TABLET | Freq: Two times a day (BID) | ORAL | 0 refills | Status: DC

## 2020-06-12 MED ORDER — HYDROCODONE-ACETAMINOPHEN 5-325 MG PO TABS: 1.0000 | ORAL_TABLET | ORAL | 0 refills | Status: DC | PRN

## 2020-06-12 NOTE — Progress Notes (Signed)
Physical Therapy Treatment Patient Details Name: Bill Price MRN: [id number] DOB: [birth year] Today's Date: 06/12/2020    History of Present Illness Pt is an 86 year old male admitted Displaced Right femoral neck fracture and s/p Right THA direct anterior approach    PT Comments    Pt ambulated in hallway and practiced performing one step.  Pt fatigued thereafter so returned to room in recliner.     Follow Up Recommendations  Home health PT;Supervision/Assistance - 24 hour     Equipment Recommendations  None recommended by PT    Recommendations for Other Services       Precautions / Restrictions Precautions Precautions: Fall Precaution Comments: direct anterior approach Restrictions RLE Weight Bearing: Weight bearing as tolerated    Mobility  Bed Mobility Overal bed mobility: Needs Assistance Bed Mobility: Supine to Sit     Supine to sit: Min guard     General bed mobility comments: verbal cues for technique  Transfers Overall transfer level: Needs assistance Equipment used: Rolling walker (2 wheeled) Transfers: Sit to/from Stand Sit to Stand: Min guard         General transfer comment: verbal cues for hand placement, min/guard for safety, more unsteady today however reports feeling more soreness  Ambulation/Gait Ambulation/Gait assistance: Min guard Gait Distance (Feet): 120 Feet Assistive device: Rolling walker (2 wheeled) Gait Pattern/deviations: Step-through pattern;Decreased stride length;Decreased stance time - right     General Gait Details: verbal cues for sequence, RW positioning, posture;  slower pace today due to pain/sore   Stairs Stairs: Yes Stairs assistance: Min guard Stair Management: Step to pattern;Forwards;With walker Number of Stairs: 1 General stair comments: verbal cues for sequence, RW positioning, safety; performed twice   Wheelchair Mobility    Modified Rankin (Stroke Patients Only)       Balance                                             Cognition Arousal/Alertness: Awake/alert Behavior During Therapy: WFL for tasks assessed/performed Overall Cognitive Status: Within Functional Limits for tasks assessed                                        Exercises      General Comments        Pertinent Vitals/Pain Pain Assessment: 0-10 Pain Score: 6  Pain Location: right hip Pain Descriptors / Indicators: Sore;Grimacing;Aching;Guarding Pain Intervention(s): Repositioned;Monitored during session    Home Living                      Prior Function            PT Goals (current goals can now be found in the care plan section) Progress towards PT goals: Progressing toward goals    Frequency    7X/week      PT Plan Current plan remains appropriate    Co-evaluation              AM-PAC PT "6 Clicks" Mobility   Outcome Measure  Help needed turning from your back to your side while in a flat bed without using bedrails?: A Little Help needed moving from lying on your back to sitting on the side of a flat bed without using bedrails?: A Little Help needed  moving to and from a bed to a chair (including a wheelchair)?: A Little Help needed standing up from a chair using your arms (e.g., wheelchair or bedside chair)?: A Little Help needed to walk in hospital room?: A Little Help needed climbing 3-5 steps with a railing? : A Little 6 Click Score: 18    End of Session Equipment Utilized During Treatment: Gait belt Activity Tolerance: Patient tolerated treatment well Patient left: in chair;with call bell/phone within reach;with chair alarm set Nurse Communication: Mobility status PT Visit Diagnosis: Other abnormalities of gait and mobility (R26.89)     Time: 5465-0354 PT Time Calculation (min) (ACUTE ONLY): 14 min  Charges:  $Gait Training: 8-22 mins                     Paulino Door, DPT Acute Rehabilitation Services Pager:  [phone] Office: [phone]  Maida Sale E 06/12/2020, 1:41 PM

## 2020-06-12 NOTE — Progress Notes (Signed)
Physical Therapy Treatment Patient Details Name: Bill Price MRN: [id number] DOB: 20-Sep-1929 Today's Date: 06/12/2020    History of Present Illness Pt is an 86 year old male admitted Displaced Right femoral neck fracture and s/p Right THA direct anterior approach    PT Comments    Pt standing to urinate with chair alarming upon arrival so assisted pt for safety. Pt then ambulated in hallway and assisted back to bed.  Continue to recommend 24/7 assist at home for safety and HHPT.    Follow Up Recommendations  Home health PT;Supervision/Assistance - 24 hour     Equipment Recommendations  None recommended by PT    Recommendations for Other Services       Precautions / Restrictions Precautions Precautions: Fall Precaution Comments: direct anterior approach Restrictions Weight Bearing Restrictions: No RLE Weight Bearing: Weight bearing as tolerated    Mobility  Bed Mobility Overal bed mobility: Needs Assistance Bed Mobility: Sit to Supine     Supine to sit: Min guard Sit to supine: Min guard   General bed mobility comments: pt requesting assist for R LE however provided gait belt for pt to self assist  Transfers Overall transfer level: Needs assistance Equipment used: Rolling walker (2 wheeled) Transfers: Sit to/from Stand Sit to Stand: Min guard         General transfer comment: min/guard for safety, chair alarming upon arrival as pt standing to urinate  Ambulation/Gait Ambulation/Gait assistance: Min guard Gait Distance (Feet): 100 Feet Assistive device: Rolling walker (2 wheeled) Gait Pattern/deviations: Step-through pattern;Decreased stride length;Decreased stance time - right     General Gait Details: verbal cues for sequence, RW positioning, posture; slower pace today due to [employer] Mobility    Modified Rankin (Stroke Patients Only)       Balance                                             Cognition Arousal/Alertness: Awake/alert Behavior During Therapy: WFL for tasks assessed/performed Overall Cognitive Status: Within Functional Limits for tasks assessed                                        Exercises      General Comments        Pertinent Vitals/Pain Pain Assessment: 0-10 Pain Score: 5  Pain Location: right hip Pain Descriptors / Indicators: Sore;Grimacing;Aching;Guarding Pain Intervention(s): Monitored during session;Repositioned    Home Living                      Prior Function            PT Goals (current goals can now be found in the care plan section) Progress towards PT goals: Progressing toward goals    Frequency    7X/week      PT Plan Current plan remains appropriate    Co-evaluation              AM-PAC PT "6 Clicks" Mobility   Outcome Measure  Help needed turning from your back to your side while in a flat bed without using bedrails?: A Little Help needed moving from lying on your back to sitting on the side of a flat bed without using bedrails?: A Little Help needed  moving to and from a bed to a chair (including a wheelchair)?: A Little Help needed standing up from a chair using your arms (e.g., wheelchair or bedside chair)?: A Little Help needed to walk in hospital room?: A Little Help needed climbing 3-5 steps with a railing? : A Little 6 Click Score: 18    End of Session Equipment Utilized During Treatment: Gait belt Activity Tolerance: Patient tolerated treatment well Patient left: with call bell/phone within reach;in bed;with bed alarm set Nurse Communication: Mobility status PT Visit Diagnosis: Other abnormalities of gait and mobility (R26.89)     Time: 6834-1962 PT Time Calculation (min) (ACUTE ONLY): 15 min  Charges:  $Gait Training: 8-22 mins                    Paulino Door, DPT Acute Rehabilitation Services Pager: [phone] Office: [phone]  Maida Sale E 06/12/2020,  3:35 PM

## 2020-06-12 NOTE — TOC Transition Note (Signed)
Transition of Care [facility]) - CM/SW Discharge Note   Patient Details  Name: Bill Price MRN: [id number] Date of Birth: Dec 12, 1928  [facility] CM/SW Contact:  Clearance Coots, LCSW Phone Number: 06/12/2020, 1:37 PM   Clinical Narrative:    3 in 1 delivered to the pt. Bedside.    Final next level of care: Home w Home Health Services Barriers to Discharge: Barriers Resolved   Patient Goals and CMS Choice Patient states their goals for this hospitalization and ongoing recovery are:: Pain to stop CMS Medicare.gov Compare Post Acute Care list provided to:: Patient Choice offered to / list presented to : Patient, [facility] POA / Guardian  Discharge Placement                       Discharge Plan and Services In-house Referral: NA Discharge Planning Services: NA Post Acute Care Choice: Home Health          DME Arranged: 3-N-1 DME Agency: [employer] Date DME Agency Contacted: 06/11/20 Time DME Agency Contacted: 1300 Representative spoke with at DME Agency: Oletha Cruel HH Arranged: PT, OT   Date HH Agency Contacted: 06/11/20 Time HH Agency Contacted: 1048 Representative spoke with at [facility] Agency: Amy Hyatt  Social Determinants of Health (SDOH) Interventions     Readmission Risk Interventions No flowsheet data found.

## 2020-06-12 NOTE — Discharge Summary (Signed)
Physician Discharge Summary  Bill GistFredrick F Price ZOX:[id number] DOB: 06/15/1929 DOA: 06/09/2020  PCP: Pearline Cablesopland, Jessica C, MD  Admit date: 06/09/2020 Discharge date: 06/12/2020  Admitted From: home  Disposition:  home   Recommendations for Outpatient Follow-up:  1. F/u on orthostatic hypotension  Home Health:  ordered  Discharge Condition:  stable   CODE STATUS:  Full code   Diet recommendation:  Regular diet Consultations:  Orthopedic surgery  Procedures/Studies: - 8/3> right total hip arthroplasty   Discharge Diagnoses:  Principal Problem:   Closed displaced fracture of right femoral neck (HCC) Active Problems:   HYPOTENSION, ORTHOSTATIC   PVD (peripheral vascular disease) (HCC)     Brief Summary: Bill GistFredrick F Frasier 86 y.o.malewith history h/ohyperlipidemia, orthostatic hypotension, peripheral vascular disease, GERD, colon polyps, anxiety, osteopenia presents with complaints of worsening right hip pain.He has had this pain for a few weeks after he hit it while working in the garage. Initial work up showed no fracture but when pain got worse he presented to [facility] ED where he was found to have right femoral neck fracture.   Hospital Course:  Principal Problem:   Closed displaced fracture of right femoral neck (HCC) - 8/3> right total hip arthroplasty - plan is home with HH, WBAT with walker and aspirin daily  Active Problems:   HYPOTENSION, ORTHOSTATIC - 8/4>  poor urine output with SBP in 80s- given 500 cc NS and encouraged to drink fluids - add TEDS -cont NS at 75 cc/hr overnight - today his BP has improved and so has his urine output- stable for d/c    PVD (peripheral vascular disease) (HCC) - not on medications  Discharge Exam: Vitals:   06/11/20 2126 06/12/20 0453  BP: (!) 116/51 119/65  Pulse: 77 82  Resp: 16 18  Temp: 98.2 F (36.8 C) 98.7 F (37.1 C)  SpO2: 95% 93%   Vitals:   06/11/20 1228 06/11/20 1711 06/11/20 2126 06/12/20 0453  BP: 109/70 (!)  117/51 (!) 116/51 119/65  Pulse: 84 75 77 82  Resp: 18 18 16 18   Temp: 98.1 F (36.7 C) 97.9 F (36.6 C) 98.2 F (36.8 C) 98.7 F (37.1 C)  TempSrc: Oral Oral Oral Oral  SpO2: 98% 95% 95% 93%  Weight:      Height:        General: Pt is alert, awake, not in acute distress Cardiovascular: RRR, S1/S2 +, no rubs, no gallops Respiratory: CTA bilaterally, no wheezing, no rhonchi Abdominal: Soft, NT, ND, bowel sounds + Extremities: no edema, no cyanosis   Discharge Instructions  Discharge Instructions    Diet general   Complete by: As directed    Increase activity slowly   Complete by: As directed    No wound care   Complete by: As directed      Allergies as of 06/12/2020      Reactions   Augmentin [amoxicillin-pot Clavulanate] Diarrhea   Crestor [rosuvastatin]    Shaky, hot flashes      Medication List    TAKE these medications   acetaminophen 325 MG tablet Commonly known as: TYLENOL Take 650 mg by mouth every 6 (six) hours as needed for mild pain.   aspirin 81 MG chewable tablet Commonly known as: Aspirin Childrens Chew 1 tablet (81 mg total) by mouth 2 (two) times daily for 90 doses.   calcium-vitamin D 500-200 MG-UNIT tablet Commonly known as: OSCAL WITH D Take 1 tablet by mouth 2 (two) times daily.   HYDROcodone-acetaminophen 5-325 MG tablet Commonly  known as: NORCO/VICODIN Take 1 tablet by mouth every 4 (four) hours as needed for moderate pain.   polyethylene glycol 17 g packet Commonly known as: MIRALAX / GLYCOLAX Take 17 g by mouth daily as needed for mild constipation.   Vitamin D (Ergocalciferol) 1.25 MG (50000 UNIT) Caps capsule Commonly known as: DRISDOL Take 1 capsule (50,000 Units total) by mouth every Tuesday. Start taking on: June 17, 2020            Durable Medical Equipment  (From admission, onward)         Start     Ordered   06/11/20 1313  For home use only DME Bedside commode  Once       Question:  Patient needs a bedside  commode to treat with the following condition  Answer:  H/O joint surgery   06/11/20 1313          Follow-up Information    Swinteck, Arlys John, MD. Schedule an appointment as soon as possible for a visit in 2 weeks.   Specialty: Orthopedic Surgery Why: For wound re-check Contact information: [address] [place] [address] [phone]        [facility] Follow up.   Specialty: Home Health Services Why: This agency will provide Physical Therapy. The agency will call you prior to the first the first visit.  Contact information: [address] [place] [address] [phone]              Allergies  Allergen Reactions  . Augmentin [Amoxicillin-Pot Clavulanate] Diarrhea  . Crestor [Rosuvastatin]     Shaky, hot flashes      CT PELVIS WO CONTRAST  Result Date: 06/09/2020 CLINICAL DATA:  Pelvis pain. Right hip and tenderness since July 20th. No known injury. EXAM: CT PELVIS WITHOUT CONTRAST TECHNIQUE: Multidetector CT imaging of the pelvis was performed following the standard protocol without intravenous contrast. COMPARISON:  Right hip radiographs 05/27/2020 FINDINGS: Urinary Tract: The distal ureters and urinary bladder are within normal limits. Bowel:  Unremarkable visualized pelvic bowel loops. Vascular/Lymphatic: Atherosclerotic calcifications are present within the distal aorta and branch vessels without aneurysm or focal stenosis. Reproductive:  Prostate gland is within normal limits. Other: Fat herniates into the left inguinal canal without associated bowel. No other significant ventral hernia is present. No free fluid or free air is present. Musculoskeletal: A right subcapital femoral neck fracture is present. Fracture is displaced 1/2 shaft width with increased varus angulation. Acetabulum is intact. No acute pelvic fractures are present. Degenerative changes are present lumbar spine including grade 2 anterolisthesis at L5-S1 secondary to  bilateral pars defects. IMPRESSION: 1. Right subcapital femoral neck fracture with displaced 1/2 shaft width with increased varus angulation. 2. Fat herniates into the left inguinal canal without associated bowel. 3. Grade 2 anterolisthesis at L5-S1 secondary to bilateral pars defects. 4. Aortic Atherosclerosis (ICD10-I70.0). These results were called by telephone at the time of interpretation on 06/09/2020 at 2:26 pm to provider Dr Dalene Seltzer, who verbally acknowledged these results. Electronically Signed   By: Marin Roberts M.D.   On: 06/09/2020 14:26   Pelvis Portable  Result Date: 06/10/2020 CLINICAL DATA:  Hip replacement EXAM: PORTABLE PELVIS 1-2 VIEWS COMPARISON:  06/09/2020 FINDINGS: The patient has undergone total hip arthroplasty on the right. The alignment appears near anatomic. There are expected postsurgical changes. There is no periprosthetic fracture. Mild degenerative changes are noted of the left hip. IMPRESSION: Satisfactory postoperative appearance following right total hip arthroplasty. Electronically Signed  By: Katherine Mantle M.D.   On: 06/10/2020 18:30   DG Chest Portable 1 View  Result Date: 06/09/2020 CLINICAL DATA:  Hip fracture EXAM: PORTABLE CHEST 1 VIEW COMPARISON:  2016 FINDINGS: The heart size and mediastinal contours are within normal limits. Mild chronic interstitial prominence. No new consolidation or edema. Right costophrenic angle is partially excluded. No pleural effusion or pneumothorax. The visualized skeletal structures are unremarkable. IMPRESSION: No acute process in the chest. Electronically Signed   By: Guadlupe Spanish M.D.   On: 06/09/2020 14:14   DG Knee Right Port  Result Date: 06/09/2020 CLINICAL DATA:  86 year old male with fall and right lower extremity pain. EXAM: PORTABLE RIGHT KNEE - 1-2 VIEW; DG HIP (WITH OR WITHOUT PELVIS) 1V PORT RIGHT COMPARISON:  Right hip radiograph dated 05/27/2020. FINDINGS: There is a displaced fracture of the right  femoral neck with impaction and mild varus angulation. The bones are osteopenic. There is no dislocation. Mild degenerative changes of the hips and knee. No joint effusion. The soft tissues are unremarkable. IMPRESSION: Displaced fracture of the right femoral neck. Electronically Signed   By: Elgie Collard M.D.   On: 06/09/2020 16:17   DG C-Arm 1-60 Min-No Report  Result Date: 06/10/2020 Fluoroscopy was utilized by the requesting physician.  No radiographic interpretation.   DG HIP PORT UNILAT WITH PELVIS 1V RIGHT  Result Date: 06/09/2020 CLINICAL DATA:  86 year old male with fall and right lower extremity pain. EXAM: PORTABLE RIGHT KNEE - 1-2 VIEW; DG HIP (WITH OR WITHOUT PELVIS) 1V PORT RIGHT COMPARISON:  Right hip radiograph dated 05/27/2020. FINDINGS: There is a displaced fracture of the right femoral neck with impaction and mild varus angulation. The bones are osteopenic. There is no dislocation. Mild degenerative changes of the hips and knee. No joint effusion. The soft tissues are unremarkable. IMPRESSION: Displaced fracture of the right femoral neck. Electronically Signed   By: Elgie Collard M.D.   On: 06/09/2020 16:17   DG HIP OPERATIVE UNILAT W OR W/O PELVIS RIGHT  Result Date: 06/10/2020 CLINICAL DATA:  Right hip replacement. EXAM: OPERATIVE RIGHT HIP (WITH PELVIS IF PERFORMED)  VIEWS TECHNIQUE: Fluoroscopic spot image(s) were submitted for interpretation post-operatively. COMPARISON:  None. FINDINGS: A radiopaque total right hip replacement is seen with both acetabular and femoral components present. There is no evidence of surrounding lucency to suggest the presence of hardware loosening. No acute fracture or dislocation is identified. IMPRESSION: Right hip replacement without evidence of hardware loosening or acute osseous abnormality. Electronically Signed   By: Aram Candela M.D.   On: 06/10/2020 18:24   DG Hip Unilat W or Wo Pelvis 2-3 Views Right  Result Date:  05/27/2020 CLINICAL DATA:  Right hip and lower extremity pain. EXAM: DG HIP (WITH OR WITHOUT PELVIS) 2-3V RIGHT COMPARISON:  None. FINDINGS: No acute fracture or hip dislocation is identified. There is at most minimal right superior hip joint space narrowing and acetabular spurring with minimal acetabular spurring on the left as well. Atherosclerotic vascular calcifications are noted. IMPRESSION: No acute osseous abnormality or age advanced degenerative changes. Electronically Signed   By: Sebastian Ache M.D.   On: 05/27/2020 10:14     The results of significant diagnostics from this hospitalization (including imaging, microbiology, ancillary and laboratory) are listed below for reference.     Microbiology: Recent Results (from the past 240 hour(s))  SARS Coronavirus 2 by RT PCR (hospital order, performed in [facility] hospital lab) Nasopharyngeal Nasopharyngeal Swab     Status: None  Collection Time: 06/09/20  2:13 PM   Specimen: Nasopharyngeal Swab  Result Value Ref Range Status   SARS Coronavirus 2 NEGATIVE NEGATIVE Final    Comment: (NOTE) SARS-CoV-2 target nucleic acids are NOT DETECTED.  The SARS-CoV-2 RNA is generally detectable in upper and lower respiratory specimens during the acute phase of infection. The lowest concentration of SARS-CoV-2 viral copies this assay can detect is 250 copies / mL. A negative result does not preclude SARS-CoV-2 infection and should not be used as the sole basis for treatment or other patient management decisions.  A negative result may occur with improper specimen collection / handling, submission of specimen other than nasopharyngeal swab, presence of viral mutation(s) within the areas targeted by this assay, and inadequate number of viral copies (<250 copies / mL). A negative result must be combined with clinical observations, patient history, and epidemiological information.  Fact Sheet for Patients:    BoilerBrush.com.cy  Fact Sheet for Healthcare Providers: https://pope.com/  This test is not yet approved or  cleared by the [place] FDA and has been authorized for detection and/or diagnosis of SARS-CoV-2 by FDA under an Emergency Use Authorization (EUA).  This EUA will remain in effect (meaning this test can be used) for the duration of the COVID-19 declaration under Section 564(b)(1) of the Act, 21 U.S.C. section 360bbb-3(b)(1), unless the authorization is terminated or revoked sooner.  Performed at [facility], [address], [place], [place] [address]   Surgical PCR screen     Status: Abnormal   Collection Time: 06/09/20 10:17 PM   Specimen: Nasal Mucosa; Nasal Swab  Result Value Ref Range Status   MRSA, PCR NEGATIVE NEGATIVE Final   Staphylococcus aureus POSITIVE (A) NEGATIVE Final    Comment: (NOTE) The Xpert SA Assay (FDA approved for NASAL specimens in patients 70 years of age and older), is one component of a comprehensive surveillance program. It is not intended to diagnose infection nor to guide or monitor treatment. Performed at [facility], [address], [place], [place] [address]      Labs: BNP (last 3 results) No results for input(s): BNP in the last 8760 hours. Basic Metabolic Panel: Recent Labs  Lab 06/09/20 1413 06/10/20 0332 06/11/20 0311 06/12/20 0307  NA 136 135 133* 133*  K 3.9 4.5 4.6 4.3  CL 100 103 103 105  CO2 GLUCOSE 164* 169* 181* 110*  BUN 25* 24* 28* 28*  CREATININE 0.95 0.99 1.12 0.88  CALCIUM 9.3 9.1 8.6* 7.9*   Liver Function Tests: No results for input(s): AST, ALT, ALKPHOS, BILITOT, PROT, ALBUMIN in the last 168 hours. No results for input(s): LIPASE, AMYLASE in the last 168 hours. No results for input(s): AMMONIA in the last 168 hours. CBC: Recent Labs  Lab 06/09/20 1413 06/11/20 0311 06/12/20 0307  WBC 8.8 13.1* 7.1   NEUTROABS 7.0  --   --   HGB 12.8* 10.0* 8.8*  HCT 38.7* 31.7* 27.0*  MCV 99.5 104.6* 103.8*  PLT 196 157 121*   Cardiac Enzymes: No results for input(s): CKTOTAL, CKMB, CKMBINDEX, TROPONINI in the last 168 hours. BNP: Invalid input(s): POCBNP CBG: No results for input(s): GLUCAP in the last 168 hours. D-Dimer No results for input(s): DDIMER in the last 72 hours. Hgb A1c No results for input(s): HGBA1C in the last 72 hours. Lipid Profile No results for input(s): CHOL, HDL, LDLCALC, TRIG, CHOLHDL, LDLDIRECT in the last 72 hours. Thyroid function studies No  results for input(s): TSH, T4TOTAL, T3FREE, THYROIDAB in the last 72 hours.  Invalid input(s): FREET3 Anemia work up No results for input(s): VITAMINB12, FOLATE, FERRITIN, TIBC, IRON, RETICCTPCT in the last 72 hours. Urinalysis    Component Value Date/Time   BILIRUBINUR small (A) 10/09/2015 1503   BILIRUBINUR Small 10/24/2014 1004   KETONESUR negative 10/09/2015 1503   PROTEINUR negative 10/09/2015 1503   PROTEINUR Negative 10/24/2014 1004   UROBILINOGEN 0.2 10/09/2015 1503   NITRITE Negative 10/09/2015 1503   NITRITE Negative 10/24/2014 1004   LEUKOCYTESUR Negative 10/09/2015 1503   Sepsis Labs Invalid input(s): PROCALCITONIN,  WBC,  LACTICIDVEN Microbiology Recent Results (from the past 240 hour(s))  SARS Coronavirus 2 by RT PCR (hospital order, performed in [facility] hospital lab) Nasopharyngeal Nasopharyngeal Swab     Status: None   Collection Time: 06/09/20  2:13 PM   Specimen: Nasopharyngeal Swab  Result Value Ref Range Status   SARS Coronavirus 2 NEGATIVE NEGATIVE Final    Comment: (NOTE) SARS-CoV-2 target nucleic acids are NOT DETECTED.  The SARS-CoV-2 RNA is generally detectable in upper and lower respiratory specimens during the acute phase of infection. The lowest concentration of SARS-CoV-2 viral copies this assay can detect is 250 copies / mL. A negative result does not preclude SARS-CoV-2  infection and should not be used as the sole basis for treatment or other patient management decisions.  A negative result may occur with improper specimen collection / handling, submission of specimen other than nasopharyngeal swab, presence of viral mutation(s) within the areas targeted by this assay, and inadequate number of viral copies (<250 copies / mL). A negative result must be combined with clinical observations, patient history, and epidemiological information.  Fact Sheet for Patients:   BoilerBrush.com.cy  Fact Sheet for Healthcare Providers: https://pope.com/  This test is not yet approved or  cleared by the [place] FDA and has been authorized for detection and/or diagnosis of SARS-CoV-2 by FDA under an Emergency Use Authorization (EUA).  This EUA will remain in effect (meaning this test can be used) for the duration of the COVID-19 declaration under Section 564(b)(1) of the Act, 21 U.S.C. section 360bbb-3(b)(1), unless the authorization is terminated or revoked sooner.  Performed at [facility], [address], [place], [place] [address]   Surgical PCR screen     Status: Abnormal   Collection Time: 06/09/20 10:17 PM   Specimen: Nasal Mucosa; Nasal Swab  Result Value Ref Range Status   MRSA, PCR NEGATIVE NEGATIVE Final   Staphylococcus aureus POSITIVE (A) NEGATIVE Final    Comment: (NOTE) The Xpert SA Assay (FDA approved for NASAL specimens in patients 39 years of age and older), is one component of a comprehensive surveillance program. It is not intended to diagnose infection nor to guide or monitor treatment. Performed at [facility], [address], [place], [place] [address]      Time coordinating discharge in minutes: 65  SIGNED:   Calvert Cantor, MD  [facility] 06/12/2020, 2:18 PM

## 2020-06-12 NOTE — Plan of Care (Signed)
  Problem: Activity: Goal: Ability to ambulate and perform ADLs will improve Outcome: Adequate for Discharge   Problem: Pain Management: Goal: Pain level will decrease Outcome: Adequate for Discharge   Problem: Education: Goal: Knowledge of General Education information will improve Description: Including pain rating scale, medication(s)/side effects and non-pharmacologic comfort measures Outcome: Adequate for Discharge   Problem: Pain Managment: Goal: General experience of comfort will improve Outcome: Adequate for Discharge   Problem: Safety: Goal: Ability to remain free from injury will improve Outcome: Adequate for Discharge   Problem: Skin Integrity: Goal: Risk for impaired skin integrity will decrease Outcome: Adequate for Discharge   Problem: Activity: Goal: Risk for activity intolerance will decrease Outcome: Adequate for Discharge

## 2020-06-16 ENCOUNTER — Encounter: Payer: Self-pay | Admitting: Family Medicine

## 2020-06-16 ENCOUNTER — Telehealth: Payer: Self-pay | Admitting: Family Medicine

## 2020-06-16 NOTE — Telephone Encounter (Signed)
Caller: Will ([facility]) Call back # [phone]  Lorain Childes  Will is calling to let you know patient refused OT at this time.

## 2020-06-16 NOTE — Telephone Encounter (Signed)
fyi

## 2020-09-09 NOTE — Progress Notes (Addendum)
[place] at [facility] [address], [place], [place] [address] [phone]  Date:  09/10/2020   Name:  Bill Price   DOB:  08/29/1929   MRN:  [id number]  PCP:  Darreld Mclean, MD    Chief Complaint: Follow-up (Hip replacement in August) and Flu Vaccine (has had flu shot-unknown date)   History of Present Illness:  Bill Price is a 86 y.o. 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 He lost his wife of many years, Guerry Minors, not long ago.They were married for 53 years. 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  He is in contact with his stepchildren, also has 2 surviving siblings live Patient Active Problem List   Diagnosis Date Noted  . Closed displaced fracture of right femoral neck ([place]) 06/11/2020  . PVD (peripheral vascular disease) ([place]) 06/11/2020  . 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 ACTINIC KERATOSIS, HEAD 10/05/2010  . ANXIETY 06/04/2010  . HYPOTENSION, ORTHOSTATIC 06/04/2010  . IRRITABLE BOWEL SYNDROME 04/15/2010  . PERIPHERAL VASCULAR DISEASE 09/10/2009  . ALLERGIC RHINITIS 09/10/2009  . GERD 04/22/2009   . ARTHRITIS, HIP 05/15/2008  . OSTEOPENIA 01/17/2008  . HYPERLIPIDEMIA 09/21/2007  . RENAL CALCULUS, HX OF 09/21/2007    Past Medical History:  Diagnosis Date  . Allergic rhinitis   . Anxiety   . Arthritis   . Colon polyp   . GERD (gastroesophageal reflux disease)   . Hemorrhoids   . Hyperlipidemia   . IBS (irritable bowel syndrome)   . Impotence   . Kidney stone   . Orthostatic hypotension   . Osteopenia   . PVD (peripheral vascular disease) ([place])   . Urinary obstruction     Past Surgical History:  Procedure Laterality Date  . COLONOSCOPY  2007  . HERNIA REPAIR  2005  . TOTAL HIP ARTHROPLASTY Right 06/10/2020   Procedure: TOTAL HIP ARTHROPLASTY ANTERIOR APPROACH;  Surgeon: Rod Can, MD;  Location: [facility];  Service: Orthopedics;  Laterality: Right;    Social History   Tobacco Use  . Smoking status: Never Smoker  . Smokeless tobacco: Never Used  Vaping Use  . Vaping Use: Never used  Substance Use Topics  . Alcohol use: Yes    Alcohol/week: 0.0 standard drinks    Comment: 1 glass of wine a day  . Drug use: No    Family History  Problem Relation Age of Onset  . Prostate cancer Brother   . Colon cancer Neg Hx   . Colon polyps Neg Hx   . Diabetes Neg Hx   . Kidney disease Neg Hx   . Esophageal cancer  Neg Hx   . Gallbladder disease Neg Hx   . Heart disease Neg Hx     Allergies  Allergen Reactions  . Augmentin [Amoxicillin-Pot Clavulanate] Diarrhea  . Crestor [Rosuvastatin]     Shaky, hot flashes    Medication list has been reviewed and updated.  No current outpatient medications on file prior to visit.   No current facility-administered medications on file prior to visit.    Review of Systems:  As per HPI- otherwise negative.   Physical Examination: Vitals:   09/10/20 1328  BP: 128/62  Pulse: 72  Resp: 18  SpO2: 97%   Vitals:   09/10/20 1328  Weight: 177 lb (80.3 kg)  Height: '5\' 8"'  (1.727 m)   Body mass index is 26.91  kg/m. Ideal Body Weight: Weight in (lb) to have BMI = 25: 164.1  GEN: no acute distress.  Elderly man Normal weight , looks well HEENT: Atraumatic, Normocephalic.  Ears and Nose: No external deformity. CV: RRR, No M/G/R. No JVD. No thrill. No extra heart sounds. PULM: CTA B, no wheezes, crackles, rhonchi. No retractions. No resp. distress. No accessory muscle use. ABD: S, NT, ND, +BS. No rebound. No HSM. EXTR: No c/c/e PSYCH: Normally interactive. Conversant.  Good range of motion of right hip Walks well, brisk pace   Assessment and Plan: Acute blood loss anemia - Plan: CBC  Medication monitoring encounter - Plan: Comprehensive metabolic panel  Closed fracture of hip with routine healing, unspecified laterality, subsequent encounter  Elderly gentleman here today for a follow-up visit.  He had a hip fracture over the summer, has recovered remarkably well.  Encouraged him to have a COVID-19 booster.  Flu shot is done for the year, though he does not recall the exact date  Follow-up on CBC and CMP as above- Will plan further follow- up pending labs. Plan to visit in 6 months This visit occurred during the SARS-CoV-2 public health emergency.  Safety protocols were in place, including screening questions prior to the visit, additional usage of staff PPE, and extensive cleaning of exam room while observing appropriate contact time as indicated for disinfecting solutions.    Signed Lamar Blinks, MD  Received his labs as followed 11/4.  Letter to patient  Results for orders placed or performed in visit on 09/10/20  Comprehensive metabolic panel  Result Value Ref Range   Glucose, Bld 114 (H) 65 - 99 mg/dL   BUN 17 7 - 25 mg/dL   Creat 0.99 0.70 - 1.11 mg/dL   BUN/Creatinine Ratio NOT APPLICABLE 6 - 22 (calc)   Sodium 138 135 - 146 mmol/L   Potassium 4.3 3.5 - 5.3 mmol/L   Chloride 103 98 - 110 mmol/L   CO2 28 20 - 32 mmol/L   Calcium 9.3 8.6 - 10.3 mg/dL   Total Protein 6.4  6.1 - 8.1 g/dL   Albumin 3.7 3.6 - 5.1 g/dL   Globulin 2.7 1.9 - 3.7 g/dL (calc)   AG Ratio 1.4 1.0 - 2.5 (calc)   Total Bilirubin 0.4 0.2 - 1.2 mg/dL   Alkaline phosphatase (APISO) 75 35 - 144 U/L   AST 17 10 - 35 U/L   ALT 9 9 - 46 U/L  CBC  Result Value Ref Range   WBC 5.8 3.8 - 10.8 Thousand/uL   RBC 3.86 (L) 4.20 - 5.80 Million/uL   Hemoglobin 12.3 (L) 13.2 - 17.1 g/dL   HCT 37.9 (L) 38 - 50 %   MCV 98.2 80.0 -  100.0 fL   MCH 31.9 27.0 - 33.0 pg   MCHC 32.5 32.0 - 36.0 g/dL   RDW 11.7 11.0 - 15.0 %   Platelets 175 140 - 400 Thousand/uL   MPV 12.5 7.5 - 12.5 fL

## 2020-09-09 NOTE — Patient Instructions (Addendum)
It was good to see you again today, I will be in touch with your labs as soon as possible You are doing great after your hip surgery!    Please see me in about 6 months I would suggest that you get a Covid 19 booster

## 2020-09-10 ENCOUNTER — Other Ambulatory Visit: Payer: Self-pay

## 2020-09-10 ENCOUNTER — Ambulatory Visit (INDEPENDENT_AMBULATORY_CARE_PROVIDER_SITE_OTHER): Payer: Medicare Other | Admitting: Family Medicine

## 2020-09-10 ENCOUNTER — Encounter: Payer: Self-pay | Admitting: Family Medicine

## 2020-09-10 VITALS — BP 128/62 | HR 72 | Resp 18 | Ht 68.0 in | Wt 177.0 lb

## 2020-09-10 DIAGNOSIS — Z5181 Encounter for therapeutic drug level monitoring: Secondary | ICD-10-CM | POA: Diagnosis not present

## 2020-09-10 DIAGNOSIS — S72009D Fracture of unspecified part of neck of unspecified femur, subsequent encounter for closed fracture with routine healing: Secondary | ICD-10-CM | POA: Diagnosis not present

## 2020-09-10 DIAGNOSIS — D62 Acute posthemorrhagic anemia: Secondary | ICD-10-CM

## 2020-09-11 LAB — COMPREHENSIVE METABOLIC PANEL
AG Ratio: 1.4 (calc) (ref 1.0–2.5)
ALT: 9 U/L (ref 9–46)
AST: 17 U/L (ref 10–35)
Albumin: 3.7 g/dL (ref 3.6–5.1)
Alkaline phosphatase (APISO): 75 U/L (ref 35–144)
BUN: 17 mg/dL (ref 7–25)
CO2: 28 mmol/L (ref 20–32)
Calcium: 9.3 mg/dL (ref 8.6–10.3)
Chloride: 103 mmol/L (ref 98–110)
Creat: 0.99 mg/dL (ref 0.70–1.11)
Globulin: 2.7 g/dL (calc) (ref 1.9–3.7)
Glucose, Bld: 114 mg/dL — ABNORMAL HIGH (ref 65–99)
Potassium: 4.3 mmol/L (ref 3.5–5.3)
Sodium: 138 mmol/L (ref 135–146)
Total Bilirubin: 0.4 mg/dL (ref 0.2–1.2)
Total Protein: 6.4 g/dL (ref 6.1–8.1)

## 2020-09-11 LAB — CBC
HCT: 37.9 % — ABNORMAL LOW (ref 38.5–50.0)
Hemoglobin: 12.3 g/dL — ABNORMAL LOW (ref 13.2–17.1)
MCH: 31.9 pg (ref 27.0–33.0)
MCHC: 32.5 g/dL (ref 32.0–36.0)
MCV: 98.2 fL (ref 80.0–100.0)
MPV: 12.5 fL (ref 7.5–12.5)
Platelets: 175 10*3/uL (ref 140–400)
RBC: 3.86 10*6/uL — ABNORMAL LOW (ref 4.20–5.80)
RDW: 11.7 % (ref 11.0–15.0)
WBC: 5.8 10*3/uL (ref 3.8–10.8)

## 2020-09-29 ENCOUNTER — Telehealth: Payer: Self-pay | Admitting: Family Medicine

## 2020-09-29 NOTE — Telephone Encounter (Signed)
Bill Price,[facility]  Fax Number : [phone]  Patient is getting dental work and the office needs of medication patient is currently on , please list to number above.

## 2020-09-29 NOTE — Telephone Encounter (Signed)
Can you very-no medications on current list. Ok to let them know he is not  on any current medications?

## 2020-09-30 NOTE — Telephone Encounter (Signed)
Letter has been faxed.

## 2020-10-07 ENCOUNTER — Telehealth: Payer: Self-pay | Admitting: Family Medicine

## 2020-10-07 NOTE — Telephone Encounter (Signed)
Aram Beecham from [facility] states on the letter dated  11/24 it is unclear if patient needs premeds before procedure. I was able to find letter from  11/22 but not 11/24 that she was speaking of. Please call back to confirm patient will needs meds before his surgery

## 2020-10-08 NOTE — Telephone Encounter (Signed)
Spoke with Cynthia-discussed patient is not on any medications. Discussed allergies to Augmentin and Crestor. She verbalized understanding. Will call if she has any further questions. Advised her to ask for me.

## 2020-12-31 ENCOUNTER — Other Ambulatory Visit: Payer: Self-pay

## 2020-12-31 ENCOUNTER — Ambulatory Visit (HOSPITAL_BASED_OUTPATIENT_CLINIC_OR_DEPARTMENT_OTHER)
Admission: RE | Admit: 2020-12-31 | Discharge: 2020-12-31 | Disposition: A | Discharge status: Home / Self Care | Payer: Medicare Other | Source: Ambulatory Visit | Attending: Family Medicine | Admitting: Family Medicine

## 2020-12-31 ENCOUNTER — Encounter: Payer: Self-pay | Admitting: Family Medicine

## 2020-12-31 ENCOUNTER — Ambulatory Visit (INDEPENDENT_AMBULATORY_CARE_PROVIDER_SITE_OTHER): Payer: Medicare Other | Admitting: Family Medicine

## 2020-12-31 VITALS — BP 124/82 | HR 84 | Resp 19 | Ht 68.0 in | Wt 178.0 lb

## 2020-12-31 DIAGNOSIS — M25552 Pain in left hip: Secondary | ICD-10-CM | POA: Insufficient documentation

## 2020-12-31 DIAGNOSIS — D62 Acute posthemorrhagic anemia: Secondary | ICD-10-CM

## 2020-12-31 NOTE — Progress Notes (Addendum)
[facility] at [facility] [address], [address] [place], [place] [address] [phone]  Date:  12/31/2020   Name:  Bill Price   DOB:  12/06/1928   MRN:  [id number]  PCP:  Pearline Cables, MD    Chief Complaint: Hip Pain (Left hip pain, no known injury, started couple of weeks ago/)   History of Present Illness:  Bill Price is a 87 y.o. 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 He lost his wife of many years, Bill Price, not long ago.They were married for 53 years. History of hyperlipidemia, IBS, PVD, GERD  Last seen by myself in November of last year  He did have a RIGHT hip replacement in August following a fall and hip fracture in July  In November we checked his blood counts to see how he was recovering from his hp surgery   He notes that he may have some left hip pain for the last couple of weeks No fall or other injury that he can recall It starts out ok in the am and gets worse as the day goes on   Patient Active Problem List   Diagnosis Date Noted  . Closed displaced fracture of right femoral neck ([facility]) 06/11/2020  . PVD (peripheral vascular disease) ([facility]) 06/11/2020  . 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 ACTINIC KERATOSIS, HEAD 10/05/2010  . ANXIETY 06/04/2010  . HYPOTENSION, ORTHOSTATIC 06/04/2010  . IRRITABLE BOWEL SYNDROME 04/15/2010  . PERIPHERAL VASCULAR DISEASE 09/10/2009  . ALLERGIC RHINITIS 09/10/2009  . GERD 04/22/2009  . ARTHRITIS, HIP 05/15/2008  . OSTEOPENIA 01/17/2008  . HYPERLIPIDEMIA 09/21/2007  . RENAL CALCULUS, HX OF 09/21/2007    Past Medical History:  Diagnosis Date  . Allergic rhinitis   . Anxiety   . Arthritis   . Colon polyp   . GERD (gastroesophageal reflux disease)   . Hemorrhoids   . Hyperlipidemia   . IBS (irritable bowel syndrome)   .  Impotence   . Kidney stone   . Orthostatic hypotension   . Osteopenia   . PVD (peripheral vascular disease) ([facility])   . Urinary obstruction     Past Surgical History:  Procedure Laterality Date  . COLONOSCOPY  2007  . HERNIA REPAIR  2005  . TOTAL HIP ARTHROPLASTY Right 06/10/2020   Procedure: TOTAL HIP ARTHROPLASTY ANTERIOR APPROACH;  Surgeon: Samson Frederic, MD;  Location: [facility];  Service: Orthopedics;  Laterality: Right;    Social History   Tobacco Use  . Smoking status: Never Smoker  . Smokeless tobacco: Never Used  Vaping Use  . Vaping Use: Never used  Substance Use Topics  . Alcohol use: Yes    Alcohol/week: 0.0 standard drinks    Comment: 1 glass of wine a day  . Drug use: No    Family History  Problem Relation Age of Onset  . Prostate cancer Brother   . Colon cancer Neg Hx   . Colon polyps Neg Hx   . Diabetes Neg Hx   . Kidney disease Neg Hx   . Esophageal cancer Neg Hx   . Gallbladder disease Neg Hx   . Heart disease Neg Hx     Allergies  Allergen Reactions  . Augmentin [Amoxicillin-Pot Clavulanate] Diarrhea  . Crestor [Rosuvastatin]     Shaky, hot flashes  Medication list has been reviewed and updated.  No current outpatient medications on file prior to visit.   No current facility-administered medications on file prior to visit.    Review of Systems:  As per HPI- otherwise negative.   Physical Examination: Vitals:   12/31/20 1421  BP: 124/82  Pulse: 84  Resp: 19  SpO2: 97%   Vitals:   12/31/20 1421  Weight: 178 lb (80.7 kg)  Height: 5\' 8"  (1.727 m)   Body mass index is 27.06 kg/m. Ideal Body Weight: Weight in (lb) to have BMI = 25: 164.1  GEN: no acute distress.  Normal weight for age, looks well HEENT: Atraumatic, Normocephalic.  Ears and Nose: No external deformity. CV: RRR, No M/G/R. No JVD. No thrill. No extra heart sounds. PULM: CTA B, no wheezes, crackles, rhonchi. No retractions. No resp. distress. No accessory muscle  use. ABD: S, NT, ND] EXTR: No c/c/e PSYCH: Normally interactive. Conversant.  He is able to walk without pain.  No pain with internal/external rotation of the hip, or with flexion of the hip.  His pain actually seems to be in the posterior "hip" over the left SI joint and glute No bony tenderness of the lumbar spine   Assessment and Plan: Left hip pain - Plan: DG Hip Unilat W OR W/O Pelvis 2-3 Views Left  Acute blood loss anemia - Plan: CBC Patient today with concern of left hip pain.  This seems to be less likely hip joint pain and more likely in the SI joint, will obtain plain x-rays today and be in touch with him ASAP  Follow-up on CBC today, this is been trending back to normal since his hip replacement  Signed , MD  addnd 2/24- received his labs as below, letter to pt I also called to check on his hip films with reading room   Results for orders placed or performed in visit on 12/31/20  CBC  Result Value Ref Range   WBC 5.9 4.0 - 10.5 K/uL   RBC 3.72 (L) 4.22 - 5.81 Mil/uL   Platelets 153.0 150.0 - 400.0 K/uL   Hemoglobin 12.5 (L) 13.0 - 17.0 g/dL   HCT 01/02/21 (L) 29.9 - 24.2 %   MCV 98.0 78.0 - 100.0 fl   MCHC 34.2 30.0 - 36.0 g/dL   RDW 68.3 41.9 - 62.2 %

## 2020-12-31 NOTE — Patient Instructions (Signed)
Good to see you again today- please stop by the lab and then the ground floor x-ray department to have an x-ray of your left hip I will be in touch with your reports asap  Take care!

## 2021-01-01 LAB — CBC
HCT: 36.5 % — ABNORMAL LOW (ref 39.0–52.0)
Hemoglobin: 12.5 g/dL — ABNORMAL LOW (ref 13.0–17.0)
MCHC: 34.2 g/dL (ref 30.0–36.0)
MCV: 98 fl (ref 78.0–100.0)
Platelets: 153 10*3/uL (ref 150.0–400.0)
RBC: 3.72 Mil/uL — ABNORMAL LOW (ref 4.22–5.81)
RDW: 14 % (ref 11.5–15.5)
WBC: 5.9 10*3/uL (ref 4.0–10.5)

## 2021-04-22 NOTE — Progress Notes (Signed)
Subjective:   Bill Price is a 87 y.o. male who presents for Medicare Annual/Subsequent preventive examination.  Review of Systems     Cardiac Risk Factors include: male gender;advanced age (>2655men, 61>65 women);dyslipidemia     Objective:    Today's Vitals   04/23/21 0801 04/23/21 0805  BP: 138/80   Pulse: 71   Resp: 16   Temp: 97.9 F (36.6 C)   TempSrc: Temporal   SpO2: 97%   Weight: 182 lb 3.2 oz (82.6 kg)   Height: 5\' 8"  (1.727 m)   PainSc:  2    Body mass index is 27.7 kg/m.  Advanced Directives 04/23/2021 06/09/2020 06/09/2020 05/27/2020 04/17/2020 04/03/2019 03/30/2018  Does Patient Have a Medical Advance Directive? No Yes No No Yes No No  Type of Advance Directive - MidwifeHealthcare Power of Attorney - - Healthcare Power of SaxisAttorney;Living will - -  Does patient want to make changes to medical advance directive? - No - Patient declined - - - - -  Copy of Healthcare Power of Attorney in Chart? - - - - No - copy requested - -  Would patient like information on creating a medical advance directive? Yes (MAU/Ambulatory/Procedural Areas - Information given) - - - - No - Patient declined No - Patient declined    Current Medications (verified) No outpatient encounter medications on file as of 04/23/2021.   No facility-administered encounter medications on file as of 04/23/2021.    Allergies (verified) Augmentin [amoxicillin-pot clavulanate] and Crestor [rosuvastatin]   History: Past Medical History:  Diagnosis Date   Allergic rhinitis    Anxiety    Arthritis    Colon polyp    GERD (gastroesophageal reflux disease)    Hemorrhoids    Hyperlipidemia    IBS (irritable bowel syndrome)    Impotence    Kidney stone    Orthostatic hypotension    Osteopenia    PVD (peripheral vascular disease) ([facility])    Urinary obstruction    Past Surgical History:  Procedure Laterality Date   COLONOSCOPY  2007   HERNIA REPAIR  2005   TOTAL HIP ARTHROPLASTY Right 06/10/2020   Procedure:  TOTAL HIP ARTHROPLASTY ANTERIOR APPROACH;  Surgeon: Samson FredericSwinteck, Brian, MD;  Location: [facility];  Service: Orthopedics;  Laterality: Right;   Family History  Problem Relation Age of Onset   Prostate cancer Brother    Colon cancer Neg Hx    Colon polyps Neg Hx    Diabetes Neg Hx    Kidney disease Neg Hx    Esophageal cancer Neg Hx    Gallbladder disease Neg Hx    Heart disease Neg Hx    Social History   Socioeconomic History   Marital status: Widowed    Spouse name: Not on file   Number of children: 0   Years of education: Not on file   Highest education level: Not on file  Occupational History   Occupation: Retired  Tobacco Use   Smoking status: Never   Smokeless tobacco: Never  Vaping Use   Vaping Use: Never used  Substance and Sexual Activity   Alcohol use: Yes    Alcohol/week: 0.0 standard drinks    Comment: 1 glass of wine a day   Drug use: No   Sexual activity: Never  Other Topics Concern   Not on file  Social History Narrative   Not on file   Social Determinants of Health   Financial Resource Strain: Low Risk    Difficulty of Paying  Living Expenses: Not hard at all  Food Insecurity: No Food Insecurity   Worried About Running Out of Food in the Last Year: Never true   Ran Out of Food in the Last Year: Never true  Transportation Needs: No Transportation Needs   Lack of Transportation (Medical): No   Lack of Transportation (Non-Medical): No  Physical Activity: Inactive   Days of Exercise per Week: 0 days   Minutes of Exercise per Session: 0 min  Stress: No Stress Concern Present   Feeling of Stress : Not at all  Social Connections: Moderately Isolated   Frequency of Communication with Friends and Family: More than three times a week   Frequency of Social Gatherings with Friends and Family: More than three times a week   Attends Religious Services: Never   Database administrator or Organizations: No   Attends Banker Meetings: 1 to 4 times per year    Marital Status: Widowed    Tobacco Counseling Counseling given: Not Answered   Clinical Intake:  Pre-visit preparation completed: Yes  Pain : 0-10 Pain Score: 2  Pain Type: Chronic pain Pain Location: Leg Pain Onset: More than a month ago Pain Frequency: Constant     Nutritional Status: BMI 25 -29 Overweight Nutritional Risks: None Diabetes: No  How often do you need to have someone help you when you read instructions, pamphlets, or other written materials from your doctor or pharmacy?: 1 - Never  Diabetic?No  Interpreter Needed?: No  Information entered by :: [employer]   Activities of Daily Living In your present state of health, do you have any difficulty performing the following activities: 04/23/2021 06/09/2020  Hearing? N -  Comment - -  Vision? N -  Comment - -  Difficulty concentrating or making decisions? N -  Walking or climbing stairs? N -  Dressing or bathing? N -  Doing errands, shopping? N Y  Quarry manager and eating ? N -  Using the Toilet? N -  In the past six months, have you accidently leaked urine? N -  Do you have problems with loss of bowel control? N -  Managing your Medications? N -  Managing your Finances? N -  Housekeeping or managing your Housekeeping? N -  Some recent data might be hidden    Patient Care Team: Copland, Gwenlyn Found, MD as PCP - General (Family Medicine)  Indicate any recent Medical Services you may have received from other than [facility] providers in the past year (date may be approximate).     Assessment:   This is a routine wellness examination for [place].  Hearing/Vision screen Hearing Screening - Comments:: Bilateral hearing aids Vision Screening - Comments:: Wears glasses Last eye exam-2020-Dr. Groat  Dietary issues and exercise activities discussed: Current Exercise Habits: The patient does not participate in regular exercise at present (not exercising at present due to leg pain), Exercise limited  by: orthopedic condition(s) (leg pain)   Goals Addressed             This Visit's Progress    Maintain independence   On track      Depression Screen PHQ 2/9 Scores 04/23/2021 04/17/2020 04/03/2019 03/30/2018 03/29/2017 10/09/2015 07/03/2014  PHQ - 2 Score 0 0 0 0 0 0 0    Fall Risk Fall Risk  04/23/2021 04/17/2020 04/03/2019 03/30/2018 03/29/2017  Falls in the past year? 0 1 0 No No  Number falls in past yr: 0 1 - - -  Injury with Fall? 0 0 - - -  Risk for fall due to : - History of fall(s) - - -  Follow up Falls prevention discussed Falls evaluation completed;Falls prevention discussed - - -    FALL RISK PREVENTION PERTAINING TO THE HOME:  Any stairs in or around the home? Yes  If so, are there any without handrails? No  Home free of loose throw rugs in walkways, pet beds, electrical cords, etc? Yes  Adequate lighting in your home to reduce risk of falls? Yes   ASSISTIVE DEVICES UTILIZED TO PREVENT FALLS:  Life alert? No  Use of a cane, walker or w/c? No  Grab bars in the bathroom? Yes  Shower chair or bench in shower? No  Elevated toilet seat or a handicapped toilet? No   TIMED UP AND GO:  Was the test performed? Yes .  Length of time to ambulate 10 feet: 11 sec.   Gait slow and steady without use of assistive device  Cognitive Function:Normal cognitive status assessed by direct observation by this Nurse Health Advisor. No abnormalities found.   MMSE - Mini Mental State Exam 03/29/2017  Orientation to time 5  Orientation to Place 5  Registration 3  Attention/ Calculation 4  Recall 2  Language- name 2 objects 2  Language- repeat 1  Language- follow 3 step command 3  Language- read & follow direction 1  Write a sentence 1  Copy design 0  Total score 27     6CIT Screen 04/17/2020  What Year? 0 points  What month? 0 points  What time? 0 points  Count back from 20 0 points  Months in reverse 0 points  Repeat phrase 0 points  Total Score 0     Immunizations Immunization History  Administered Date(s) Administered   Influenza Split 08/26/2011, 08/08/2012   Influenza Whole 09/21/2007, 08/20/2009, 08/04/2010   Influenza, High Dose Seasonal PF 08/16/2016, 08/24/2017, 10/13/2018   Influenza,inj,Quad PF,6+ Mos 08/16/2013, 09/19/2014, 08/07/2015   Influenza-Unspecified 07/09/2020   Moderna Sars-Covid-2 Vaccination 01/23/2020, 02/20/2020, 09/18/2020   Pneumococcal Conjugate-13 10/24/2014   Pneumococcal Polysaccharide-23 08/31/2017   Tdap 03/09/2008    TDAP status: Up to date  Flu Vaccine status: Up to date  Pneumococcal vaccine status: Up to date  Covid-19 vaccine status: Completed vaccines  Qualifies for Shingles Vaccine? Yes   Zostavax completed No   Shingrix Completed?: Yes  Screening Tests Health Maintenance  Topic Date Due   Zoster Vaccines- Shingrix (1 of 2) Never done   COVID-19 Vaccine (4 - Booster for Moderna series) 12/19/2020   INFLUENZA VACCINE  06/08/2021   TETANUS/TDAP  06/26/2024   PNA vac Low Risk Adult  Completed   HPV VACCINES  Aged Out    Health Maintenance  Health Maintenance Due  Topic Date Due   Zoster Vaccines- Shingrix (1 of 2) Never done   COVID-19 Vaccine (4 - Booster for Moderna series) 12/19/2020    Colorectal cancer screening: No longer required.   Lung Cancer Screening: (Low Dose CT Chest recommended if Age 70-80 years, 30 pack-year currently smoking OR have quit w/in 15years.) does not qualify.     Additional Screening:  Hepatitis C Screening: does not qualify  Vision Screening: Recommended annual ophthalmology exams for early detection of glaucoma and other disorders of the eye. Is the patient up to date with their annual eye exam?  No  Who is the provider or what is the name of the office in which the patient attends annual eye exams?  Dr. Dione Booze Patient states he will make an appt soon  Dental Screening: Recommended annual dental exams for proper oral  hygiene  Community Resource Referral / Chronic Care Management: CRR required this visit?  No   CCM required this visit?  No      Plan:     I have personally reviewed and noted the following in the patient's chart:   Medical and social history Use of alcohol, tobacco or illicit drugs  Current medications and supplements including opioid prescriptions. Patient is not currently taking opioid prescriptions. Functional ability and status Nutritional status Physical activity Advanced directives List of other physicians Hospitalizations, surgeries, and ER visits in previous 12 months Vitals Screenings to include cognitive, depression, and falls Referrals and appointments  In addition, I have reviewed and discussed with patient certain preventive protocols, quality metrics, and best practice recommendations. A written personalized care plan for preventive services as well as general preventive health recommendations were provided to patient.     Roanna Raider, LPN   07/01/2352  Nurse Health Advisor  Nurse Notes: None

## 2021-04-23 ENCOUNTER — Ambulatory Visit (INDEPENDENT_AMBULATORY_CARE_PROVIDER_SITE_OTHER): Payer: Medicare Other

## 2021-04-23 ENCOUNTER — Other Ambulatory Visit: Payer: Self-pay

## 2021-04-23 ENCOUNTER — Ambulatory Visit: Payer: Medicare Other | Admitting: *Deleted

## 2021-04-23 VITALS — BP 138/80 | HR 71 | Temp 97.9°F | Resp 16 | Ht 68.0 in | Wt 182.2 lb

## 2021-04-23 DIAGNOSIS — Z0000 Encounter for general adult medical examination without abnormal findings: Secondary | ICD-10-CM

## 2021-04-23 NOTE — Patient Instructions (Signed)
Bill Price , Thank you for taking time to come for your Medicare Wellness Visit. I appreciate your ongoing commitment to your health goals. Please review the following plan we discussed and let me know if I can assist you in the future.   Screening recommendations/referrals: Colonoscopy: No linger required Recommended yearly ophthalmology/optometry visit for glaucoma screening and checkup Recommended yearly dental visit for hygiene and checkup  Vaccinations: Influenza vaccine: Up to date Pneumococcal vaccine: Up to date Tdap vaccine: Up to date -Due 06/26/2024 Shingles vaccine: Discuss with pharmacy   Covid-19: Up to date  Advanced directives: Please bring a copy for your chart once completed  Conditions/risks identified: See problem list  Next appointment: Follow up in one year for your annual wellness visit. 04/29/2022 @ 8:20  Preventive Care 65 Years and Older, Male Preventive care refers to lifestyle choices and visits with your health care provider that can promote health and wellness. What does preventive care include? A yearly physical exam. This is also called an annual well check. Dental exams once or twice a year. Routine eye exams. Ask your health care provider how often you should have your eyes checked. Personal lifestyle choices, including: Daily care of your teeth and gums. Regular physical activity. Eating a healthy diet. Avoiding tobacco and drug use. Limiting alcohol use. Practicing safe sex. Taking low doses of aspirin every day. Taking vitamin and mineral supplements as recommended by your health care provider. What happens during an annual well check? The services and screenings done by your health care provider during your annual well check will depend on your age, overall health, lifestyle risk factors, and family history of disease. Counseling  Your health care provider may ask you questions about your: Alcohol use. Tobacco use. Drug use. Emotional  well-being. Home and relationship well-being. Sexual activity. Eating habits. History of falls. Memory and ability to understand (cognition). Work and work Astronomer. Screening  You may have the following tests or measurements: Height, weight, and BMI. Blood pressure. Lipid and cholesterol levels. These may be checked every 5 years, or more frequently if you are over 110 years old. Skin check. Lung cancer screening. You may have this screening every year starting at age 20 if you have a 30-pack-year history of smoking and currently smoke or have quit within the past 15 years. Fecal occult blood test (FOBT) of the stool. You may have this test every year starting at age 18. Flexible sigmoidoscopy or colonoscopy. You may have a sigmoidoscopy every 5 years or a colonoscopy every 10 years starting at age 8. Prostate cancer screening. Recommendations will vary depending on your family history and other risks. Hepatitis C blood test. Hepatitis B blood test. Sexually transmitted disease (STD) testing. Diabetes screening. This is done by checking your blood sugar (glucose) after you have not eaten for a while (fasting). You may have this done every 1-3 years. Abdominal aortic aneurysm (AAA) screening. You may need this if you are a current or former smoker. Osteoporosis. You may be screened starting at age 66 if you are at high risk. Talk with your health care provider about your test results, treatment options, and if necessary, the need for more tests. Vaccines  Your health care provider may recommend certain vaccines, such as: Influenza vaccine. This is recommended every year. Tetanus, diphtheria, and acellular pertussis (Tdap, Td) vaccine. You may need a Td booster every 10 years. Zoster vaccine. You may need this after age 64. Pneumococcal 13-valent conjugate (PCV13) vaccine. One dose is recommended  after age 20. Pneumococcal polysaccharide (PPSV23) vaccine. One dose is recommended after  age 86. Talk to your health care provider about which screenings and vaccines you need and how often you need them. This information is not intended to replace advice given to you by your health care provider. Make sure you discuss any questions you have with your health care provider. Document Released: 11/21/2015 Document Revised: 07/14/2016 Document Reviewed: 08/26/2015 Elsevier Interactive Patient Education  2017 [place] Prevention in the Home Falls can cause injuries. They can happen to people of all ages. There are many things you can do to make your home safe and to help prevent falls. What can I do on the outside of my home? Regularly fix the edges of walkways and driveways and fix any cracks. Remove anything that might make you trip as you walk through a door, such as a raised step or threshold. Trim any bushes or trees on the path to your home. Use bright outdoor lighting. Clear any walking paths of anything that might make someone trip, such as rocks or tools. Regularly check to see if handrails are loose or broken. Make sure that both sides of any steps have handrails. Any raised decks and porches should have guardrails on the edges. Have any leaves, snow, or ice cleared regularly. Use sand or salt on walking paths during winter. Clean up any spills in your garage right away. This includes oil or grease spills. What can I do in the bathroom? Use night lights. Install grab bars by the toilet and in the tub and shower. Do not use towel bars as grab bars. Use non-skid mats or decals in the tub or shower. If you need to sit down in the shower, use a plastic, non-slip stool. Keep the floor dry. Clean up any water that spills on the floor as soon as it happens. Remove soap buildup in the tub or shower regularly. Attach bath mats securely with double-sided non-slip rug tape. Do not have throw rugs and other things on the floor that can make you trip. What can I do in the  bedroom? Use night lights. Make sure that you have a light by your bed that is easy to reach. Do not use any sheets or blankets that are too big for your bed. They should not hang down onto the floor. Have a firm chair that has side arms. You can use this for support while you get dressed. Do not have throw rugs and other things on the floor that can make you trip. What can I do in the kitchen? Clean up any spills right away. Avoid walking on wet floors. Keep items that you use a lot in easy-to-reach places. If you need to reach something above you, use a strong step stool that has a grab bar. Keep electrical cords out of the way. Do not use floor polish or wax that makes floors slippery. If you must use wax, use non-skid floor wax. Do not have throw rugs and other things on the floor that can make you trip. What can I do with my stairs? Do not leave any items on the stairs. Make sure that there are handrails on both sides of the stairs and use them. Fix handrails that are broken or loose. Make sure that handrails are as long as the stairways. Check any carpeting to make sure that it is firmly attached to the stairs. Fix any carpet that is loose or worn. Avoid having throw  rugs at the top or bottom of the stairs. If you do have throw rugs, attach them to the floor with carpet tape. Make sure that you have a light switch at the top of the stairs and the bottom of the stairs. If you do not have them, ask someone to add them for you. What else can I do to help prevent falls? Wear shoes that: Do not have high heels. Have rubber bottoms. Are comfortable and fit you well. Are closed at the toe. Do not wear sandals. If you use a stepladder: Make sure that it is fully opened. Do not climb a closed stepladder. Make sure that both sides of the stepladder are locked into place. Ask someone to hold it for you, if possible. Clearly mark and make sure that you can see: Any grab bars or  handrails. First and last steps. Where the edge of each step is. Use tools that help you move around (mobility aids) if they are needed. These include: Canes. Walkers. Scooters. Crutches. Turn on the lights when you go into a dark area. Replace any light bulbs as soon as they burn out. Set up your furniture so you have a clear path. Avoid moving your furniture around. If any of your floors are uneven, fix them. If there are any pets around you, be aware of where they are. Review your medicines with your doctor. Some medicines can make you feel dizzy. This can increase your chance of falling. Ask your doctor what other things that you can do to help prevent falls. This information is not intended to replace advice given to you by your health care provider. Make sure you discuss any questions you have with your health care provider. Document Released: 08/21/2009 Document Revised: 04/01/2016 Document Reviewed: 11/29/2014 Elsevier Interactive Patient Education  2017 [employer].

## 2021-06-26 NOTE — Progress Notes (Signed)
[facility] [address], [address] [place], [place] [address] [phone]  Date:  06/29/2021   Name:  Bill Price   DOB:  [birth year]   MRN:  [id number]  PCP:  Pearline Cables, MD    Chief Complaint: Anemia (Follow up, discuss specific diet/)   History of Present Illness:  Bill Price is a 87 y.o. very pleasant male patient who presents with the following:  Patient seen today for follow-up and to discuss COVID-19 booster Last seen by myself in February with hip pain; the year prior he had a right hip replacement following a fall and hip fracture  He is not currently taking any medications Can update labs today  Can suggest shingles vaccine as well as COVID vaccination- he has had 3 doses so far- booster/ 3rd dose done in November He would like to do a 4th dose today- can be done at our pharmacy   He is still doing a lot of woodworking   He lives alone- does all his own driving and chores  He states that he is doing ok  His granddaughter in [place] calls him daily and checks in on him  He is feeling well and does not have any concerns today  Patient Active Problem List   Diagnosis Date Noted   Closed displaced fracture of right femoral neck (HCC) 06/11/2020   PVD (peripheral vascular disease) (HCC) 06/11/2020   Impotence of organic origin 01/28/2014   ACTINIC KERATOSIS, HEAD 10/05/2010   ANXIETY 06/04/2010   HYPOTENSION, ORTHOSTATIC 06/04/2010   IRRITABLE BOWEL SYNDROME 04/15/2010   PERIPHERAL VASCULAR DISEASE 09/10/2009   ALLERGIC RHINITIS 09/10/2009   GERD 04/22/2009   ARTHRITIS, HIP 05/15/2008   OSTEOPENIA 01/17/2008   HYPERLIPIDEMIA 09/21/2007   RENAL CALCULUS, HX OF 09/21/2007    Past Medical History:  Diagnosis Date   Allergic rhinitis    Anxiety    Arthritis    Colon polyp    GERD (gastroesophageal reflux disease)    Hemorrhoids    Hyperlipidemia    IBS (irritable bowel syndrome)     Impotence    Kidney stone    Orthostatic hypotension    Osteopenia    PVD (peripheral vascular disease) (HCC)    Urinary obstruction     Past Surgical History:  Procedure Laterality Date   COLONOSCOPY  2007   HERNIA REPAIR  2005   TOTAL HIP ARTHROPLASTY Right 06/10/2020   Procedure: TOTAL HIP ARTHROPLASTY ANTERIOR APPROACH;  Surgeon: Samson Frederic, MD;  Location: [facility];  Service: Orthopedics;  Laterality: Right;    Social History   Tobacco Use   Smoking status: Never   Smokeless tobacco: Never  Vaping Use   Vaping Use: Never used  Substance Use Topics   Alcohol use: Yes    Alcohol/week: 0.0 standard drinks    Comment: 1 glass of wine a day   Drug use: No    Family History  Problem Relation Age of Onset   Prostate cancer Brother    Colon cancer Neg Hx    Colon polyps Neg Hx    Diabetes Neg Hx    Kidney disease Neg Hx    Esophageal cancer Neg Hx    Gallbladder disease Neg Hx    Heart disease Neg Hx     Allergies  Allergen Reactions   Augmentin [Amoxicillin-Pot Clavulanate] Diarrhea   Crestor [Rosuvastatin]     Shaky, hot flashes    Medication  list has been reviewed and updated.  No current outpatient medications on file prior to visit.   No current facility-administered medications on file prior to visit.    Review of Systems:  As per HPI- otherwise negative.   Physical Examination: Vitals:   06/29/21 1437  BP: 128/82  Pulse: 74  Resp: 17  Temp: (!) 97.4 F (36.3 C)  SpO2: 95%   Vitals:   06/29/21 1437  Weight: 182 lb (82.6 kg)  Height: 5\' 8"  (1.727 m)   Body mass index is 27.67 kg/m. Ideal Body Weight: Weight in (lb) to have BMI = 25: 164.1  GEN: no acute distress.  Looks well, normal weight for age  HEENT: Atraumatic, Normocephalic.  Ears and Nose: No external deformity. CV: RRR, No M/G/R. No JVD. No thrill. No extra heart sounds. PULM: CTA B, no wheezes, crackles, rhonchi. No retractions. No resp. distress. No accessory muscle  use. EXTR: No c/c/e PSYCH: Normally interactive. Conversant.   Wt Readings from Last 3 Encounters:  06/29/21 182 lb (82.6 kg)  04/23/21 182 lb 3.2 oz (82.6 kg)  12/31/20 178 lb (80.7 kg)    Assessment and Plan: Immunization due  Screening for deficiency anemia - Plan: CBC  Screening for diabetes mellitus - Plan: Comprehensive metabolic panel Plan for covid 4th dose today. Also recommended shingles vaccine Labs pending as above I called his grand-daughter and LMOM with my name and our phone number in case she ever needs to contact 01/02/21  This visit occurred during the SARS-CoV-2 public health emergency.  Safety protocols were in place, including screening questions prior to the visit, additional usage of staff PPE, and extensive cleaning of exam room while observing appropriate contact time as indicated for disinfecting solutions.   Signed [place], MD

## 2021-06-29 ENCOUNTER — Ambulatory Visit: Payer: Medicare Other | Attending: Internal Medicine

## 2021-06-29 ENCOUNTER — Other Ambulatory Visit: Payer: Self-pay

## 2021-06-29 ENCOUNTER — Ambulatory Visit (INDEPENDENT_AMBULATORY_CARE_PROVIDER_SITE_OTHER): Payer: Medicare Other | Admitting: Family Medicine

## 2021-06-29 VITALS — BP 128/82 | HR 74 | Temp 97.4°F | Resp 17 | Ht 68.0 in | Wt 182.0 lb

## 2021-06-29 DIAGNOSIS — Z23 Encounter for immunization: Secondary | ICD-10-CM

## 2021-06-29 DIAGNOSIS — Z130 Encounter for screening for diseases of the blood and blood-forming organs and certain disorders involving the immune mechanism: Secondary | ICD-10-CM

## 2021-06-29 DIAGNOSIS — Z131 Encounter for screening for diabetes mellitus: Secondary | ICD-10-CM | POA: Diagnosis not present

## 2021-06-29 NOTE — Patient Instructions (Addendum)
It was good to see you again today-  I will be in touch with your labs asap You can get a covid booster/ 4th dose today You are also ok to get the shingles vaccine   Please let me know if you are not doing ok on your own- if you need any help with getting groceries, etc Olegario Messier can always call me

## 2021-06-29 NOTE — Progress Notes (Signed)
   Covid-19 Vaccination Clinic  Name:  ROLLO FARQUHAR    MRN: [id number] DOB: 14-Jan-1929  06/29/2021  Mr. Lyness was observed post Covid-19 immunization for 15 minutes without incident. He was provided with Vaccine Information Sheet and instruction to access the V-Safe system.   Mr. Hickling was instructed to call 911 with any severe reactions post vaccine: Difficulty breathing  Swelling of face and throat  A fast heartbeat  A bad rash all over body  Dizziness and weakness   Immunizations Administered     Name Date Dose VIS Date Route   Moderna Covid-19 Booster Vaccine 06/29/2021  3:17 PM 0.25 mL 08/27/2020 Intramuscular   Manufacturer: Moderna   Lot: [id number]   NDC: 49753-005-11

## 2021-06-30 ENCOUNTER — Encounter: Payer: Self-pay | Admitting: Family Medicine

## 2021-06-30 DIAGNOSIS — D649 Anemia, unspecified: Secondary | ICD-10-CM

## 2021-06-30 LAB — COMPREHENSIVE METABOLIC PANEL
ALT: 12 U/L (ref 0–53)
AST: 19 U/L (ref 0–37)
Albumin: 3.7 g/dL (ref 3.5–5.2)
Alkaline Phosphatase: 79 U/L (ref 39–117)
BUN: 19 mg/dL (ref 6–23)
CO2: 28 mEq/L (ref 19–32)
Calcium: 9.1 mg/dL (ref 8.4–10.5)
Chloride: 104 mEq/L (ref 96–112)
Creatinine, Ser: 1.01 mg/dL (ref 0.40–1.50)
GFR: 65.45 mL/min (ref >60.00)
Glucose, Bld: 95 mg/dL (ref 70–99)
Potassium: 4.4 mEq/L (ref 3.5–5.1)
Sodium: 139 mEq/L (ref 135–145)
Total Bilirubin: 0.4 mg/dL (ref 0.2–1.2)
Total Protein: 6.7 g/dL (ref 6.0–8.3)

## 2021-06-30 LAB — CBC
HCT: 35.4 % — ABNORMAL LOW (ref 39.0–52.0)
Hemoglobin: 11.9 g/dL — ABNORMAL LOW (ref 13.0–17.0)
MCHC: 33.6 g/dL (ref 30.0–36.0)
MCV: 98.7 fl (ref 78.0–100.0)
Platelets: 130 10*3/uL — ABNORMAL LOW (ref 150.0–400.0)
RBC: 3.59 Mil/uL — ABNORMAL LOW (ref 4.22–5.81)
RDW: 13.1 % (ref 11.5–15.5)
WBC: 5.4 10*3/uL (ref 4.0–10.5)

## 2021-06-30 NOTE — Progress Notes (Signed)
Results for orders placed or performed in visit on 06/29/21  CBC  Result Value Ref Range   WBC 5.4 4.0 - 10.5 K/uL   RBC 3.59 (L) 4.22 - 5.81 Mil/uL   Platelets 130.0 (L) 150.0 - 400.0 K/uL   Hemoglobin 11.9 (L) 13.0 - 17.0 g/dL   HCT 79.4 (L) 80.1 - 65.5 %   MCV 98.7 78.0 - 100.0 fl   MCHC 33.6 30.0 - 36.0 g/dL   RDW 37.4 82.7 - 07.8 %  Comprehensive metabolic panel  Result Value Ref Range   Sodium 139 135 - 145 mEq/L   Potassium 4.4 3.5 - 5.1 mEq/L   Chloride 104 96 - 112 mEq/L   CO2 28 19 - 32 mEq/L   Glucose, Bld 95 70 - 99 mg/dL   BUN 19 6 - 23 mg/dL   Creatinine, Ser 6.75 0.40 - 1.50 mg/dL   Total Bilirubin 0.4 0.2 - 1.2 mg/dL   Alkaline Phosphatase 79 39 - 117 U/L   AST 19 0 - 37 U/L   ALT 12 0 - 53 U/L   Total Protein 6.7 6.0 - 8.3 g/dL   Albumin 3.7 3.5 - 5.2 g/dL   GFR 44.92 >01.00 mL/min   Calcium 9.1 8.4 - 10.5 mg/dL

## 2021-07-20 ENCOUNTER — Other Ambulatory Visit (HOSPITAL_BASED_OUTPATIENT_CLINIC_OR_DEPARTMENT_OTHER): Payer: Self-pay

## 2021-07-20 MED ORDER — COVID-19 MRNA VACC (MODERNA) 100 MCG/0.5ML IM SUSP
INTRAMUSCULAR | 0 refills | Status: DC
  Filled 2021-07-20: qty 0.25, 1d supply, fill #0

## 2022-03-29 IMAGING — DX DG HIP (WITH OR WITHOUT PELVIS) 1V PORT*R*
1 series · 1 of 1 positions shown · non-contrast
Comparison: Right hip radiograph dated 05/27/2020.

CLINICAL DATA: 89-year-old male with fall and right lower extremity
pain.

EXAM:
PORTABLE RIGHT KNEE - 1-2 VIEW; DG HIP (WITH OR WITHOUT PELVIS) 1V
PORT RIGHT

[hip ap]
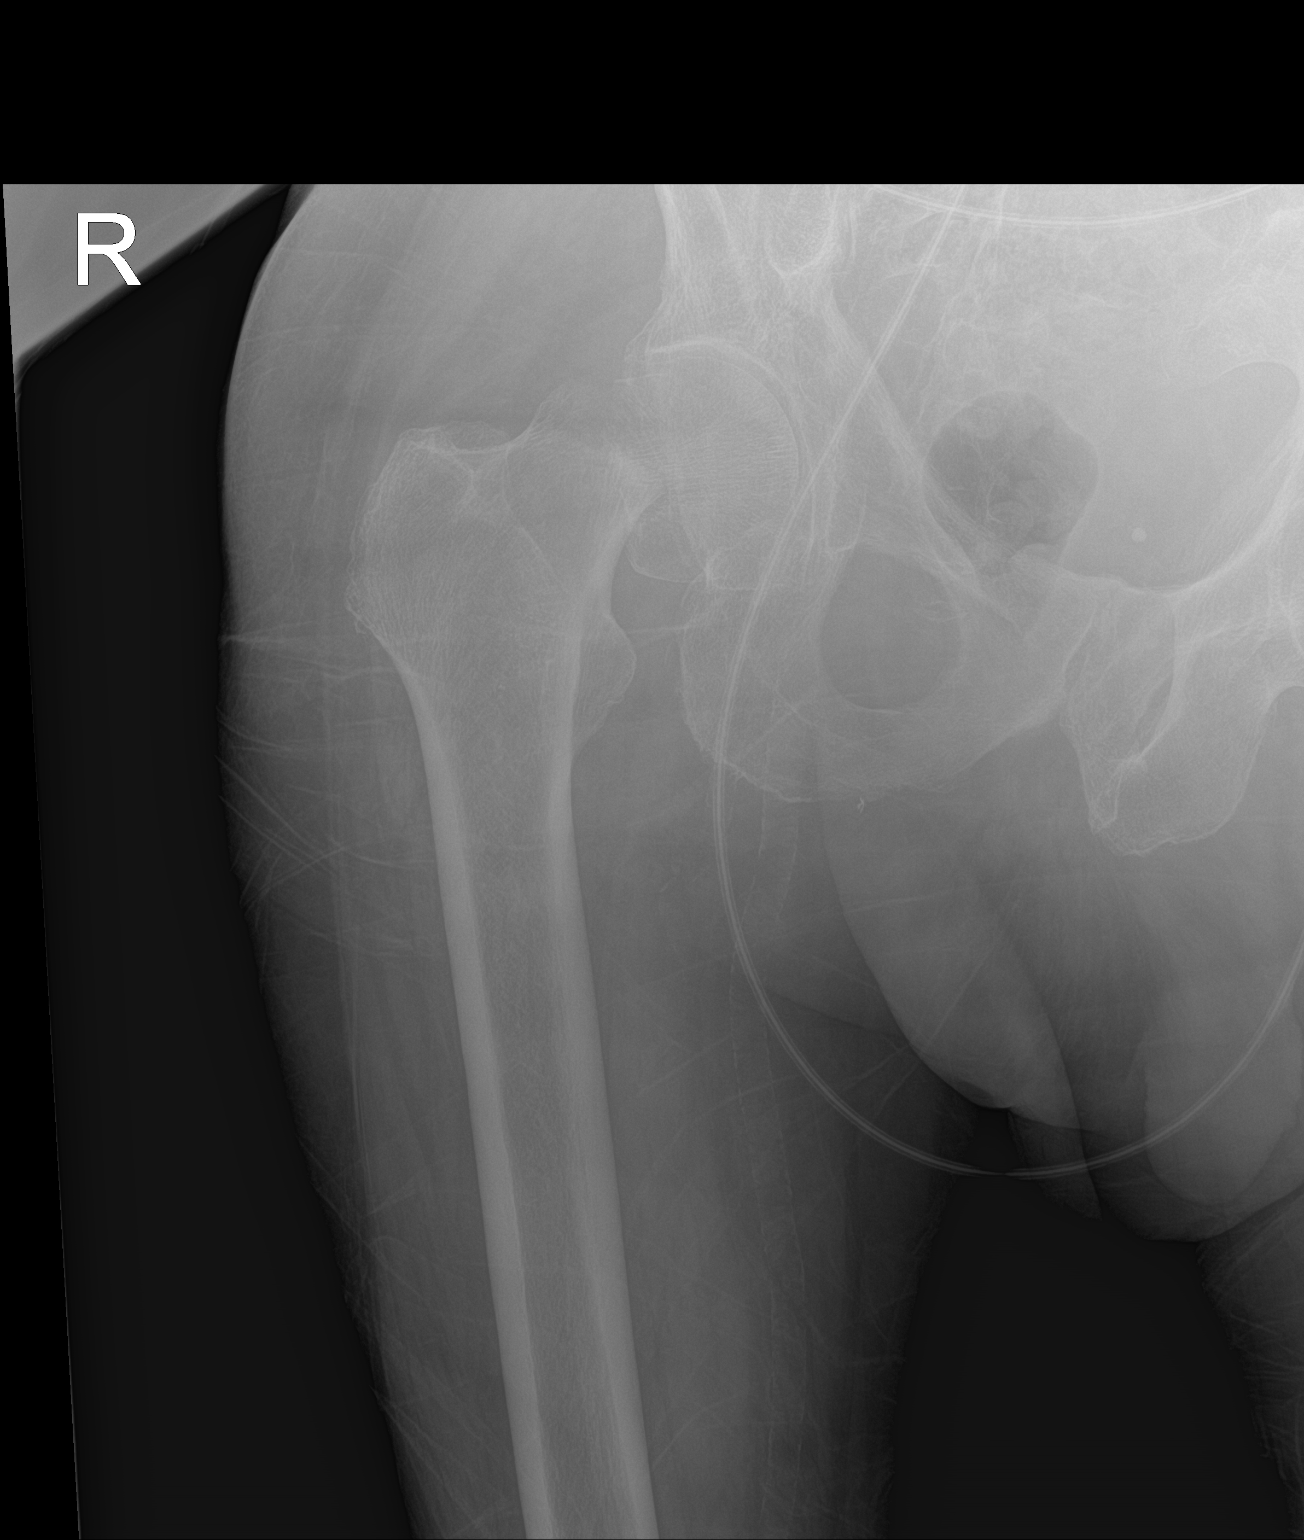

[1 of 1 positions shown; findings below may reference images not displayed]

FINDINGS: There is a displaced fracture of the right femoral neck with
impaction and mild varus angulation. The bones are osteopenic. There
is no dislocation. Mild degenerative changes of the hips and knee.
No joint effusion. The soft tissues are unremarkable.
IMPRESSION: Displaced fracture of the right femoral neck.

## 2022-03-30 IMAGING — DX DG PORTABLE PELVIS
1 series · 1 of 1 positions shown · non-contrast
Comparison: 06/09/2020

CLINICAL DATA: Hip replacement

EXAM:
PORTABLE PELVIS 1-2 VIEWS

[pelvis ap]
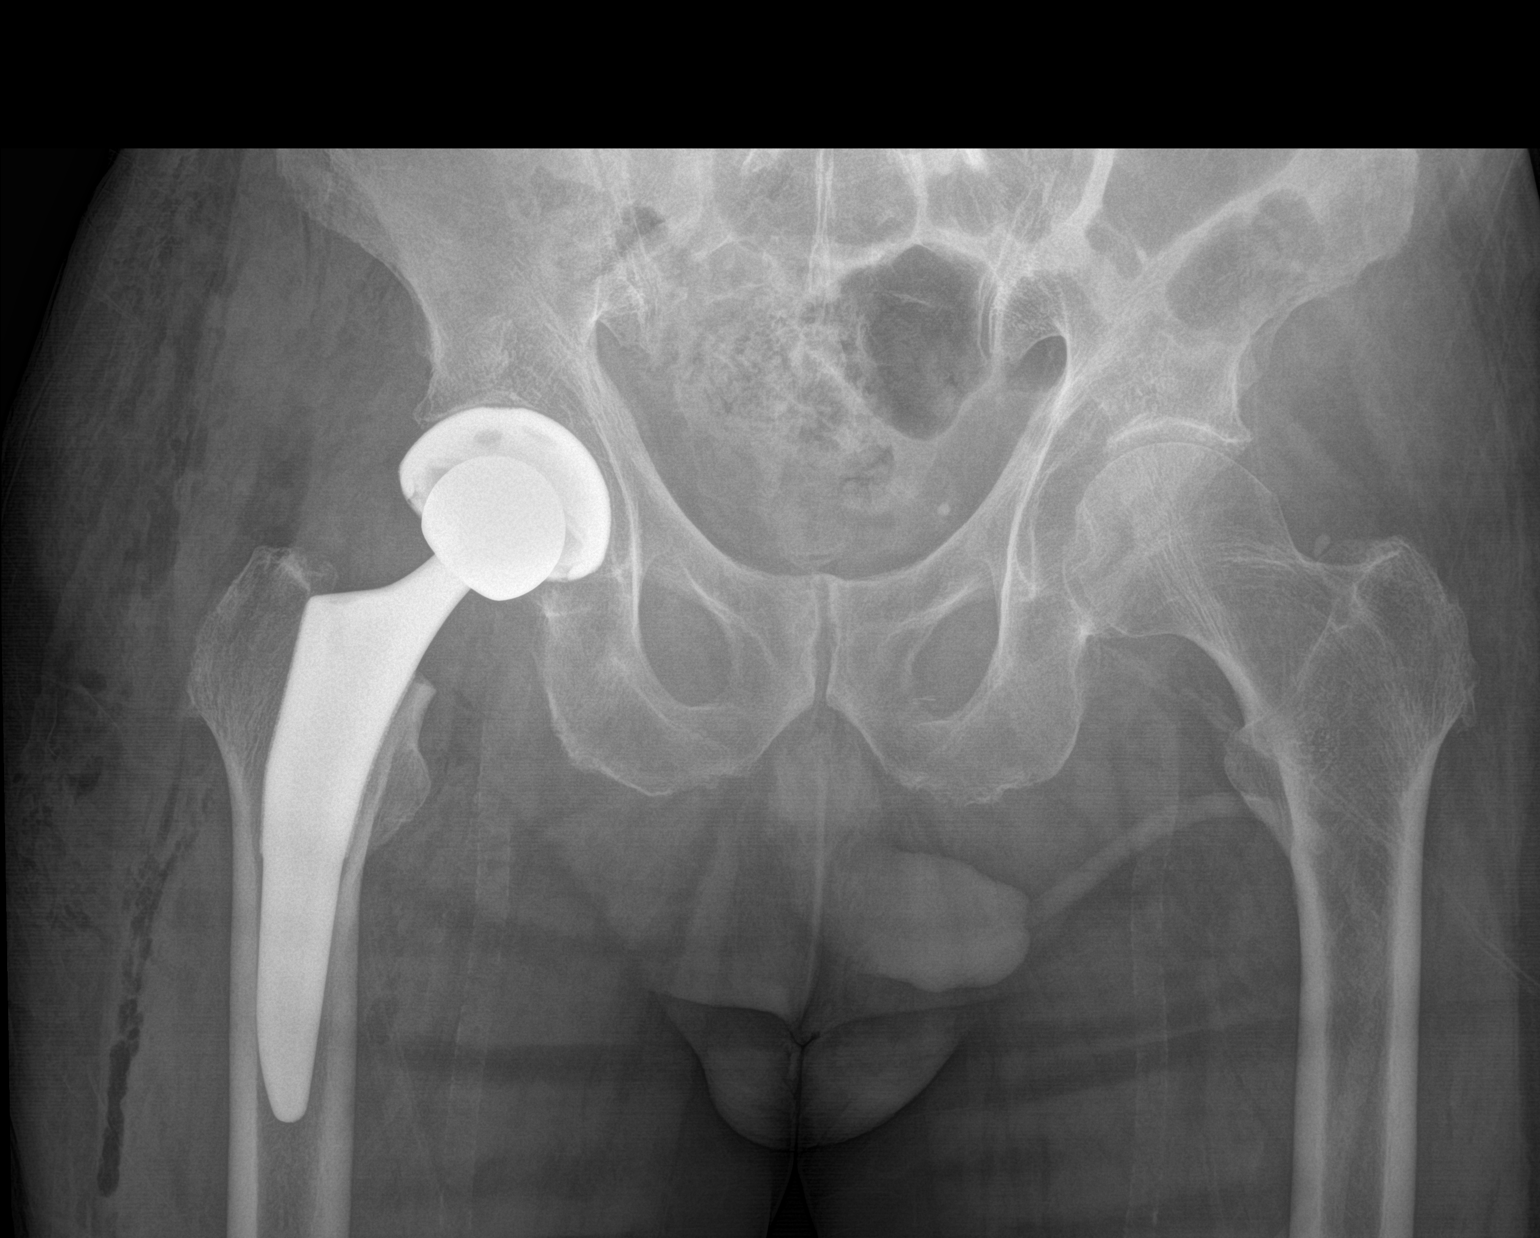

[1 of 1 positions shown; findings below may reference images not displayed]

FINDINGS: The patient has undergone total hip arthroplasty on the right. The
alignment appears near anatomic. There are expected postsurgical
changes. There is no periprosthetic fracture. Mild degenerative
changes are noted of the left hip.
IMPRESSION: Satisfactory postoperative appearance following right total hip
arthroplasty.

## 2022-04-28 NOTE — Progress Notes (Deleted)
Subjective:   Bill Price is a 88 y.o. male who presents for Medicare Annual/Subsequent preventive examination.  Review of Systems    ***       Objective:    There were no vitals filed for this visit. There is no height or weight on file to calculate BMI.     04/23/2021    8:08 AM 06/09/2020    6:20 PM 06/09/2020    1:13 PM 05/27/2020    9:27 AM 04/17/2020    8:09 AM 04/03/2019   11:03 AM 03/30/2018   11:05 AM  Advanced Directives  Does Patient Have a Medical Advance Directive? No Yes No No Yes No No  Type of Sales promotion account executive of [place];Living will    Does patient want to make changes to medical advance directive?  No - Patient declined       Copy of Healthcare Power of Attorney in Chart?     No - copy requested    Would patient like information on creating a medical advance directive? Yes (MAU/Ambulatory/Procedural Areas - Information given)     No - Patient declined No - Patient declined    Current Medications (verified) Outpatient Encounter Medications as of 04/29/2022  Medication Sig   COVID-19 mRNA vaccine, Moderna, 100 MCG/0.5ML injection Inject into the muscle.   No facility-administered encounter medications on file as of 04/29/2022.    Allergies (verified) Augmentin [amoxicillin-pot clavulanate] and Crestor [rosuvastatin]   History: Past Medical History:  Diagnosis Date   Allergic rhinitis    Anxiety    Arthritis    Colon polyp    GERD (gastroesophageal reflux disease)    Hemorrhoids    Hyperlipidemia    IBS (irritable bowel syndrome)    Impotence    Kidney stone    Orthostatic hypotension    Osteopenia    PVD (peripheral vascular disease) (HCC)    Urinary obstruction    Past Surgical History:  Procedure Laterality Date   COLONOSCOPY  2007   HERNIA REPAIR  2005   TOTAL HIP ARTHROPLASTY Right 06/10/2020   Procedure: TOTAL HIP ARTHROPLASTY ANTERIOR APPROACH;  Surgeon: Samson Frederic, MD;  Location:  [facility];  Service: Orthopedics;  Laterality: Right;   Family History  Problem Relation Age of Onset   Prostate cancer Brother    Colon cancer Neg Hx    Colon polyps Neg Hx    Diabetes Neg Hx    Kidney disease Neg Hx    Esophageal cancer Neg Hx    Gallbladder disease Neg Hx    Heart disease Neg Hx    Social History   Socioeconomic History   Marital status: Widowed    Spouse name: Not on file   Number of children: 0   Years of education: Not on file   Highest education level: Not on file  Occupational History   Occupation: Retired  Tobacco Use   Smoking status: Never   Smokeless tobacco: Never  Vaping Use   Vaping Use: Never used  Substance and Sexual Activity   Alcohol use: Yes    Alcohol/week: 0.0 standard drinks of alcohol    Comment: 1 glass of wine a day   Drug use: No   Sexual activity: Never  Other Topics Concern   Not on file  Social History Narrative   Not on file   Social Determinants of Health   Financial Resource Strain: Low Risk  (04/23/2021)   Overall Financial Resource  Strain (CARDIA)    Difficulty of Paying Living Expenses: Not hard at all  Food Insecurity: No Food Insecurity (04/23/2021)   Hunger Vital Sign    Worried About Running Out of Food in the Last Year: Never true    Ran Out of Food in the Last Year: Never true  Transportation Needs: No Transportation Needs (04/23/2021)   PRAPARE - Administrator, Civil Service (Medical): No    Lack of Transportation (Non-Medical): No  Physical Activity: Inactive (04/23/2021)   Exercise Vital Sign    Days of Exercise per Week: 0 days    Minutes of Exercise per Session: 0 min  Stress: No Stress Concern Present (04/23/2021)   [employer] of Occupational Health - Occupational Stress Questionnaire    Feeling of Stress : Not at all  Social Connections: Moderately Isolated (04/23/2021)   Social Connection and Isolation Panel [NHANES]    Frequency of Communication with Friends and Family: More  than three times a week    Frequency of Social Gatherings with Friends and Family: More than three times a week    Attends Religious Services: Never    Database administrator or Organizations: No    Attends Banker Meetings: 1 to 4 times per year    Marital Status: Widowed    Tobacco Counseling Counseling given: Not Answered   Clinical Intake:                 Diabetic?no         Activities of Daily Living     No data to display          Patient Care Team: Copland, Gwenlyn Found, MD as PCP - General (Family Medicine)  Indicate any recent Medical Services you may have received from other than [facility] providers in the past year (date may be approximate).     Assessment:   This is a routine wellness examination for [place].  Hearing/Vision screen No results found.  Dietary issues and exercise activities discussed:     Goals Addressed   None    Depression Screen    06/29/2021    2:38 PM 04/23/2021    8:11 AM 04/17/2020    8:13 AM 04/03/2019   11:04 AM 03/30/2018   11:06 AM 03/29/2017   11:09 AM 10/09/2015    2:19 PM  PHQ 2/9 Scores  PHQ - 2 Score 0 0 0 0 0 0 0    Fall Risk    04/23/2021    8:10 AM 04/17/2020    8:12 AM 04/03/2019   11:04 AM 03/30/2018   11:06 AM 03/29/2017   11:09 AM  Fall Risk   Falls in the past year? 0 1 0 No No  Number falls in past yr: 0 1     Injury with Fall? 0 0     Risk for fall due to :  History of fall(s)     Follow up Falls prevention discussed Falls evaluation completed;Falls prevention discussed       FALL RISK PREVENTION PERTAINING TO THE HOME:  Any stairs in or around the home? {YES/NO:21197} If so, are there any without handrails? {YES/NO:21197} Home free of loose throw rugs in walkways, pet beds, electrical cords, etc? {YES/NO:21197} Adequate lighting in your home to reduce risk of falls? {YES/NO:21197}  ASSISTIVE DEVICES UTILIZED TO PREVENT FALLS:  Life alert? {YES/NO:21197} Use of a cane,  walker or w/c? {YES/NO:21197} Grab bars in the bathroom? {YES/NO:21197} Shower chair or bench  in shower? {YES/NO:21197} Elevated toilet seat or a handicapped toilet? {YES/NO:21197}  TIMED UP AND GO:  Was the test performed? {YES/NO:21197}.  Length of time to ambulate 10 feet: *** sec.   {Appearance of PYKD:9833825}  Cognitive Function:    03/29/2017   11:10 AM  MMSE - Mini Mental State Exam  Orientation to time 5  Orientation to Place 5  Registration 3  Attention/ Calculation 4  Recall 2  Language- name 2 objects 2  Language- repeat 1  Language- follow 3 step command 3  Language- read & follow direction 1  Write a sentence 1  Copy design 0  Total score 27        04/17/2020    8:16 AM  6CIT Screen  What Year? 0 points  What month? 0 points  What time? 0 points  Count back from 20 0 points  Months in reverse 0 points  Repeat phrase 0 points  Total Score 0 points    Immunizations Immunization History  Administered Date(s) Administered   Influenza Split 08/26/2011, 08/08/2012   Influenza Whole 09/21/2007, 08/20/2009, 08/04/2010   Influenza, High Dose Seasonal PF 08/16/2016, 08/24/2017, 10/13/2018   Influenza,inj,Quad PF,6+ Mos 08/16/2013, 09/19/2014, 08/07/2015   Influenza-Unspecified 07/09/2020   Moderna SARS-COV2 Booster Vaccination 06/29/2021   Moderna Sars-Covid-2 Vaccination 01/23/2020, 02/20/2020, 09/18/2020   Pneumococcal Conjugate-13 10/24/2014   Pneumococcal Polysaccharide-23 08/31/2017   Tdap 03/09/2008    {TDAP status:2101805}  {Flu Vaccine status:2101806}  Pneumococcal vaccine status: Up to date  {Covid-19 vaccine status:2101808}  Qualifies for Shingles Vaccine? {YES/NO:21197}  Zostavax completed {YES/NO:21197}  {Shingrix Completed?:2101804}  Screening Tests Health Maintenance  Topic Date Due   Zoster Vaccines- Shingrix (1 of 2) Never done   COVID-19 Vaccine (4 - Booster for Moderna series) 08/24/2021   INFLUENZA VACCINE  06/08/2022    TETANUS/TDAP  06/26/2024   Pneumonia Vaccine 23+ Years old  Completed   HPV VACCINES  Aged Out    Health Maintenance  Health Maintenance Due  Topic Date Due   Zoster Vaccines- Shingrix (1 of 2) Never done   COVID-19 Vaccine (4 - Booster for Moderna series) 08/24/2021    Colorectal cancer screening: No longer required.   Lung Cancer Screening: (Low Dose CT Chest recommended if Age 74-80 years, 30 pack-year currently smoking OR have quit w/in 15years.) does not qualify.   Lung Cancer Screening Referral: N/A  Additional Screening:  Hepatitis C Screening: does not qualify; Completed aged out  Vision Screening: Recommended annual ophthalmology exams for early detection of glaucoma and other disorders of the eye. Is the patient up to date with their annual eye exam?  {YES/NO:21197} Who is the provider or what is the name of the office in which the patient attends annual eye exams? *** If pt is not established with a provider, would they like to be referred to a provider to establish care? {YES/NO:21197}.   Dental Screening: Recommended annual dental exams for proper oral hygiene  Community Resource Referral / Chronic Care Management: CRR required this visit?  {YES/NO:21197}  CCM required this visit?  {YES/NO:21197}     Plan:     I have personally reviewed and noted the following in the patient's chart:   Medical and social history Use of alcohol, tobacco or illicit drugs  Current medications and supplements including opioid prescriptions. {Opioid Prescriptions:[phone]} Functional ability and status Nutritional status Physical activity Advanced directives List of other physicians Hospitalizations, surgeries, and ER visits in previous 12 months Vitals Screenings to include cognitive, depression, and  falls Referrals and appointments  In addition, I have reviewed and discussed with patient certain preventive protocols, quality metrics, and best practice  recommendations. A written personalized care plan for preventive services as well as general preventive health recommendations were provided to patient.     Salomon Mast Joshuajames Moehring, CMA   04/28/2022   Nurse Notes: ***

## 2022-04-29 ENCOUNTER — Ambulatory Visit: Payer: Medicare Other

## 2022-05-10 ENCOUNTER — Other Ambulatory Visit (INDEPENDENT_AMBULATORY_CARE_PROVIDER_SITE_OTHER): Payer: Medicare Other

## 2022-05-10 ENCOUNTER — Telehealth: Payer: Self-pay | Admitting: Family Medicine

## 2022-05-10 DIAGNOSIS — D649 Anemia, unspecified: Secondary | ICD-10-CM | POA: Diagnosis not present

## 2022-05-10 NOTE — Telephone Encounter (Signed)
Patient brought in a sample for labs & left a yellow CPE form with [place] for [facility] to fill out.. Placed into [facility] bin up front

## 2022-05-10 NOTE — Addendum Note (Signed)
Addended by: Mervin Kung A on: 05/10/2022 10:54 AM   Modules accepted: Orders

## 2022-05-12 LAB — FECAL OCCULT BLOOD, IMMUNOCHEMICAL: Fecal Occult Bld: NEGATIVE

## 2022-05-13 NOTE — Telephone Encounter (Signed)
Form in folder for completion.  

## 2022-06-09 ENCOUNTER — Emergency Department (HOSPITAL_BASED_OUTPATIENT_CLINIC_OR_DEPARTMENT_OTHER)
Admission: EM | Admit: 2022-06-09 | Discharge: 2022-06-09 | Disposition: A | Discharge status: Home / Self Care | Payer: Medicare Other | Attending: Emergency Medicine | Admitting: Emergency Medicine

## 2022-06-09 ENCOUNTER — Emergency Department (HOSPITAL_BASED_OUTPATIENT_CLINIC_OR_DEPARTMENT_OTHER): Payer: Medicare Other

## 2022-06-09 ENCOUNTER — Encounter (HOSPITAL_BASED_OUTPATIENT_CLINIC_OR_DEPARTMENT_OTHER): Payer: Self-pay | Admitting: Emergency Medicine

## 2022-06-09 DIAGNOSIS — S20212A Contusion of left front wall of thorax, initial encounter: Secondary | ICD-10-CM | POA: Diagnosis not present

## 2022-06-09 DIAGNOSIS — S0990XA Unspecified injury of head, initial encounter: Secondary | ICD-10-CM | POA: Insufficient documentation

## 2022-06-09 DIAGNOSIS — Z23 Encounter for immunization: Secondary | ICD-10-CM | POA: Diagnosis not present

## 2022-06-09 DIAGNOSIS — W19XXXA Unspecified fall, initial encounter: Secondary | ICD-10-CM | POA: Insufficient documentation

## 2022-06-09 DIAGNOSIS — S51011A Laceration without foreign body of right elbow, initial encounter: Secondary | ICD-10-CM | POA: Diagnosis not present

## 2022-06-09 DIAGNOSIS — Y92009 Unspecified place in unspecified non-institutional (private) residence as the place of occurrence of the external cause: Secondary | ICD-10-CM | POA: Insufficient documentation

## 2022-06-09 DIAGNOSIS — S299XXA Unspecified injury of thorax, initial encounter: Secondary | ICD-10-CM | POA: Diagnosis present

## 2022-06-09 MED ORDER — TETANUS-DIPHTH-ACELL PERTUSSIS 5-2.5-18.5 LF-MCG/0.5 IM SUSY
0.5000 mL | PREFILLED_SYRINGE | Freq: Once | INTRAMUSCULAR | Status: AC
  Administered 2022-06-09: 0.5 mL via INTRAMUSCULAR
  Filled 2022-06-09: qty 0.5

## 2022-06-09 NOTE — ED Triage Notes (Signed)
Pt fell last night after his leg gave out. Pt c/o skin tears to R arm and a bruise to R upper chest. Denies other injuries. States he did not hit his head.

## 2022-06-09 NOTE — Discharge Instructions (Addendum)
Your CT imaging and x-ray imaging was negative for acute fracture or traumatic injury.  Your tetanus was updated.  Recommend outpatient follow-up with your PCP as needed.  Wound care instructions have been provided.

## 2022-06-09 NOTE — ED Provider Notes (Signed)
[facility] EMERGENCY DEPARTMENT Provider Note   CSN: [id number] Arrival date & time: 06/09/22  [id number]     History  Chief Complaint  Patient presents with   Lytle Michaels    Bill Price is a 88 y.o. male.   Fall    88 year old male presenting to the emergency department after a fall last night.  The patient states that the mechanism of the fall was mechanical when his leg gave out any fell and struck furniture.  He sustained a bruise to his left upper chest and sustained a skin tear to his right arm.  He states that his tetanus is not up-to-date.  He is not on anticoagulation.  He denies any loss of consciousness.  He denies any clear head trauma.  He denies any other injuries or complaints.  He is ambulatory.  He arrived GCS 15, ABC intact.  Home Medications Prior to Admission medications   Medication Sig Start Date End Date Taking? Authorizing Provider  COVID-19 mRNA vaccine, Moderna, 100 MCG/0.5ML injection Inject into the muscle. 06/29/21   Carlyle Basques, MD      Allergies    Augmentin [amoxicillin-pot clavulanate] and Crestor [rosuvastatin]    Review of Systems   Review of Systems  All other systems reviewed and are negative.   Physical Exam Updated Vital Signs BP (!) 150/76 (BP Location: Right Arm)   Pulse 75   Temp 98.2 F (36.8 C) (Oral)   Resp 18   Ht 5\' 8"  (1.727 m)   Wt 81.6 kg   SpO2 99%   BMI 27.37 kg/m  Physical Exam Vitals and nursing note reviewed.  Constitutional:      Appearance: He is well-developed.     Comments: GCS 15, ABC intact  HENT:     Head: Normocephalic.  Eyes:     Conjunctiva/sclera: Conjunctivae normal.  Neck:     Comments: No midline tenderness to palpation of the cervical spine. ROM intact. Cardiovascular:     Rate and Rhythm: Normal rate and regular rhythm.  Pulmonary:     Effort: Pulmonary effort is normal. No respiratory distress.     Breath sounds: Normal breath sounds.  Chest:     Comments: Chest wall stable  to AP and lateral compression. Clavicles stable and non-tender to AP compression.  Bruising to the left chest wall with some tenderness to palpation of the second rib Abdominal:     Palpations: Abdomen is soft.     Tenderness: There is no abdominal tenderness.     Comments: Pelvis stable to lateral compression.  Musculoskeletal:     Cervical back: Neck supple.     Comments: No midline tenderness to palpation of the thoracic or lumbar spine. Extremities atraumatic with intact ROM with the exception of a skin tear to the right forearm noted, hemostatic  Skin:    General: Skin is warm and dry.  Neurological:     Mental Status: He is alert.     Comments: CN II-XII grossly intact. Moving all four extremities spontaneously and sensation grossly intact.     ED Results / Procedures / Treatments   Labs (all labs ordered are listed, but only abnormal results are displayed) Labs Reviewed - No data to display  EKG None  Radiology DG Chest 2 View  Result Date: 06/09/2022 CLINICAL DATA:  fall EXAM: CHEST - 2 VIEW COMPARISON:  June 09, 2020 FINDINGS: The heart size and mediastinal contours are stable with ectatic thoracic aorta. No hemo or pneumothorax. There are  some interstitial changes seen and are more prominent at the lung bases. Bibasal minor atelectasis greater on the right and is more prominent in the present study. The visualized skeletal structures are unremarkable. IMPRESSION: Minor bibasilar atelectasis greater on the right and is new. No consolidation, hemo or pneumothorax. Electronically Signed   By: Marjo Bicker M.D.   On: 06/09/2022 10:12   DG Forearm Right  Result Date: 06/09/2022 CLINICAL DATA:  Fall.  Laceration EXAM: RIGHT FOREARM - 2 VIEW COMPARISON:  None Available. FINDINGS: Negative for fracture. No arthropathy or radiopaque foreign body. Soft tissue laceration of the forearm. IMPRESSION: Negative for fracture. Electronically Signed   By: Marlan Palau M.D.   On: 06/09/2022  10:11   CT Head Wo Contrast  Result Date: 06/09/2022 CLINICAL DATA:  Head trauma.  Status post fall. EXAM: CT HEAD WITHOUT CONTRAST TECHNIQUE: Contiguous axial images were obtained from the base of the skull through the vertex without intravenous contrast. RADIATION DOSE REDUCTION: This exam was performed according to the departmental dose-optimization program which includes automated exposure control, adjustment of the mA and/or kV according to patient size and/or use of iterative reconstruction technique. COMPARISON:  None Available. FINDINGS: Brain: No evidence of acute infarction, hemorrhage, hydrocephalus, extra-axial collection or mass lesion/mass effect. Prominence of the sulci and ventricles compatible with brain atrophy. There is mild diffuse low-attenuation within the subcortical and periventricular white matter compatible with chronic microvascular disease. Vascular: No hyperdense vessel or unexpected calcification. Skull: Normal. Negative for fracture or focal lesion. Sinuses/Orbits: Paranasal sinuses and mastoid air cells are clear. Other: None. IMPRESSION: 1. No acute intracranial abnormalities. 2. Chronic small vessel ischemic disease and brain atrophy. Electronically Signed   By: Signa Kell M.D.   On: 06/09/2022 09:52    Procedures Procedures    Medications Ordered in ED Medications  Tdap (BOOSTRIX) injection 0.5 mL (0.5 mLs Intramuscular Given 06/09/22 5701)    ED Course/ Medical Decision Making/ A&P                           Medical Decision Making Amount and/or Complexity of Data Reviewed Radiology: ordered.  Risk Prescription drug management.   88 year old male presenting to the emergency department after a fall last night.  The patient states that the mechanism of the fall was mechanical when his leg gave out any fell and struck furniture.  He sustained a bruise to his left upper chest and sustained a skin tear to his right arm.  He states that his tetanus is not  up-to-date.  He is not on anticoagulation.  He denies any loss of consciousness.  He denies any clear head trauma.  He denies any other injuries or complaints.  He is ambulatory.  He arrived GCS 15, ABC intact.  On arrival, the patient was vitally stable.  Physical exam significant for a skin tear to the right forearm, no other clear sign of extremity injury.  Neurologically intact and at his baseline mental status.  No midline tenderness of the cervical, thoracic or lumbar spine noted.  C-spine cleared by Nexus criteria.  Given the patient's age and mechanical fall, despite head trauma or loss of consciousness, will obtain CT imaging of the head to evaluate for possible intracranial injury.  Imaging of the chest and right forearm was performed.  CT head revealed no acute intracranial injury.  Evidence of old microvascular ischemic changes noted.  Neurologically intact with no deficit noted, low concern for acute CVA  at this time. IMPRESSION:  1. No acute intracranial abnormalities.  2. Chronic small vessel ischemic disease and brain atrophy.   Imaging of the chest and right forearm was negative for acute fracture or other sign of traumatic injury.  Some atelectasis was noted with no evidence of pneumothorax.  The patient's wound was cleaned and dressed by nursing.  He was provided with wound care instructions.  His tetanus was updated.  On repeat assessment, the patient was stable for discharge and continued outpatient management.   DC Instructions: Your CT imaging and x-ray imaging was negative for acute fracture or traumatic injury.  Your tetanus was updated.  Recommend outpatient follow-up with your PCP as needed.  Wound care instructions have been provided.   Final Clinical Impression(s) / ED Diagnoses Final diagnoses:  Fall in home, initial encounter  Contusion of left front wall of thorax, initial encounter  Skin tear of right elbow without complication, initial encounter    Rx / DC  Orders ED Discharge Orders     None         Ernie Avena, MD 06/09/22 1032

## 2022-07-19 NOTE — Progress Notes (Deleted)
Subjective:   Bill Price is a 88 y.o. male who presents for Medicare Annual/Subsequent preventive examination.  I connected with Bill Price today by telephone and verified that I am speaking with the correct person using two identifiers. Location patient: home Location provider: work Persons participating in the virtual visit: patient, Engineer, civil (consulting).    I discussed the limitations, risks, security and privacy concerns of performing an evaluation and management service by telephone and the availability of in person appointments. I also discussed with the patient that there may be a patient responsible charge related to this service. The patient expressed understanding and verbally consented to this telephonic visit.    Interactive audio and video telecommunications were attempted between this provider and patient, however failed, due to patient having technical difficulties OR patient did not have access to video capability.  We continued and completed visit with audio only.  Some vital signs may be absent or patient reported.   Time Spent with patient on telephone encounter: *** minutes   Review of Systems    ***       Objective:    There were no vitals filed for this visit. There is no height or weight on file to calculate BMI.     06/09/2022    9:12 AM 04/23/2021    8:08 AM 06/09/2020    6:20 PM 06/09/2020    1:13 PM 05/27/2020    9:27 AM 04/17/2020    8:09 AM 04/03/2019   11:03 AM  Advanced Directives  Does Patient Have a Medical Advance Directive? No No Yes No No Yes No  Type of Loss adjuster, chartered of [place];Living will   Does patient want to make changes to medical advance directive?   No - Patient declined      Copy of Healthcare Power of Attorney in Chart?      No - copy requested   Would patient like information on creating a medical advance directive?  Yes (MAU/Ambulatory/Procedural Areas - Information given)     No - Patient  declined    Current Medications (verified) Outpatient Encounter Medications as of 07/19/2022  Medication Sig   COVID-19 mRNA vaccine, Moderna, 100 MCG/0.5ML injection Inject into the muscle.   No facility-administered encounter medications on file as of 07/19/2022.    Allergies (verified) Augmentin [amoxicillin-pot clavulanate] and Crestor [rosuvastatin]   History: Past Medical History:  Diagnosis Date   Allergic rhinitis    Anxiety    Arthritis    Colon polyp    GERD (gastroesophageal reflux disease)    Hemorrhoids    Hyperlipidemia    IBS (irritable bowel syndrome)    Impotence    Kidney stone    Orthostatic hypotension    Osteopenia    PVD (peripheral vascular disease) (HCC)    Urinary obstruction    Past Surgical History:  Procedure Laterality Date   COLONOSCOPY  2007   HERNIA REPAIR  2005   TOTAL HIP ARTHROPLASTY Right 06/10/2020   Procedure: TOTAL HIP ARTHROPLASTY ANTERIOR APPROACH;  Surgeon: Samson Frederic, MD;  Location: [facility];  Service: Orthopedics;  Laterality: Right;   Family History  Problem Relation Age of Onset   Prostate cancer Brother    Colon cancer Neg Hx    Colon polyps Neg Hx    Diabetes Neg Hx    Kidney disease Neg Hx    Esophageal cancer Neg Hx    Gallbladder disease Neg Hx    Heart  disease Neg Hx    Social History   Socioeconomic History   Marital status: Widowed    Spouse name: Not on file   Number of children: 0   Years of education: Not on file   Highest education level: Not on file  Occupational History   Occupation: Retired  Tobacco Use   Smoking status: Never   Smokeless tobacco: Never  Vaping Use   Vaping Use: Never used  Substance and Sexual Activity   Alcohol use: Yes    Alcohol/week: 0.0 standard drinks of alcohol    Comment: 1 glass of wine a day   Drug use: No   Sexual activity: Never  Other Topics Concern   Not on file  Social History Narrative   Not on file   Social Determinants of Health   Financial  Resource Strain: Low Risk  (04/23/2021)   Overall Financial Resource Strain (CARDIA)    Difficulty of Paying Living Expenses: Not hard at all  Food Insecurity: No Food Insecurity (04/23/2021)   Hunger Vital Sign    Worried About Running Out of Food in the Last Year: Never true    Ran Out of Food in the Last Year: Never true  Transportation Needs: No Transportation Needs (04/23/2021)   PRAPARE - Administrator, Civil Service (Medical): No    Lack of Transportation (Non-Medical): No  Physical Activity: Inactive (04/23/2021)   Exercise Vital Sign    Days of Exercise per Week: 0 days    Minutes of Exercise per Session: 0 min  Stress: No Stress Concern Present (04/23/2021)   [facility] [employer]ational [employer] - Occupational Stress Questionnaire    Feeling of Stress : Not at all  Social Connections: Moderately Isolated (04/23/2021)   Social Connection and Isolation Panel [NHANES]    Frequency of Communication with Friends and Family: More than three times a week    Frequency of Social Gatherings with Friends and Family: More than three times a week    Attends Religious Services: Never    Database administrator or Organizations: No    Attends Banker Meetings: 1 to 4 times per year    Marital Status: Widowed    Tobacco Counseling Counseling given: Not Answered   Clinical Intake:                 Diabetic?No         Activities of Daily Living     No data to display          Patient Care Team: Copland, Gwenlyn Found, MD as PCP - General (Family Medicine)  Indicate any recent Medical Services you may have received from other than [facility] providers in the past year (date may be approximate).     Assessment:   This is a routine wellness examination for [name].  Hearing/Vision screen No results found.  Dietary issues and exercise activities discussed:     Goals Addressed   None    Depression Screen    06/29/2021    2:38 PM 04/23/2021     8:11 AM 04/17/2020    8:13 AM 04/03/2019   11:04 AM 03/30/2018   11:06 AM 03/29/2017   11:09 AM 10/09/2015    2:19 PM  PHQ 2/9 Scores  PHQ - 2 Score 0 0 0 0 0 0 0    Fall Risk    04/23/2021    8:10 AM 04/17/2020    8:12 AM 04/03/2019   11:04 AM 03/30/2018  11:06 AM 03/29/2017   11:09 AM  Fall Risk   Falls in the past year? 0 1 0 No No  Number falls in past yr: 0 1     Injury with Fall? 0 0     Risk for fall due to :  History of fall(s)     Follow up Falls prevention discussed Falls evaluation completed;Falls prevention discussed       FALL RISK PREVENTION PERTAINING TO THE HOME:  Any stairs in or around the home? {YES/NO:21197} If so, are there any without handrails? {YES/NO:21197} Home free of loose throw rugs in walkways, pet beds, electrical cords, etc? {YES/NO:21197} Adequate lighting in your home to reduce risk of falls? {YES/NO:21197}  ASSISTIVE DEVICES UTILIZED TO PREVENT FALLS:  Life alert? {YES/NO:21197} Use of a cane, walker or w/c? {YES/NO:21197} Grab bars in the bathroom? {YES/NO:21197} Shower chair or bench in shower? {YES/NO:21197} Elevated toilet seat or a handicapped toilet? {YES/NO:21197}  TIMED UP AND GO:  Was the test performed? No . Phone visit   Cognitive Function:    03/29/2017   11:10 AM  MMSE - Mini Mental State Exam  Orientation to time 5  Orientation to Place 5  Registration 3  Attention/ Calculation 4  Recall 2  Language- name 2 objects 2  Language- repeat 1  Language- follow 3 step command 3  Language- read & follow direction 1  Write a sentence 1  Copy design 0  Total score 27        04/17/2020    8:16 AM  6CIT Screen  What Year? 0 points  What month? 0 points  What time? 0 points  Count back from 20 0 points  Months in reverse 0 points  Repeat phrase 0 points  Total Score 0 points    Immunizations Immunization History  Administered Date(s) Administered   Influenza Split 08/26/2011, 08/08/2012   Influenza Whole  09/21/2007, 08/20/2009, 08/04/2010   Influenza, High Dose Seasonal PF 08/16/2016, 08/24/2017, 10/13/2018   Influenza,inj,Quad PF,6+ Mos 08/16/2013, 09/19/2014, 08/07/2015   Influenza-Unspecified 07/09/2020   Moderna SARS-COV2 Booster Vaccination 06/29/2021   Moderna Sars-Covid-2 Vaccination 01/23/2020, 02/20/2020, 09/18/2020   Pneumococcal Conjugate-13 10/24/2014   Pneumococcal Polysaccharide-23 08/31/2017   Tdap 03/09/2008, 06/09/2022    TDAP status: Up to date  Flu Vaccine status: Up to date  Pneumococcal vaccine status: Up to date  Covid-19 vaccine status: Information provided on how to obtain vaccines.   Qualifies for Shingles Vaccine? Yes   Zostavax completed No   Shingrix Completed?: No.    Education has been provided regarding the importance of this vaccine. Patient has been advised to call insurance company to determine out of pocket expense if they have not yet received this vaccine. Advised may also receive vaccine at local pharmacy or Health Dept. Verbalized acceptance and understanding.  Screening Tests Health Maintenance  Topic Date Due   Zoster Vaccines- Shingrix (1 of 2) Never done   COVID-19 Vaccine (4 - Moderna risk series) 08/24/2021   INFLUENZA VACCINE  06/08/2022   TETANUS/TDAP  06/09/2032   Pneumonia Vaccine 36+ Years old  Completed   HPV VACCINES  Aged Out    Health Maintenance  Health Maintenance Due  Topic Date Due   Zoster Vaccines- Shingrix (1 of 2) Never done   COVID-19 Vaccine (4 - Moderna risk series) 08/24/2021   INFLUENZA VACCINE  06/08/2022    Colorectal cancer screening: No longer required.   Lung Cancer Screening: (Low Dose CT Chest recommended if Age 29-80 years,  30 pack-year currently smoking OR have quit w/in 15years.) does not qualify.     Additional Screening:  Hepatitis C Screening: does not qualify  Vision Screening: Recommended annual ophthalmology exams for early detection of glaucoma and other disorders of the eye. Is  the patient up to date with their annual eye exam?  {YES/NO:21197} Who is the provider or what is the name of the office in which the patient attends annual eye exams? *** If pt is not established with a provider, would they like to be referred to a provider to establish care? {YES/NO:21197}.   Dental Screening: Recommended annual dental exams for proper oral hygiene  Community Resource Referral / Chronic Care Management: CRR required this visit?  {YES/NO:21197}  CCM required this visit?  {YES/NO:21197}     Plan:     I have personally reviewed and noted the following in the patient's chart:   Medical and social history Use of alcohol, tobacco or illicit drugs  Current medications and supplements including opioid prescriptions. {Opioid [phone]} Functional ability and status Nutritional status Physical activity Advanced directives List of other physicians Hospitalizations, surgeries, and ER visits in previous 12 months Vitals Screenings to include cognitive, depression, and falls Referrals and appointments  In addition, I have reviewed and discussed with patient certain preventive protocols, quality metrics, and best practice recommendations. A written personalized care plan for preventive services as well as general preventive health recommendations were provided to patient.   Due to this being a telephonic visit, the after visit summary with patients personalized plan was offered to patient via mail or my-chart. Patient would like to access on my-chart.   Roanna Raider, LPN   04/21/4314  Nurse Health Advisor  Nurse Notes: ***

## 2022-07-24 NOTE — Patient Instructions (Signed)
It was good to see you again today!   

## 2022-07-24 NOTE — Progress Notes (Addendum)
[facility] [address] [place], [place] [address] [phone]  Date:  07/28/2022   Name:  Bill Price   DOB:  [birth year]   MRN:  [id number]  PCP:  Pearline Cables, MD    Chief Complaint: Blood Pressure Check (Concerns/ questions: yes/Flu shot today: yes)   History of Present Illness:  Bill Price is a 88 y.o. very pleasant male patient who presents with the following:  Patient seen today for follow-up and to discuss blood pressure Most recent visit with myself just over a year ago  Bill Price lives alone since his wife died.  His closest contact is his granddaughter in [place]. He was seen in the ER last month after a fall-his leg gave out and he fell, sustaining some contusions and a skin tear.  Pt notes he had been doing a lot of walking and he just missed his chair when he went to sit down Wound care, tetanus provided at the ER  He notes his back may hurt sometimes but this eases off if he stretches   He is still living on his own, with his cat His grand-daughter checks in with him on a regular basis  He is driving, he goes to the store and shops, and he does all his housework on his own He may get some back pain off and on but tylenol helps  He is otherwise feeling well and has no concerns today  Wt Readings from Last 3 Encounters:  07/28/22 182 lb 9.6 oz (82.8 kg)  06/09/22 180 lb (81.6 kg)  06/29/21 182 lb (82.6 kg)    Flu shot- give today  COVID booster- he will do this fall  Can update labs today He is not currently taking any medications  Blood pressure is currently controlled with diet and lifestyle only As above, Recent blood pressure readings have been normal He has not noted significant symptoms of hypotension recently  BP Readings from Last 3 Encounters:  07/28/22 124/70  06/09/22 117/77  06/29/21 128/82     Patient Active Problem List   Diagnosis Date Noted   Closed  displaced fracture of right femoral neck (HCC) 06/11/2020   PVD (peripheral vascular disease) (HCC) 06/11/2020   Impotence of organic origin 01/28/2014   ACTINIC KERATOSIS, HEAD 10/05/2010   ANXIETY 06/04/2010   HYPOTENSION, ORTHOSTATIC 06/04/2010   IRRITABLE BOWEL SYNDROME 04/15/2010   ALLERGIC RHINITIS 09/10/2009   GERD 04/22/2009   ARTHRITIS, HIP 05/15/2008   OSTEOPENIA 01/17/2008   HYPERLIPIDEMIA 09/21/2007   RENAL CALCULUS, HX OF 09/21/2007    Past Medical History:  Diagnosis Date   Allergic rhinitis    Anxiety    Arthritis    Colon polyp    GERD (gastroesophageal reflux disease)    Hemorrhoids    Hyperlipidemia    IBS (irritable bowel syndrome)    Impotence    Kidney stone    Orthostatic hypotension    Osteopenia    PVD (peripheral vascular disease) (HCC)    Urinary obstruction     Past Surgical History:  Procedure Laterality Date   COLONOSCOPY  2007   HERNIA REPAIR  2005   TOTAL HIP ARTHROPLASTY Right 06/10/2020   Procedure: TOTAL HIP ARTHROPLASTY ANTERIOR APPROACH;  Surgeon: Samson Frederic, MD;  Location: [facility];  Service: Orthopedics;  Laterality: Right;    Social History   Tobacco Use   Smoking status: Never   Smokeless tobacco: Never  Vaping  Use   Vaping Use: Never used  Substance Use Topics   Alcohol use: Yes    Alcohol/week: 0.0 standard drinks of alcohol    Comment: 1 glass of wine a day   Drug use: No    Family History  Problem Relation Age of Onset   Prostate cancer Brother    Colon cancer Neg Hx    Colon polyps Neg Hx    Diabetes Neg Hx    Kidney disease Neg Hx    Esophageal cancer Neg Hx    Gallbladder disease Neg Hx    Heart disease Neg Hx     Allergies  Allergen Reactions   Augmentin [Amoxicillin-Pot Clavulanate] Diarrhea   Crestor [Rosuvastatin]     Shaky, hot flashes    Medication list has been reviewed and updated.  No current outpatient medications on file prior to visit.   No current facility-administered  medications on file prior to visit.    Review of Systems:  As per HPI- otherwise negative.   Physical Examination: Vitals:   07/28/22 0812  BP: 124/70  Pulse: 80  Resp: 18  Temp: 97.6 F (36.4 C)  SpO2: 97%   Vitals:   07/28/22 0812  Weight: 182 lb 9.6 oz (82.8 kg)  Height: 5\' 8"  (1.727 m)   Body mass index is 27.76 kg/m. Ideal Body Weight: Weight in (lb) to have BMI = 25: 164.1  GEN: no acute distress. He looks great for age.  Some HOH- he forgot his hearing aids HEENT: Atraumatic, Normocephalic.  Ears and Nose: No external deformity. CV: RRR, No M/G/R. No JVD. No thrill. No extra heart sounds. PULM: CTA B, no wheezes, crackles, rhonchi. No retractions. No resp. distress. No accessory muscle use. ABD: S, NT, ND, +BS. No rebound. No HSM. EXTR: No c/c/e PSYCH: Normally interactive. Conversant.    Assessment and Plan: Need for influenza vaccination - Plan: Flu Vaccine QUAD High Dose(Fluad)  Screening for deficiency anemia - Plan: CBC  Screening for diabetes mellitus - Plan: Comprehensive metabolic panel  HYPOTENSION, ORTHOSTATIC - Plan: CBC, Comprehensive metabolic panel  Pt seen today for follow-up He continues to do very well at his age, no concerns today Hypertension has been under okay control, he had 1 fall but he thinks this was due to his knee giving out Labs are pending as above Gave flu shot He will get his covid booster asap this fall  Recheck in 6 months assuming all is well He brings in a wooden decorative piece he made and painted himself today which is very kind  Signed , MD  Received labs as below, letter to patient  Results for orders placed or performed in visit on 07/28/22  CBC  Result Value Ref Range   WBC 5.5 4.0 - 10.5 K/uL   RBC 3.77 (L) 4.22 - 5.81 Mil/uL   Platelets 144.0 (L) 150.0 - 400.0 K/uL   Hemoglobin 12.4 (L) 13.0 - 17.0 g/dL   HCT 07/30/22 (L) 38.2 - 50.5 %   MCV 99.6 78.0 - 100.0 fl   MCHC 32.9 30.0 - 36.0  g/dL   RDW 39.7 67.3 - 41.9 %  Comprehensive metabolic panel  Result Value Ref Range   Sodium 138 135 - 145 mEq/L   Potassium 4.2 3.5 - 5.1 mEq/L   Chloride 104 96 - 112 mEq/L   CO2 27 19 - 32 mEq/L   Glucose, Bld 95 70 - 99 mg/dL   BUN 18 6 - 23 mg/dL   Creatinine,  Ser 1.06 0.40 - 1.50 mg/dL   Total Bilirubin 0.6 0.2 - 1.2 mg/dL   Alkaline Phosphatase 93 39 - 117 U/L   AST 17 0 - 37 U/L   ALT 10 0 - 53 U/L   Total Protein 6.7 6.0 - 8.3 g/dL   Albumin 3.6 3.5 - 5.2 g/dL   GFR 61.30 >60.00 mL/min   Calcium 9.2 8.4 - 10.5 mg/dL

## 2022-07-28 ENCOUNTER — Ambulatory Visit (INDEPENDENT_AMBULATORY_CARE_PROVIDER_SITE_OTHER): Payer: Medicare Other | Admitting: Family Medicine

## 2022-07-28 ENCOUNTER — Encounter: Payer: Self-pay | Admitting: Family Medicine

## 2022-07-28 VITALS — BP 124/70 | HR 80 | Temp 97.6°F | Resp 18 | Ht 68.0 in | Wt 182.6 lb

## 2022-07-28 DIAGNOSIS — Z131 Encounter for screening for diabetes mellitus: Secondary | ICD-10-CM

## 2022-07-28 DIAGNOSIS — Z130 Encounter for screening for diseases of the blood and blood-forming organs and certain disorders involving the immune mechanism: Secondary | ICD-10-CM

## 2022-07-28 DIAGNOSIS — Z23 Encounter for immunization: Secondary | ICD-10-CM

## 2022-07-28 DIAGNOSIS — I951 Orthostatic hypotension: Secondary | ICD-10-CM | POA: Diagnosis not present

## 2022-07-28 LAB — COMPREHENSIVE METABOLIC PANEL
ALT: 10 U/L (ref 0–53)
AST: 17 U/L (ref 0–37)
Albumin: 3.6 g/dL (ref 3.5–5.2)
Alkaline Phosphatase: 93 U/L (ref 39–117)
BUN: 18 mg/dL (ref 6–23)
CO2: 27 mEq/L (ref 19–32)
Calcium: 9.2 mg/dL (ref 8.4–10.5)
Chloride: 104 mEq/L (ref 96–112)
Creatinine, Ser: 1.06 mg/dL (ref 0.40–1.50)
GFR: 61.3 mL/min (ref >60.00)
Glucose, Bld: 95 mg/dL (ref 70–99)
Potassium: 4.2 mEq/L (ref 3.5–5.1)
Sodium: 138 mEq/L (ref 135–145)
Total Bilirubin: 0.6 mg/dL (ref 0.2–1.2)
Total Protein: 6.7 g/dL (ref 6.0–8.3)

## 2022-07-28 LAB — CBC
HCT: 37.6 % — ABNORMAL LOW (ref 39.0–52.0)
Hemoglobin: 12.4 g/dL — ABNORMAL LOW (ref 13.0–17.0)
MCHC: 32.9 g/dL (ref 30.0–36.0)
MCV: 99.6 fl (ref 78.0–100.0)
Platelets: 144 10*3/uL — ABNORMAL LOW (ref 150.0–400.0)
RBC: 3.77 Mil/uL — ABNORMAL LOW (ref 4.22–5.81)
RDW: 13.5 % (ref 11.5–15.5)
WBC: 5.5 10*3/uL (ref 4.0–10.5)

## 2022-10-20 IMAGING — CR DG HIP (WITH OR WITHOUT PELVIS) 2-3V*L*
3 series · 3 of 3 positions shown · non-contrast
Comparison: 06/09/2020

CLINICAL DATA: Two weeks hip pain

EXAM:
DG HIP (WITH OR WITHOUT PELVIS) 2-3V LEFT

[t pelvis a.p.]
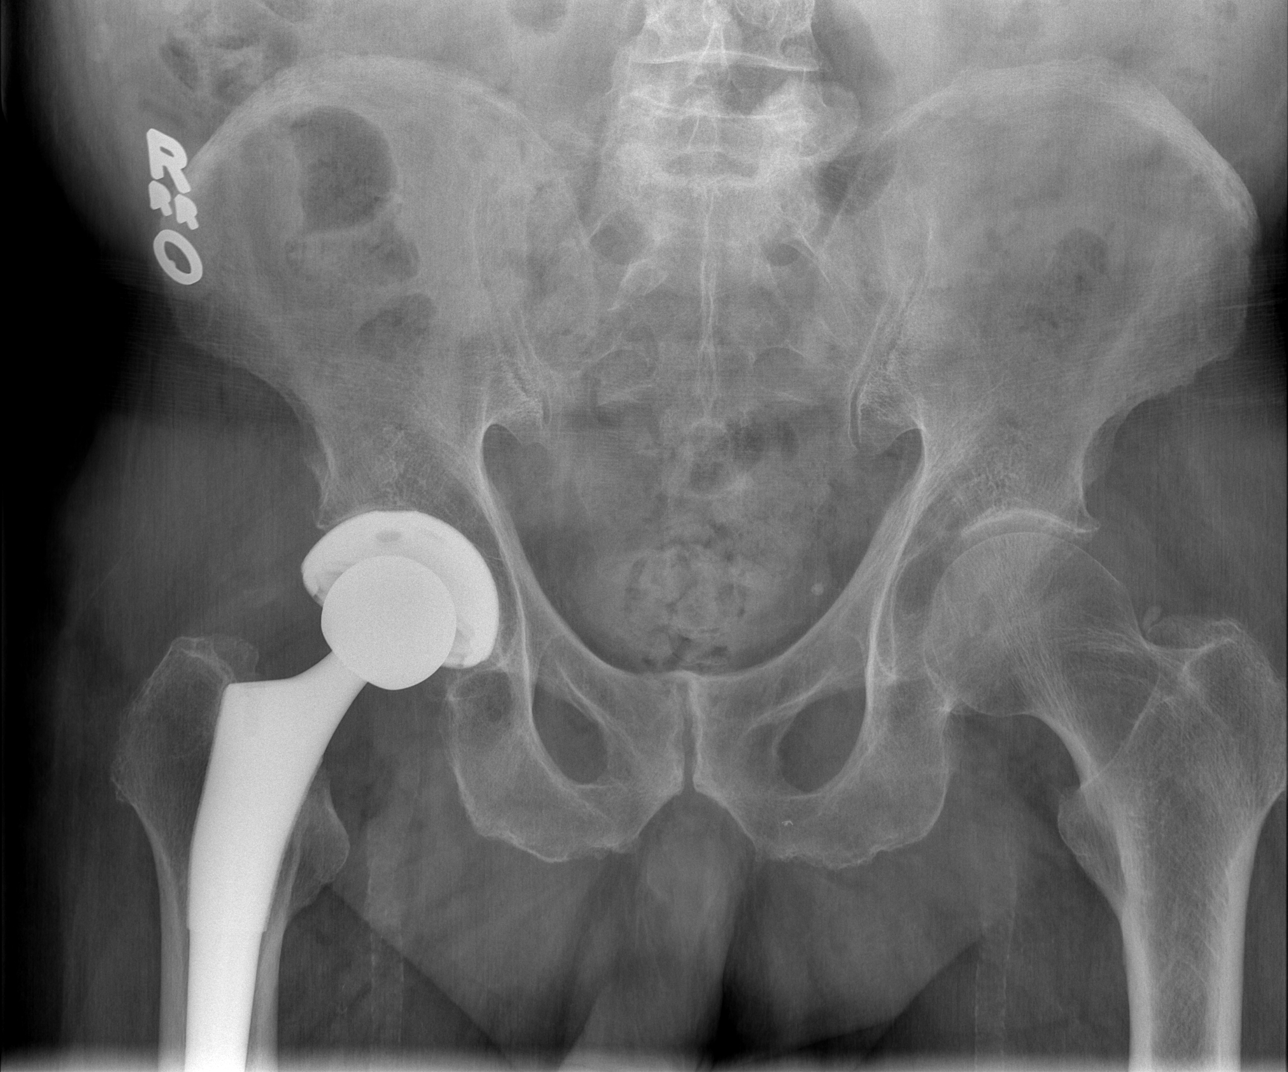

[t hip ap left]
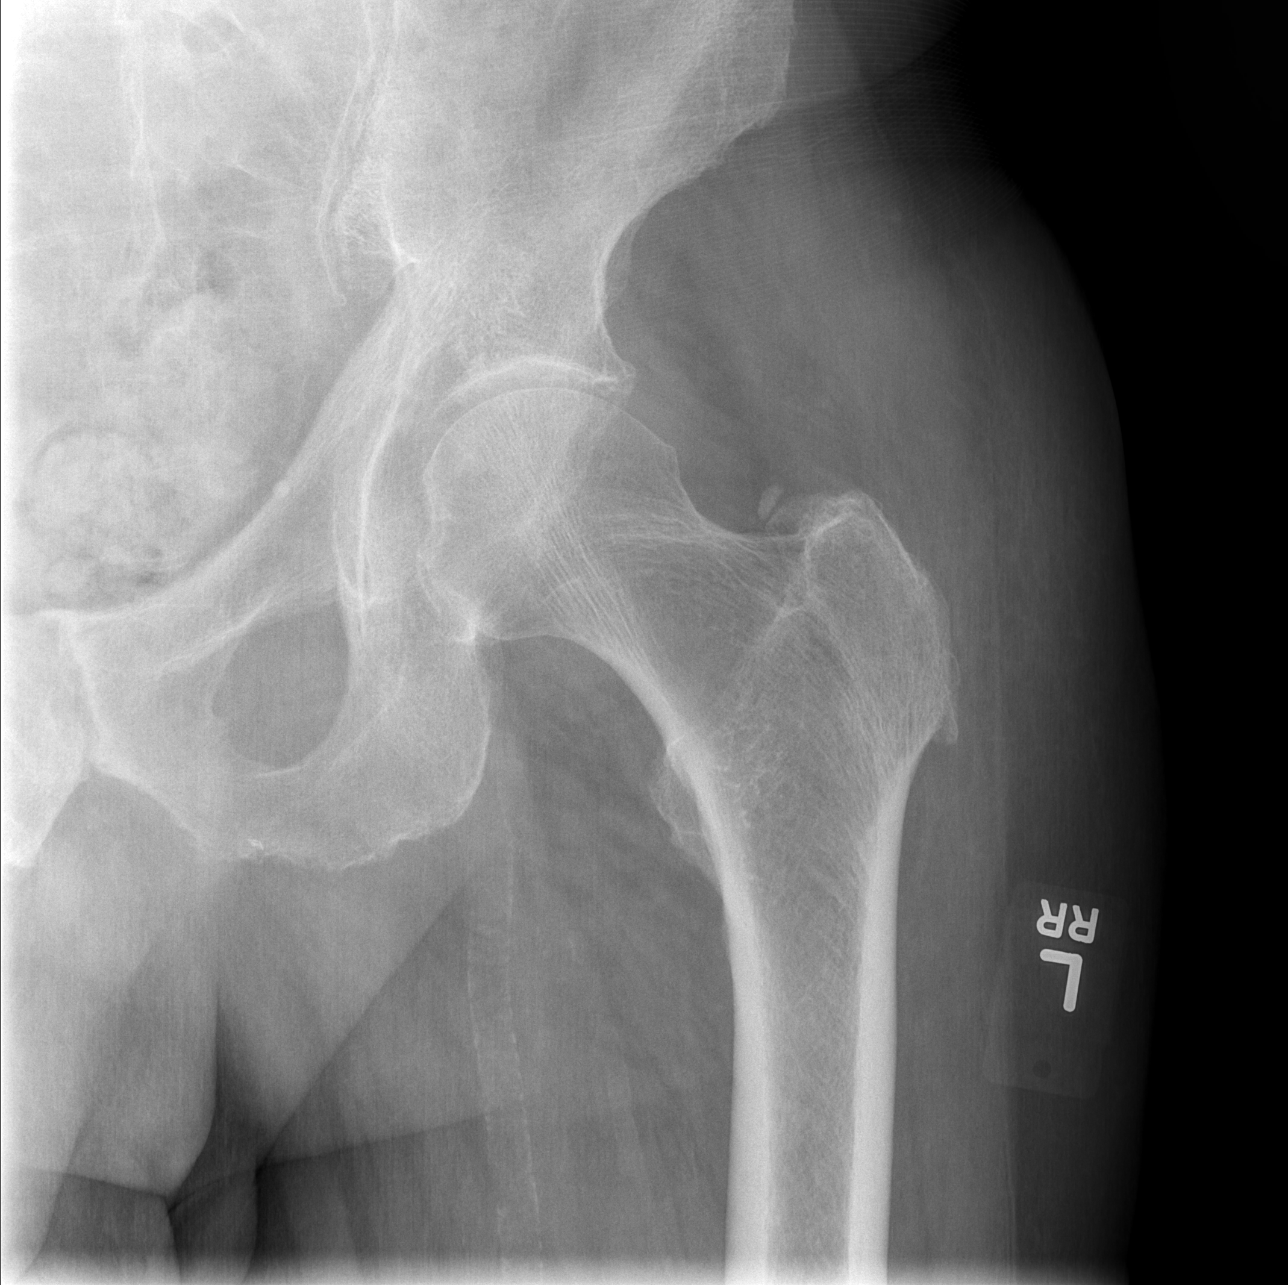

[t hip frog leg left]
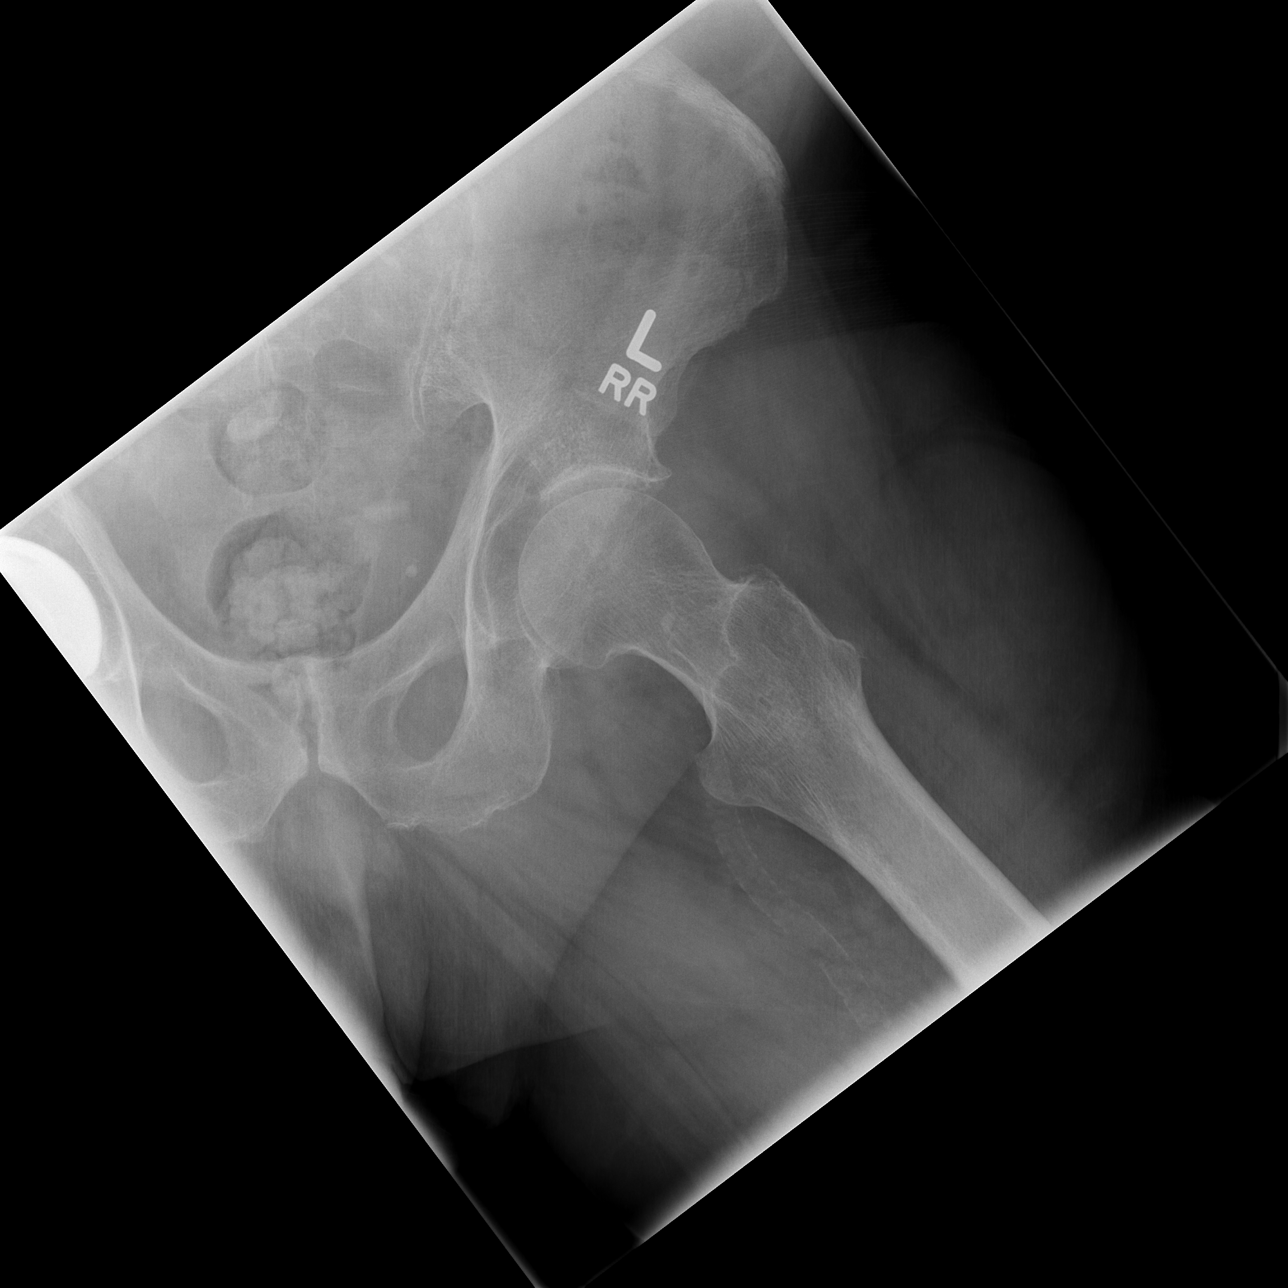

[3 of 3 positions shown; findings below may reference images not displayed]

FINDINGS: Right hip replacement in satisfactory position alignment.

Normal left hip joint.  Femoral head normal.

Soft tissue calcification above the greater trochanter on the left
compatible with calcific tendinitis

Mild arterial calcification.
IMPRESSION: Calcific tendinitis on the left.  No acute skeletal abnormality.

## 2022-11-24 ENCOUNTER — Ambulatory Visit (INDEPENDENT_AMBULATORY_CARE_PROVIDER_SITE_OTHER): Payer: Medicare Other

## 2022-11-24 VITALS — Wt 182.0 lb

## 2022-11-24 DIAGNOSIS — Z0000 Encounter for general adult medical examination without abnormal findings: Secondary | ICD-10-CM

## 2022-11-24 NOTE — Progress Notes (Signed)
I connected with  Bill Price on 11/24/22 by a audio enabled telemedicine application and verified that I am speaking with the correct person using two identifiers.  Patient Location: Home  Provider Location: Office/Clinic  I discussed the limitations of evaluation and management by telemedicine. The patient expressed understanding and agreed to proceed.   Subjective:   Bill Price is a 89 y.o. male who presents for Medicare Annual/Subsequent preventive examination.  Review of Systems     Cardiac Risk Factors include: advanced age (>10men, >64 women)     Objective:    Today's Vitals   11/24/22 0847  Weight: 182 lb (82.6 kg)   Body mass index is 27.67 kg/m.     11/24/2022    8:52 AM 06/09/2022    9:12 AM 04/23/2021    8:08 AM 06/09/2020    6:20 PM 06/09/2020    1:13 PM 05/27/2020    9:27 AM 04/17/2020    8:09 AM  Advanced Directives  Does Patient Have a Medical Advance Directive? No No No Yes No No Yes  Type of Insurance claims handler of [place];Living will  Does patient want to make changes to medical advance directive?    No - Patient declined     Copy of [place] in Chart?       No - copy requested  Would patient like information on creating a medical advance directive? No - Patient declined  Yes (MAU/Ambulatory/Procedural Areas - Information given)        Current Medications (verified) No outpatient encounter medications on file as of 11/24/2022.   No facility-administered encounter medications on file as of 11/24/2022.    Allergies (verified) Augmentin [amoxicillin-pot clavulanate] and Crestor [rosuvastatin]   History: Past Medical History:  Diagnosis Date   Allergic rhinitis    Anxiety    Arthritis    Colon polyp    GERD (gastroesophageal reflux disease)    Hemorrhoids    Hyperlipidemia    IBS (irritable bowel syndrome)    Impotence    Kidney stone    Orthostatic hypotension     Osteopenia    PVD (peripheral vascular disease) ([place])    Urinary obstruction    Past Surgical History:  Procedure Laterality Date   COLONOSCOPY  2007   HERNIA REPAIR  2005   TOTAL HIP ARTHROPLASTY Right 06/10/2020   Procedure: TOTAL HIP ARTHROPLASTY ANTERIOR APPROACH;  Surgeon: Rod Can, MD;  Location: [facility];  Service: Orthopedics;  Laterality: Right;   Family History  Problem Relation Age of Onset   Prostate cancer Brother    Colon cancer Neg Hx    Colon polyps Neg Hx    Diabetes Neg Hx    Kidney disease Neg Hx    Esophageal cancer Neg Hx    Gallbladder disease Neg Hx    Heart disease Neg Hx    Social History   Socioeconomic History   Marital status: Widowed    Spouse name: Not on file   Number of children: 0   Years of education: Not on file   Highest education level: Not on file  Occupational History   Occupation: Retired  Tobacco Use   Smoking status: Never   Smokeless tobacco: Never  Vaping Use   Vaping Use: Never used  Substance and Sexual Activity   Alcohol use: Yes    Alcohol/week: 0.0 standard drinks of alcohol    Comment: 1 glass of wine  a day   Drug use: No   Sexual activity: Never  Other Topics Concern   Not on file  Social History Narrative   Not on file   Social Determinants of Health   Financial Resource Strain: Low Risk  (11/24/2022)   Overall Financial Resource Strain (CARDIA)    Difficulty of Paying Living Expenses: Not hard at all  Food Insecurity: No Food Insecurity (11/24/2022)   Hunger Vital Sign    Worried About Running Out of Food in the Last Year: Never true    Ran Out of Food in the Last Year: Never true  Transportation Needs: No Transportation Needs (11/24/2022)   PRAPARE - Administrator, Civil Service (Medical): No    Lack of Transportation (Non-Medical): No  Physical Activity: Insufficiently Active (11/24/2022)   Exercise Vital Sign    Days of Exercise per Week: 2 days    Minutes of Exercise per Session: 10 min   Stress: No Stress Concern Present (11/24/2022)   [employer] of Occupational Health - Occupational Stress Questionnaire    Feeling of Stress : Not at all  Social Connections: Socially Isolated (11/24/2022)   Social Connection and Isolation Panel [NHANES]    Frequency of Communication with Friends and Family: More than three times a week    Frequency of Social Gatherings with Friends and Family: More than three times a week    Attends Religious Services: Never    Database administrator or Organizations: No    Attends Banker Meetings: Never    Marital Status: Widowed    Tobacco Counseling Counseling given: Not Answered   Clinical Intake:  Pre-visit preparation completed: Yes  Pain : No/denies pain     BMI - recorded: 27.67 Nutritional Status: BMI 25 -29 Overweight Nutritional Risks: None Diabetes: No  How often do you need to have someone help you when you read instructions, pamphlets, or other written materials from your doctor or pharmacy?: 1 - Never  Diabetic?no  Interpreter Needed?: No  Information entered by :: Lanier Ensign, LPN   Activities of Daily Living    11/24/2022    8:53 AM  In your present state of health, do you have any difficulty performing the following activities:  Hearing? 1  Vision? 0  Difficulty concentrating or making decisions? 0  Walking or climbing stairs? 0  Dressing or bathing? 0  Doing errands, shopping? 0  Preparing Food and eating ? N  Using the Toilet? N  In the past six months, have you accidently leaked urine? N  Do you have problems with loss of bowel control? N  Managing your Medications? N  Managing your Finances? N  Housekeeping or managing your Housekeeping? N    Patient Care Team: Copland, Gwenlyn Found, MD as PCP - General (Family Medicine)  Indicate any recent Medical Services you may have received from other than [facility] providers in the past year (date may be approximate).     Assessment:   This  is a routine wellness examination for [place].  Hearing/Vision screen Hearing Screening - Comments:: Pt wears a hearing aid  Vision Screening - Comments:: Pt follows up with Dr Dione Booze or [place]   Dietary issues and exercise activities discussed: Current Exercise Habits: Home exercise routine, Type of exercise: Other - see comments (stationary bike), Time (Minutes): 10, Frequency (Times/Week): 2, Weekly Exercise (Minutes/Week): 20   Goals Addressed             This Visit's Progress  Patient Stated       None at this time        Depression Screen    11/24/2022    8:51 AM 07/28/2022    8:15 AM 06/29/2021    2:38 PM 04/23/2021    8:11 AM 04/17/2020    8:13 AM 04/03/2019   11:04 AM 03/30/2018   11:06 AM  PHQ 2/9 Scores  PHQ - 2 Score 0 0 0 0 0 0 0    Fall Risk    11/24/2022    8:52 AM 07/28/2022    8:14 AM 04/23/2021    8:10 AM 04/17/2020    8:12 AM 04/03/2019   11:04 AM  Fall Risk   Falls in the past year? 1 1 0 1 0  Number falls in past yr: 1 0 0 1   Injury with Fall? 1 0 0 0   Comment skinned right arm      Risk for fall due to : Impaired vision;Impaired balance/gait   History of fall(s)   Follow up Falls prevention discussed Falls evaluation completed Falls prevention discussed Falls evaluation completed;Falls prevention discussed     FALL RISK PREVENTION PERTAINING TO THE HOME:  Any stairs in or around the home? Yes  If so, are there any without handrails? No  Home free of loose throw rugs in walkways, pet beds, electrical cords, etc? Yes  Adequate lighting in your home to reduce risk of falls? Yes   ASSISTIVE DEVICES UTILIZED TO PREVENT FALLS:  Life alert? No  Use of a cane, walker or w/c? No  Grab bars in the bathroom? No  Shower chair or bench in shower? No  Elevated toilet seat or a handicapped toilet? No   TIMED UP AND GO:  Was the test performed? No .   Cognitive Function:    03/29/2017   11:10 AM  MMSE - Mini Mental State Exam  Orientation to time 5   Orientation to Place 5  Registration 3  Attention/ Calculation 4  Recall 2  Language- name 2 objects 2  Language- repeat 1  Language- follow 3 step command 3  Language- read & follow direction 1  Write a sentence 1  Copy design 0  Total score 27        11/24/2022    8:54 AM 04/17/2020    8:16 AM  6CIT Screen  What Year? 0 points 0 points  What month? 0 points 0 points  What time? 0 points 0 points  Count back from 20 0 points 0 points  Months in reverse 0 points 0 points  Repeat phrase 0 points 0 points  Total Score 0 points 0 points    Immunizations Immunization History  Administered Date(s) Administered   Fluad Quad(high Dose 65+) 07/28/2022   Influenza Split 08/26/2011, 08/08/2012   Influenza Whole 09/21/2007, 08/20/2009, 08/04/2010   Influenza, High Dose Seasonal PF 08/16/2016, 08/24/2017, 10/13/2018   Influenza,inj,Quad PF,6+ Mos 08/16/2013, 09/19/2014, 08/07/2015   Influenza-Unspecified 07/09/2020   Moderna SARS-COV2 Booster Vaccination 06/29/2021   Moderna Sars-Covid-2 Vaccination 01/23/2020, 02/20/2020, 09/18/2020   Pneumococcal Conjugate-13 10/24/2014   Pneumococcal Polysaccharide-23 08/31/2017   Tdap 03/09/2008, 06/09/2022    TDAP status: Up to date  Flu Vaccine status: Up to date  Pneumococcal vaccine status: Up to date  Covid-19 vaccine status: Completed vaccines  Qualifies for Shingles Vaccine? Yes   Zostavax completed No   Shingrix Completed?: No.    Education has been provided regarding the importance of this vaccine. Patient has  been advised to call insurance company to determine out of pocket expense if they have not yet received this vaccine. Advised may also receive vaccine at local pharmacy or Health Dept. Verbalized acceptance and understanding.  Screening Tests Health Maintenance  Topic Date Due   Zoster Vaccines- Shingrix (1 of 2) Never done   COVID-19 Vaccine (4 - 2023-24 season) 07/09/2022   Medicare Annual Wellness (AWV)   11/25/2023   DTaP/Tdap/Td (3 - Td or Tdap) 06/09/2032   Pneumonia Vaccine 2+ Years old  Completed   INFLUENZA VACCINE  Completed   HPV VACCINES  Aged Out    Health Maintenance  Health Maintenance Due  Topic Date Due   Zoster Vaccines- Shingrix (1 of 2) Never done   COVID-19 Vaccine (4 - 2023-24 season) 07/09/2022    Colorectal cancer screening: No longer required.    Additional Screening:   Vision Screening: Recommended annual ophthalmology exams for early detection of glaucoma and other disorders of the eye. Is the patient up to date with their annual eye exam?  Yes  Who is the provider or what is the name of the office in which the patient attends annual eye exams[facility]  If pt is not established with a provider, would they like to be referred to a provider to establish care? No .   Dental Screening: Recommended annual dental exams for proper oral hygiene  Community Resource Referral / Chronic Care Management: CRR required this visit?  No   CCM required this visit?  No      Plan:     I have personally reviewed and noted the following in the patient's chart:   Medical and social history Use of alcohol, tobacco or illicit drugs  Current medications and supplements including opioid prescriptions. Patient is not currently taking opioid prescriptions. Functional ability and status Nutritional status Physical activity Advanced directives List of other physicians Hospitalizations, surgeries, and ER visits in previous 12 months Vitals Screenings to include cognitive, depression, and falls Referrals and appointments  In addition, I have reviewed and discussed with patient certain preventive protocols, quality metrics, and best practice recommendations. A written personalized care plan for preventive services as well as general preventive health recommendations were provided to patient.     Willette Brace, LPN   1/61/0960   Nurse Notes: none

## 2022-11-24 NOTE — Patient Instructions (Signed)
Bill Price , Thank you for taking time to come for your Medicare Wellness Visit. I appreciate your ongoing commitment to your health goals. Please review the following plan we discussed and let me know if I can assist you in the future.   These are the goals we discussed:  Goals       maintain current healthy lifestyle. (pt-stated)      Maintain independence      Patient Stated      None at this time         This is a list of the screening recommended for you and due dates:  Health Maintenance  Topic Date Due   Zoster (Shingles) Vaccine (1 of 2) Never done   COVID-19 Vaccine (4 - 2023-24 season) 07/09/2022   Medicare Annual Wellness Visit  11/25/2023   DTaP/Tdap/Td vaccine (3 - Td or Tdap) 06/09/2032   Pneumonia Vaccine  Completed   Flu Shot  Completed   HPV Vaccine  Aged Out    Advanced directives: Advance directive discussed with you today. Even though you declined this today please call our office should you change your mind and we can give you the proper paperwork for you to fill out.  Conditions/risks identified: none at this time   Next appointment: Follow up in one year for your annual wellness visit.   Preventive Care 3 Years and Older, Male  Preventive care refers to lifestyle choices and visits with your health care provider that can promote health and wellness. What does preventive care include? A yearly physical exam. This is also called an annual well check. Dental exams once or twice a year. Routine eye exams. Ask your health care provider how often you should have your eyes checked. Personal lifestyle choices, including: Daily care of your teeth and gums. Regular physical activity. Eating a healthy diet. Avoiding tobacco and drug use. Limiting alcohol use. Practicing safe sex. Taking low doses of aspirin every day. Taking vitamin and mineral supplements as recommended by your health care provider. What happens during an annual well check? The services  and screenings done by your health care provider during your annual well check will depend on your age, overall health, lifestyle risk factors, and family history of disease. Counseling  Your health care provider may ask you questions about your: Alcohol use. Tobacco use. Drug use. Emotional well-being. Home and relationship well-being. Sexual activity. Eating habits. History of falls. Memory and ability to understand (cognition). Work and work Statistician. Screening  You may have the following tests or measurements: Height, weight, and BMI. Blood pressure. Lipid and cholesterol levels. These may be checked every 5 years, or more frequently if you are over 41 years old. Skin check. Lung cancer screening. You may have this screening every year starting at age 38 if you have a 30-pack-year history of smoking and currently smoke or have quit within the past 15 years. Fecal occult blood test (FOBT) of the stool. You may have this test every year starting at age 59. Flexible sigmoidoscopy or colonoscopy. You may have a sigmoidoscopy every 5 years or a colonoscopy every 10 years starting at age 71. Prostate cancer screening. Recommendations will vary depending on your family history and other risks. Hepatitis C blood test. Hepatitis B blood test. Sexually transmitted disease (STD) testing. Diabetes screening. This is done by checking your blood sugar (glucose) after you have not eaten for a while (fasting). You may have this done every 1-3 years. Abdominal aortic aneurysm (AAA) screening.  You may need this if you are a current or former smoker. Osteoporosis. You may be screened starting at age 76 if you are at high risk. Talk with your health care provider about your test results, treatment options, and if necessary, the need for more tests. Vaccines  Your health care provider may recommend certain vaccines, such as: Influenza vaccine. This is recommended every year. Tetanus, diphtheria,  and acellular pertussis (Tdap, Td) vaccine. You may need a Td booster every 10 years. Zoster vaccine. You may need this after age 28. Pneumococcal 13-valent conjugate (PCV13) vaccine. One dose is recommended after age 30. Pneumococcal polysaccharide (PPSV23) vaccine. One dose is recommended after age 2. Talk to your health care provider about which screenings and vaccines you need and how often you need them. This information is not intended to replace advice given to you by your health care provider. Make sure you discuss any questions you have with your health care provider. Document Released: 11/21/2015 Document Revised: 07/14/2016 Document Reviewed: 08/26/2015 Elsevier Interactive Patient Education  2017 [place] Prevention in the Home Falls can cause injuries. They can happen to people of all ages. There are many things you can do to make your home safe and to help prevent falls. What can I do on the outside of my home? Regularly fix the edges of walkways and driveways and fix any cracks. Remove anything that might make you trip as you walk through a door, such as a raised step or threshold. Trim any bushes or trees on the path to your home. Use bright outdoor lighting. Clear any walking paths of anything that might make someone trip, such as rocks or tools. Regularly check to see if handrails are loose or broken. Make sure that both sides of any steps have handrails. Any raised decks and porches should have guardrails on the edges. Have any leaves, snow, or ice cleared regularly. Use sand or salt on walking paths during winter. Clean up any spills in your garage right away. This includes oil or grease spills. What can I do in the bathroom? Use night lights. Install grab bars by the toilet and in the tub and shower. Do not use towel bars as grab bars. Use non-skid mats or decals in the tub or shower. If you need to sit down in the shower, use a plastic, non-slip  stool. Keep the floor dry. Clean up any water that spills on the floor as soon as it happens. Remove soap buildup in the tub or shower regularly. Attach bath mats securely with double-sided non-slip rug tape. Do not have throw rugs and other things on the floor that can make you trip. What can I do in the bedroom? Use night lights. Make sure that you have a light by your bed that is easy to reach. Do not use any sheets or blankets that are too big for your bed. They should not hang down onto the floor. Have a firm chair that has side arms. You can use this for support while you get dressed. Do not have throw rugs and other things on the floor that can make you trip. What can I do in the kitchen? Clean up any spills right away. Avoid walking on wet floors. Keep items that you use a lot in easy-to-reach places. If you need to reach something above you, use a strong step stool that has a grab bar. Keep electrical cords out of the way. Do not use floor polish or wax  that makes floors slippery. If you must use wax, use non-skid floor wax. Do not have throw rugs and other things on the floor that can make you trip. What can I do with my stairs? Do not leave any items on the stairs. Make sure that there are handrails on both sides of the stairs and use them. Fix handrails that are broken or loose. Make sure that handrails are as long as the stairways. Check any carpeting to make sure that it is firmly attached to the stairs. Fix any carpet that is loose or worn. Avoid having throw rugs at the top or bottom of the stairs. If you do have throw rugs, attach them to the floor with carpet tape. Make sure that you have a light switch at the top of the stairs and the bottom of the stairs. If you do not have them, ask someone to add them for you. What else can I do to help prevent falls? Wear shoes that: Do not have high heels. Have rubber bottoms. Are comfortable and fit you well. Are closed at the  toe. Do not wear sandals. If you use a stepladder: Make sure that it is fully opened. Do not climb a closed stepladder. Make sure that both sides of the stepladder are locked into place. Ask someone to hold it for you, if possible. Clearly mark and make sure that you can see: Any grab bars or handrails. First and last steps. Where the edge of each step is. Use tools that help you move around (mobility aids) if they are needed. These include: Canes. Walkers. Scooters. Crutches. Turn on the lights when you go into a dark area. Replace any light bulbs as soon as they burn out. Set up your furniture so you have a clear path. Avoid moving your furniture around. If any of your floors are uneven, fix them. If there are any pets around you, be aware of where they are. Review your medicines with your doctor. Some medicines can make you feel dizzy. This can increase your chance of falling. Ask your doctor what other things that you can do to help prevent falls. This information is not intended to replace advice given to you by your health care provider. Make sure you discuss any questions you have with your health care provider. Document Released: 08/21/2009 Document Revised: 04/01/2016 Document Reviewed: 11/29/2014 Elsevier Interactive Patient Education  2017 [employer].

## 2023-01-20 NOTE — Progress Notes (Addendum)
[place] at [facility], [place], [place] [id number] [phone]  Date:  01/26/2023   Name:  Bill Price   DOB:  1929-02-23   MRN:  [id number]  PCP:  Darreld Mclean, MD    Chief Complaint: 6 month follow up (Concerns/ questions: back issues )   History of Present Illness:  Bill Price is a 89 y.o. very pleasant male patient who presents with the following:  Patient seen today for periodic follow-up Most recent visit with myself was in September From our most recent visit:  He is still living on his own, with his cat "miss Perrin Smack" His grand-daughter checks in with him on a regular basis -she is in [place] He is driving, he goes to the store and shops, and he does all his housework on his own He may get some back pain off and on but tylenol helps  He is otherwise feeling well and has no concerns today  History of diet-controlled hypertension Not currently taking any medications Lab work in September showed mild anemia  He notes he had a cold recently but is now better He has some mild intermittent back pain for several months- however he notes he is able to clear it up by bending backwards He may take Tylenol on occasion if pain is more severe He continues to take care of his own affairs independently - his granddaughter does check in with him on a regular basis   Patient Active Problem List   Diagnosis Date Noted   Closed displaced fracture of right femoral neck ([place]) 06/11/2020   PVD (peripheral vascular disease) ([place]) 06/11/2020   Impotence of organic origin 01/28/2014   ACTINIC KERATOSIS, HEAD 10/05/2010   ANXIETY 06/04/2010   HYPOTENSION, ORTHOSTATIC 06/04/2010   IRRITABLE BOWEL SYNDROME 04/15/2010   ALLERGIC RHINITIS 09/10/2009   GERD 04/22/2009   ARTHRITIS, HIP 05/15/2008   OSTEOPENIA 01/17/2008   HYPERLIPIDEMIA 09/21/2007   RENAL CALCULUS, HX OF 09/21/2007    Past Medical History:   Diagnosis Date   Allergic rhinitis    Anxiety    Arthritis    Colon polyp    GERD (gastroesophageal reflux disease)    Hemorrhoids    Hyperlipidemia    IBS (irritable bowel syndrome)    Impotence    Kidney stone    Orthostatic hypotension    Osteopenia    PVD (peripheral vascular disease) ([place])    Urinary obstruction     Past Surgical History:  Procedure Laterality Date   COLONOSCOPY  2007   HERNIA REPAIR  2005   TOTAL HIP ARTHROPLASTY Right 06/10/2020   Procedure: TOTAL HIP ARTHROPLASTY ANTERIOR APPROACH;  Surgeon: Rod Can, MD;  Location: [facility];  Service: Orthopedics;  Laterality: Right;    Social History   Tobacco Use   Smoking status: Never   Smokeless tobacco: Never  Vaping Use   Vaping Use: Never used  Substance Use Topics   Alcohol use: Yes    Alcohol/week: 0.0 standard drinks of alcohol    Comment: 1 glass of wine a day   Drug use: No    Family History  Problem Relation Age of Onset   Prostate cancer Brother    Colon cancer Neg Hx    Colon polyps Neg Hx    Diabetes Neg Hx    Kidney disease Neg Hx    Esophageal cancer Neg Hx    Gallbladder disease Neg Hx  Heart disease Neg Hx     Allergies  Allergen Reactions   Augmentin [Amoxicillin-Pot Clavulanate] Diarrhea   Crestor [Rosuvastatin]     Shaky, hot flashes    Medication list has been reviewed and updated.  No current outpatient medications on file prior to visit.   No current facility-administered medications on file prior to visit.    Review of Systems:  As per HPI- otherwise negative.   Physical Examination: Vitals:   01/26/23 0901  BP: 112/72  Pulse: 84  Resp: 18  Temp: 97.8 F (36.6 C)  SpO2: 97%   Vitals:   01/26/23 0901  Weight: 174 lb 6.4 oz (79.1 kg)  Height: 5\' 8"  (1.727 m)   Body mass index is 26.52 kg/m. Ideal Body Weight: Weight in (lb) to have BMI = 25: 164.1  GEN: no acute distress.  Looks well, normal weight for age 60: Atraumatic,  Normocephalic.  Somewhat hard of hearing, wearing hearing aids bilaterally Ears and Nose: No external deformity. CV: RRR, No M/G/R. No JVD. No thrill. No extra heart sounds. PULM: CTA B, no wheezes, crackles, rhonchi. No retractions. No resp. distress. No accessory muscle use. ABD: S, NT, ND, +BS. No rebound. No HSM. EXTR: No c/c/e PSYCH: Normally interactive. Conversant.  Patient ambulates well, he is able to climb step onto exam table unassisted No bony tenderness, but he endorses mild tenderness of the right lumbar paraspinous muscles. Assessment and Plan: Chronic right-sided low back pain without sciatica - Plan: DG Lumbar Spine Complete  Essential hypertension - Plan: CBC, Comprehensive metabolic panel  Blood pressure is well-controlled with lifestyle only Patient notes lower back pain, will obtain x-rays to evaluate degree of arthritis.  Advised patient okay to take Tylenol as needed Will plan further follow- up pending labs and x-ray reports.   Signed Lamar Blinks, MD  Received labs as below, letter to patient  Results for orders placed or performed in visit on 01/26/23  CBC  Result Value Ref Range   WBC 7.6 4.0 - 10.5 K/uL   RBC 3.77 (L) 4.22 - 5.81 Mil/uL   Platelets 162.0 150.0 - 400.0 K/uL   Hemoglobin 12.5 (L) 13.0 - 17.0 g/dL   HCT 37.4 (L) 39.0 - 52.0 %   MCV 99.0 78.0 - 100.0 fl   MCHC 33.4 30.0 - 36.0 g/dL   RDW 13.2 11.5 - 15.5 %  Comprehensive metabolic panel  Result Value Ref Range   Sodium 139 135 - 145 mEq/L   Potassium 4.6 3.5 - 5.1 mEq/L   Chloride 103 96 - 112 mEq/L   CO2 28 19 - 32 mEq/L   Glucose, Bld 123 (H) 70 - 99 mg/dL   BUN 12 6 - 23 mg/dL   Creatinine, Ser 0.96 0.40 - 1.50 mg/dL   Total Bilirubin 0.7 0.2 - 1.2 mg/dL   Alkaline Phosphatase 72 39 - 117 U/L   AST 18 0 - 37 U/L   ALT 9 0 - 53 U/L   Total Protein 6.7 6.0 - 8.3 g/dL   Albumin 3.6 3.5 - 5.2 g/dL   GFR 68.80 >60.00 mL/min   Calcium 9.0 8.4 - 10.5 mg/dL

## 2023-01-20 NOTE — Patient Instructions (Addendum)
It was good to see you again today, assuming all is well please see me in about 6 months Please stop by the lab, and then visit "Imaging Services" on the ground floor to have x-rays done of your back  Take care!

## 2023-01-26 ENCOUNTER — Ambulatory Visit (INDEPENDENT_AMBULATORY_CARE_PROVIDER_SITE_OTHER): Payer: Medicare Other | Admitting: Family Medicine

## 2023-01-26 ENCOUNTER — Ambulatory Visit (HOSPITAL_BASED_OUTPATIENT_CLINIC_OR_DEPARTMENT_OTHER)
Admission: RE | Admit: 2023-01-26 | Discharge: 2023-01-26 | Disposition: A | Discharge status: Home / Self Care | Payer: Medicare Other | Source: Ambulatory Visit | Attending: Family Medicine | Admitting: Family Medicine

## 2023-01-26 VITALS — BP 112/72 | HR 84 | Temp 97.8°F | Resp 18 | Ht 68.0 in | Wt 174.4 lb

## 2023-01-26 DIAGNOSIS — G8929 Other chronic pain: Secondary | ICD-10-CM | POA: Insufficient documentation

## 2023-01-26 DIAGNOSIS — I10 Essential (primary) hypertension: Secondary | ICD-10-CM

## 2023-01-26 DIAGNOSIS — M545 Low back pain, unspecified: Secondary | ICD-10-CM

## 2023-01-26 LAB — COMPREHENSIVE METABOLIC PANEL
ALT: 9 U/L (ref 0–53)
AST: 18 U/L (ref 0–37)
Albumin: 3.6 g/dL (ref 3.5–5.2)
Alkaline Phosphatase: 72 U/L (ref 39–117)
BUN: 12 mg/dL (ref 6–23)
CO2: 28 mEq/L (ref 19–32)
Calcium: 9 mg/dL (ref 8.4–10.5)
Chloride: 103 mEq/L (ref 96–112)
Creatinine, Ser: 0.96 mg/dL (ref 0.40–1.50)
GFR: 68.8 mL/min (ref >60.00)
Glucose, Bld: 123 mg/dL — ABNORMAL HIGH (ref 70–99)
Potassium: 4.6 mEq/L (ref 3.5–5.1)
Sodium: 139 mEq/L (ref 135–145)
Total Bilirubin: 0.7 mg/dL (ref 0.2–1.2)
Total Protein: 6.7 g/dL (ref 6.0–8.3)

## 2023-01-26 LAB — CBC
HCT: 37.4 % — ABNORMAL LOW (ref 39.0–52.0)
Hemoglobin: 12.5 g/dL — ABNORMAL LOW (ref 13.0–17.0)
MCHC: 33.4 g/dL (ref 30.0–36.0)
MCV: 99 fl (ref 78.0–100.0)
Platelets: 162 10*3/uL (ref 150.0–400.0)
RBC: 3.77 Mil/uL — ABNORMAL LOW (ref 4.22–5.81)
RDW: 13.2 % (ref 11.5–15.5)
WBC: 7.6 10*3/uL (ref 4.0–10.5)

## 2023-02-01 ENCOUNTER — Telehealth: Payer: Self-pay | Admitting: Family Medicine

## 2023-02-01 NOTE — Telephone Encounter (Signed)
Pt called to go over results.

## 2023-07-30 NOTE — Progress Notes (Unsigned)
[facility] at [facility] [address] [place], [place] [address] [phone]  Date:  08/01/2023   Name:  TOWNES STEFFENSON   DOB:  11-19-1928   MRN:  [id number]  PCP:  Pearline Cables, MD    Chief Complaint: No chief complaint on file.   History of Present Illness:  Bill Price is a 89 y.o. very pleasant male patient who presents with the following:  Patient seen today for periodic follow-up Most recent visit with myself was in March of this year  No current medications, diet controlled hypertension He lives alone with his cat, his granddaughter who lives in [place] is his closest family member at this time.  She does check in with him regularly Flu vaccine Recommend COVID booster this fall Recommend shingles series at pharmacy  We did lab work in [employer] looks okay Patient Active Problem List   Diagnosis Date Noted   Closed displaced fracture of right femoral neck ([facility]) 06/11/2020   PVD (peripheral vascular disease) ([facility]) 06/11/2020   Impotence of organic origin 01/28/2014   ACTINIC KERATOSIS, HEAD 10/05/2010   ANXIETY 06/04/2010   HYPOTENSION, ORTHOSTATIC 06/04/2010   IRRITABLE BOWEL SYNDROME 04/15/2010   ALLERGIC RHINITIS 09/10/2009   GERD 04/22/2009   ARTHRITIS, HIP 05/15/2008   OSTEOPENIA 01/17/2008   HYPERLIPIDEMIA 09/21/2007   RENAL CALCULUS, HX OF 09/21/2007    Past Medical History:  Diagnosis Date   Allergic rhinitis    Anxiety    Arthritis    Colon polyp    GERD (gastroesophageal reflux disease)    Hemorrhoids    Hyperlipidemia    IBS (irritable bowel syndrome)    Impotence    Kidney stone    Orthostatic hypotension    Osteopenia    PVD (peripheral vascular disease) ([facility])    Urinary obstruction     Past Surgical History:  Procedure Laterality Date   COLONOSCOPY  2007   HERNIA REPAIR  2005   TOTAL HIP ARTHROPLASTY Right 06/10/2020   Procedure: TOTAL HIP ARTHROPLASTY ANTERIOR APPROACH;   Surgeon: Samson Frederic, MD;  Location: [facility];  Service: Orthopedics;  Laterality: Right;    Social History   Tobacco Use   Smoking status: Never   Smokeless tobacco: Never  Vaping Use   Vaping status: Never Used  Substance Use Topics   Alcohol use: Yes    Alcohol/week: 0.0 standard drinks of alcohol    Comment: 1 glass of wine a day   Drug use: No    Family History  Problem Relation Age of Onset   Prostate cancer Brother    Colon cancer Neg Hx    Colon polyps Neg Hx    Diabetes Neg Hx    Kidney disease Neg Hx    Esophageal cancer Neg Hx    Gallbladder disease Neg Hx    Heart disease Neg Hx     Allergies  Allergen Reactions   Augmentin [Amoxicillin-Pot Clavulanate] Diarrhea   Crestor [Rosuvastatin]     Shaky, hot flashes    Medication list has been reviewed and updated.  No current outpatient medications on file prior to visit.   No current facility-administered medications on file prior to visit.    Review of Systems:  As per HPI- otherwise negative.   Physical Examination: There were no vitals filed for this visit. There were no vitals filed for this visit. There is no height or weight on file to calculate BMI. Ideal Body  Weight:    GEN: no acute distress. HEENT: Atraumatic, Normocephalic.  Ears and Nose: No external deformity. CV: RRR, No M/G/R. No JVD. No thrill. No extra heart sounds. PULM: CTA B, no wheezes, crackles, rhonchi. No retractions. No resp. distress. No accessory muscle use. ABD: S, NT, ND, +BS. No rebound. No HSM. EXTR: No c/c/e PSYCH: Normally interactive. Conversant.    Assessment and Plan: ***  Signed Abbe Amsterdam, MD

## 2023-07-30 NOTE — Patient Instructions (Incomplete)
It was good to see you again today As well, please see below about 6 months I would recommend getting the COVID booster this fall, also consider getting the shingles vaccine series at your pharmacy Flu shot this fall as usual Take care!  Please let me know if you have any trouble with getting enough food, transportation, etc

## 2023-08-01 ENCOUNTER — Ambulatory Visit: Payer: Medicare Other | Admitting: Family Medicine

## 2023-08-01 ENCOUNTER — Other Ambulatory Visit (HOSPITAL_BASED_OUTPATIENT_CLINIC_OR_DEPARTMENT_OTHER): Payer: Self-pay

## 2023-08-01 VITALS — BP 118/60 | HR 67 | Temp 98.1°F | Resp 18 | Ht 68.0 in | Wt 178.4 lb

## 2023-08-01 DIAGNOSIS — I10 Essential (primary) hypertension: Secondary | ICD-10-CM

## 2023-08-01 DIAGNOSIS — Z23 Encounter for immunization: Secondary | ICD-10-CM

## 2023-08-01 LAB — BASIC METABOLIC PANEL
BUN: 17 mg/dL (ref 6–23)
CO2: 25 mEq/L (ref 19–32)
Calcium: 9.2 mg/dL (ref 8.4–10.5)
Chloride: 105 mEq/L (ref 96–112)
Creatinine, Ser: 0.97 mg/dL (ref 0.40–1.50)
GFR: 67.7 mL/min (ref >60.00)
Glucose, Bld: 97 mg/dL (ref 70–99)
Potassium: 4.4 mEq/L (ref 3.5–5.1)
Sodium: 138 mEq/L (ref 135–145)

## 2023-08-01 LAB — CBC
HCT: 37.5 % — ABNORMAL LOW (ref 39.0–52.0)
Hemoglobin: 12.2 g/dL — ABNORMAL LOW (ref 13.0–17.0)
MCHC: 32.5 g/dL (ref 30.0–36.0)
MCV: 100.3 fl — ABNORMAL HIGH (ref 78.0–100.0)
Platelets: 143 10*3/uL — ABNORMAL LOW (ref 150.0–400.0)
RBC: 3.74 Mil/uL — ABNORMAL LOW (ref 4.22–5.81)
RDW: 13.3 % (ref 11.5–15.5)
WBC: 5.8 10*3/uL (ref 4.0–10.5)

## 2023-08-01 MED ORDER — COVID-19 MRNA VAC-TRIS(PFIZER) 30 MCG/0.3ML IM SUSY
0.3000 mL | PREFILLED_SYRINGE | Freq: Once | INTRAMUSCULAR | 0 refills | Status: AC
  Filled 2023-08-01: qty 0.3, 1d supply, fill #0

## 2023-11-29 ENCOUNTER — Ambulatory Visit (INDEPENDENT_AMBULATORY_CARE_PROVIDER_SITE_OTHER): Payer: Medicare Other

## 2023-11-29 VITALS — Ht 68.0 in | Wt 178.0 lb

## 2023-11-29 DIAGNOSIS — Z0000 Encounter for general adult medical examination without abnormal findings: Secondary | ICD-10-CM

## 2023-11-29 NOTE — Patient Instructions (Addendum)
Mr. Gulden , Thank you for taking time to come for your Medicare Wellness Visit. I appreciate your ongoing commitment to your health goals. Please review the following plan we discussed and let me know if I can assist you in the future.   Referrals/Orders/Follow-Ups/Clinician Recommendations:   This is a list of the screening recommended for you and due dates:  Health Maintenance  Topic Date Due   Zoster (Shingles) Vaccine (1 of 2) Never done   COVID-19 Vaccine (5 - 2024-25 season) 09/26/2023   Flu Shot  02/06/2024*   Medicare Annual Wellness Visit  11/28/2024   DTaP/Tdap/Td vaccine (3 - Td or Tdap) 06/09/2032   Pneumonia Vaccine  Completed   HPV Vaccine  Aged Out  *Topic was postponed. The date shown is not the original due date.    Advanced directives: (Copy Requested) Please bring a copy of your health care power of attorney and living will to the office to be added to your chart at your convenience.  Next Medicare Annual Wellness Visit scheduled for next year: Yes

## 2023-11-29 NOTE — Progress Notes (Signed)
Subjective:   Bill Price is a 89 y.o. male who presents for Medicare Annual/Subsequent preventive examination.  Visit Complete: Virtual I connected with  [name] on 11/29/23 by a audio enabled telemedicine application and verified that I am speaking with the correct person using two identifiers.  Patient Location: Home  Provider Location: Home Office  I discussed the limitations of evaluation and management by telemedicine. The patient expressed understanding and agreed to proceed.  Vital Signs: Because this visit was a virtual/telehealth visit, some criteria may be missing or patient reported. Any vitals not documented were not able to be obtained and vitals that have been documented are patient reported.   Cardiac Risk Factors include: advanced age (>63men, >42 women);male gender     Objective:    Today's Vitals   11/29/23 0859  Weight: 178 lb (80.7 kg)  Height: 5\' 8"  (1.727 m)   Body mass index is 27.06 kg/m.     11/29/2023    9:08 AM 11/24/2022    8:52 AM 06/09/2022    9:12 AM 04/23/2021    8:08 AM 06/09/2020    6:20 PM 06/09/2020    1:13 PM 05/27/2020    9:27 AM  Advanced Directives  Does Patient Have a Medical Advance Directive? Yes No No No Yes No No  Type of Estate agent of [place];Living will    Healthcare Power of Attorney    Does patient want to make changes to medical advance directive?     No - Patient declined    Copy of Healthcare Power of Attorney in Chart? No - copy requested        Would patient like information on creating a medical advance directive?  No - Patient declined  Yes (MAU/Ambulatory/Procedural Areas - Information given)       Current Medications (verified) No outpatient encounter medications on file as of 11/29/2023.   No facility-administered encounter medications on file as of 11/29/2023.    Allergies (verified) Augmentin [amoxicillin-pot clavulanate] and Crestor [rosuvastatin]   History: Past Medical  History:  Diagnosis Date   Allergic rhinitis    Anxiety    Arthritis    Colon polyp    GERD (gastroesophageal reflux disease)    Hemorrhoids    Hyperlipidemia    IBS (irritable bowel syndrome)    Impotence    Kidney stone    Orthostatic hypotension    Osteopenia    PVD (peripheral vascular disease) (H[facility])    Urinary obstruction    Past Surgical History:  Procedure Laterality Date   COLONOSCOPY  2007   HERNIA REPAIR  2005   TOTAL HIP ARTHROPLASTY Right 06/10/2020   Procedure: TOTAL HIP ARTHROPLASTY ANTERIOR APPROACH;  Surgeon: Samson Frederic, MD;  Location: [facility];  Service: Orthopedics;  Laterality: Right;   Family History  Problem Relation Age of Onset   Prostate cancer Brother    Colon cancer Neg Hx    Colon polyps Neg Hx    Diabetes Neg Hx    Kidney disease Neg Hx    Esophageal cancer Neg Hx    Gallbladder disease Neg Hx    Heart disease Neg Hx    Social History   Socioeconomic History   Marital status: Widowed    Spouse name: Not on file   Number of children: 0   Years of education: Not on file   Highest education level: Not on file  Occupational History   Occupation: Retired  Tobacco Use   Smoking status:  Never   Smokeless tobacco: Never  Vaping Use   Vaping status: Never Used  Substance and Sexual Activity   Alcohol use: Yes    Alcohol/week: 0.0 standard drinks of alcohol    Comment: 1 glass of wine a day   Drug use: No   Sexual activity: Never  Other Topics Concern   Not on file  Social History Narrative   Not on file   Social Drivers of Health   Financial Resource Strain: Low Risk  (11/29/2023)   Overall Financial Resource Strain (CARDIA)    Difficulty of Paying Living Expenses: Not hard at all  Food Insecurity: No Food Insecurity (11/29/2023)   Hunger Vital Sign    Worried About Running Out of Food in the Last Year: Never true    Ran Out of Food in the Last Year: Never true  Transportation Needs: No Transportation Needs (11/29/2023)   PRAPARE  - Administrator, Civil Service (Medical): No    Lack of Transportation (Non-Medical): No  Physical Activity: Sufficiently Active (11/29/2023)   Exercise Vital Sign    Days of Exercise per Week: 7 days    Minutes of Exercise per Session: 30 min  Stress: No Stress Concern Present (11/29/2023)   [employer] of Occupational Health - Occupational Stress Questionnaire    Feeling of Stress : Not at all  Social Connections: Moderately Integrated (11/29/2023)   Social Connection and Isolation Panel [NHANES]    Frequency of Communication with Friends and Family: More than three times a week    Frequency of Social Gatherings with Friends and Family: More than three times a week    Attends Religious Services: More than 4 times per year    Active Member of [employer] or Organizations: Yes    Attends Banker Meetings: More than 4 times per year    Marital Status: Widowed    Tobacco Counseling Counseling given: Not Answered   Clinical Intake:  Pre-visit preparation completed: Yes  Pain : No/denies pain     BMI - recorded: 27.06 Nutritional Status: BMI 25 -29 Overweight Nutritional Risks: None Diabetes: No  How often do you need to have someone help you when you read instructions, pamphlets, or other written materials from your doctor or pharmacy?: 1 - Never  Interpreter Needed?: No  Information entered by :: Theresa Mulligan LPN   Activities of Daily Living    11/29/2023    9:07 AM  In your present state of health, do you have any difficulty performing the following activities:  Hearing? 1  Comment Wears Hearing Aids  Vision? 0  Difficulty concentrating or making decisions? 0  Walking or climbing stairs? 0  Dressing or bathing? 0  Doing errands, shopping? 0  Preparing Food and eating ? N  Using the Toilet? N  In the past six months, have you accidently leaked urine? N  Do you have problems with loss of bowel control? N  Managing your Medications? N   Managing your Finances? N  Housekeeping or managing your Housekeeping? N    Patient Care Team: Copland, Gwenlyn Found, MD as PCP - General (Family Medicine)  Indicate any recent Medical Services you may have received from other than [facility] providers in the past year (date may be approximate).     Assessment:   This is a routine wellness examination for [place].  Hearing/Vision screen Hearing Screening - Comments:: Wears Hearing Aids Vision Screening - Comments:: Wears rx glasses - up to date with routine  eye exams with  [facility]   Goals Addressed               This Visit's Progress     Stay Active (pt-stated)        Continue Exercise       Depression Screen    11/29/2023    9:06 AM 08/01/2023    8:57 AM 01/26/2023    9:00 AM 11/24/2022    8:51 AM 07/28/2022    8:15 AM 06/29/2021    2:38 PM 04/23/2021    8:11 AM  PHQ 2/9 Scores  PHQ - 2 Score 0 0 0 0 0 0 0    Fall Risk    11/29/2023    9:08 AM 08/01/2023    8:56 AM 01/26/2023    9:00 AM 11/24/2022    8:52 AM 07/28/2022    8:14 AM  Fall Risk   Falls in the past year? 0 0 0 1 1  Number falls in past yr: 0 0 0 1 0  Injury with Fall? 0 0 0 1 0  Comment    skinned right arm   Risk for fall due to : No Fall Risks No Fall Risks No Fall Risks Impaired vision;Impaired balance/gait   Follow up Falls prevention discussed Falls evaluation completed Falls evaluation completed Falls prevention discussed Falls evaluation completed    MEDICARE RISK AT HOME: Medicare Risk at Home Any stairs in or around the home?: No If so, are there any without handrails?: No Home free of loose throw rugs in walkways, pet beds, electrical cords, etc?: Yes Adequate lighting in your home to reduce risk of falls?: Yes Life alert?: No Use of a cane, walker or w/c?: No Grab bars in the bathroom?: Yes Shower chair or bench in shower?: Yes Elevated toilet seat or a handicapped toilet?: Yes  TIMED UP AND GO:  Was the test performed?  No     Cognitive Function:    03/29/2017   11:10 AM  MMSE - Mini Mental State Exam  Orientation to time 5  Orientation to Place 5  Registration 3  Attention/ Calculation 4  Recall 2  Language- name 2 objects 2  Language- repeat 1  Language- follow 3 step command 3  Language- read & follow direction 1  Write a sentence 1  Copy design 0  Total score 27        11/29/2023    9:09 AM 11/24/2022    8:54 AM 04/17/2020    8:16 AM  6CIT Screen  What Year? 0 points 0 points 0 points  What month? 0 points 0 points 0 points  What time? 0 points 0 points 0 points  Count back from 20 0 points 0 points 0 points  Months in reverse 0 points 0 points 0 points  Repeat phrase 0 points 0 points 0 points  Total Score 0 points 0 points 0 points    Immunizations Immunization History  Administered Date(s) Administered   Fluad Quad(high Dose 65+) 07/28/2022   Influenza Split 08/26/2011, 08/08/2012   Influenza Whole 09/21/2007, 08/20/2009, 08/04/2010   Influenza, High Dose Seasonal PF 08/16/2016, 08/24/2017, 10/13/2018   Influenza,inj,Quad PF,6+ Mos 08/16/2013, 09/19/2014, 08/07/2015   Influenza-Unspecified 07/09/2020   Moderna SARS-COV2 Booster Vaccination 06/29/2021   Moderna Sars-Covid-2 Vaccination 01/23/2020, 02/20/2020, 09/18/2020   Pfizer(Comirnaty)Fall Seasonal Vaccine 12 years and older 08/01/2023   Pneumococcal Conjugate-13 10/24/2014   Pneumococcal Polysaccharide-23 08/31/2017   Tdap 03/09/2008, 06/09/2022    TDAP status: Up to date  Flu Vaccine status: Declined, Education has been provided regarding the importance of this vaccine but patient still declined. Advised may receive this vaccine at local pharmacy or Health Dept. Aware to provide a copy of the vaccination record if obtained from local pharmacy or Health Dept. Verbalized acceptance and understanding.  Pneumococcal vaccine status: Up to date  Covid-19 vaccine status: Declined, Education has been provided regarding the  importance of this vaccine but patient still declined. Advised may receive this vaccine at local pharmacy or Health Dept.or vaccine clinic. Aware to provide a copy of the vaccination record if obtained from local pharmacy or Health Dept. Verbalized acceptance and understanding.  Qualifies for Shingles Vaccine? Yes   Zostavax completed No   Shingrix Completed?: No.    Education has been provided regarding the importance of this vaccine. Patient has been advised to call insurance company to determine out of pocket expense if they have not yet received this vaccine. Advised may also receive vaccine at local pharmacy or Health Dept. Verbalized acceptance and understanding.  Screening Tests Health Maintenance  Topic Date Due   Zoster Vaccines- Shingrix (1 of 2) Never done   COVID-19 Vaccine (5 - 2024-25 season) 09/26/2023   INFLUENZA VACCINE  02/06/2024 (Originally 06/09/2023)   Medicare Annual Wellness (AWV)  11/28/2024   DTaP/Tdap/Td (3 - Td or Tdap) 06/09/2032   Pneumonia Vaccine 20+ Years old  Completed   HPV VACCINES  Aged Out    Health Maintenance  Health Maintenance Due  Topic Date Due   Zoster Vaccines- Shingrix (1 of 2) Never done   COVID-19 Vaccine (5 - 2024-25 season) 09/26/2023      Additional Screening:    Vision Screening: Recommended annual ophthalmology exams for early detection of glaucoma and other disorders of the eye. Is the patient up to date with their annual eye exam?  Yes  Who is the provider or what is the name of the office in which the patient attends annual eye exams? [facility] If pt is not established with a provider, would they like to be referred to a provider to establish care? No .   Dental Screening: Recommended annual dental exams for proper oral hygiene    Community Resource Referral / Chronic Care Management:  CRR required this visit?  No   CCM required this visit?  No     Plan:     I have personally reviewed and noted the  following in the patient's chart:   Medical and social history Use of alcohol, tobacco or illicit drugs  Current medications and supplements including opioid prescriptions. Patient is not currently taking opioid prescriptions. Functional ability and status Nutritional status Physical activity Advanced directives List of other physicians Hospitalizations, surgeries, and ER visits in previous 12 months Vitals Screenings to include cognitive, depression, and falls Referrals and appointments  In addition, I have reviewed and discussed with patient certain preventive protocols, quality metrics, and best practice recommendations. A written personalized care plan for preventive services as well as general preventive health recommendations were provided to patient.     Tillie Rung, LPN   [id number]   After Visit Summary: (MyChart) Due to this being a telephonic visit, the after visit summary with patients personalized plan was offered to patient via MyChart   Nurse Notes: None

## 2024-01-29 NOTE — Patient Instructions (Incomplete)
 It was great to see you again today, assuming all is well please see me in about 6 months.  If you have not already I recommend getting the RSV vaccine, and Shingrix vaccine series at your pharmacy.  Also recommend a COVID booster if none in the last 6 months or so- done

## 2024-01-29 NOTE — Progress Notes (Unsigned)
 [facility] at [facility] [address] [place], [place] [address] [phone]  Date:  01/30/2024   Name:  Bill Price   DOB:  1929-06-11   MRN:  [id number]  PCP:  Pearline Cables, MD    Chief Complaint: No chief complaint on file.   History of Present Illness:  Bill Price is a 89 y.o. very pleasant male patient who presents with the following:  Patient seen today for periodic follow-up Most recent visit with myself was in September  No current medications, diet controlled hypertension He lives alone with his cat, his granddaughter who lives in [place] is his closest family member at this time.  She does check in with him regularly.  He notes he speaks to her most days and that she does come and visit him on occasion  I did a basic metabolic profile, CBC in September-can obtain blood work today if he would like  Patient Active Problem List   Diagnosis Date Noted   Closed displaced fracture of right femoral neck (HCC) 06/11/2020   PVD (peripheral vascular disease) (HCC) 06/11/2020   Impotence of organic origin 01/28/2014   ACTINIC KERATOSIS, HEAD 10/05/2010   Anxiety state 06/04/2010   HYPOTENSION, ORTHOSTATIC 06/04/2010   IRRITABLE BOWEL SYNDROME 04/15/2010   Allergic rhinitis 09/10/2009   GERD 04/22/2009   Arthropathy of pelvic region and thigh 05/15/2008   Disorder of bone and cartilage 01/17/2008   HYPERLIPIDEMIA 09/21/2007   RENAL CALCULUS, HX OF 09/21/2007    Past Medical History:  Diagnosis Date   Allergic rhinitis    Anxiety    Arthritis    Colon polyp    GERD (gastroesophageal reflux disease)    Hemorrhoids    Hyperlipidemia    IBS (irritable bowel syndrome)    Impotence    Kidney stone    Orthostatic hypotension    Osteopenia    PVD (peripheral vascular disease) (HCC)    Urinary obstruction     Past Surgical History:  Procedure Laterality Date   COLONOSCOPY  2007   HERNIA REPAIR  2005    TOTAL HIP ARTHROPLASTY Right 06/10/2020   Procedure: TOTAL HIP ARTHROPLASTY ANTERIOR APPROACH;  Surgeon: Samson Frederic, MD;  Location: [facility];  Service: Orthopedics;  Laterality: Right;    Social History   Tobacco Use   Smoking status: Never   Smokeless tobacco: Never  Vaping Use   Vaping status: Never Used  Substance Use Topics   Alcohol use: Yes    Alcohol/week: 0.0 standard drinks of alcohol    Comment: 1 glass of wine a day   Drug use: No    Family History  Problem Relation Age of Onset   Prostate cancer Brother    Colon cancer Neg Hx    Colon polyps Neg Hx    Diabetes Neg Hx    Kidney disease Neg Hx    Esophageal cancer Neg Hx    Gallbladder disease Neg Hx    Heart disease Neg Hx     Allergies  Allergen Reactions   Augmentin [Amoxicillin-Pot Clavulanate] Diarrhea   Crestor [Rosuvastatin]     Shaky, hot flashes    Medication list has been reviewed and updated.  No current outpatient medications on file prior to visit.   No current facility-administered medications on file prior to visit.    Review of Systems:  As per HPI- otherwise negative.   Physical Examination: There were no vitals filed for  this visit. There were no vitals filed for this visit. There is no height or weight on file to calculate BMI. Ideal Body Weight:    .peful  Assessment and Plan: ***  Signed Abbe Amsterdam, MD

## 2024-01-30 ENCOUNTER — Other Ambulatory Visit (HOSPITAL_BASED_OUTPATIENT_CLINIC_OR_DEPARTMENT_OTHER): Payer: Self-pay

## 2024-01-30 ENCOUNTER — Ambulatory Visit (INDEPENDENT_AMBULATORY_CARE_PROVIDER_SITE_OTHER): Payer: Medicare Other | Admitting: Family Medicine

## 2024-01-30 VITALS — BP 118/72 | HR 75 | Temp 97.7°F | Resp 18 | Ht 68.0 in | Wt 176.0 lb

## 2024-01-30 DIAGNOSIS — I10 Essential (primary) hypertension: Secondary | ICD-10-CM | POA: Diagnosis not present

## 2024-01-30 DIAGNOSIS — Z131 Encounter for screening for diabetes mellitus: Secondary | ICD-10-CM | POA: Diagnosis not present

## 2024-01-30 DIAGNOSIS — Z130 Encounter for screening for diseases of the blood and blood-forming organs and certain disorders involving the immune mechanism: Secondary | ICD-10-CM | POA: Diagnosis not present

## 2024-01-30 LAB — COMPREHENSIVE METABOLIC PANEL
ALT: 10 U/L (ref 0–53)
AST: 18 U/L (ref 0–37)
Albumin: 4 g/dL (ref 3.5–5.2)
Alkaline Phosphatase: 68 U/L (ref 39–117)
BUN: 14 mg/dL (ref 6–23)
CO2: 27 meq/L (ref 19–32)
Calcium: 9.3 mg/dL (ref 8.4–10.5)
Chloride: 103 meq/L (ref 96–112)
Creatinine, Ser: 0.98 mg/dL (ref 0.40–1.50)
GFR: 66.64 mL/min (ref >60.00)
Glucose, Bld: 97 mg/dL (ref 70–99)
Potassium: 4.5 meq/L (ref 3.5–5.1)
Sodium: 139 meq/L (ref 135–145)
Total Bilirubin: 0.8 mg/dL (ref 0.2–1.2)
Total Protein: 6.9 g/dL (ref 6.0–8.3)

## 2024-01-30 LAB — CBC
HCT: 37.6 % — ABNORMAL LOW (ref 39.0–52.0)
Hemoglobin: 12.5 g/dL — ABNORMAL LOW (ref 13.0–17.0)
MCHC: 33.3 g/dL (ref 30.0–36.0)
MCV: 99.8 fl (ref 78.0–100.0)
Platelets: 138 10*3/uL — ABNORMAL LOW (ref 150.0–400.0)
RBC: 3.77 Mil/uL — ABNORMAL LOW (ref 4.22–5.81)
RDW: 13.2 % (ref 11.5–15.5)
WBC: 5.2 10*3/uL (ref 4.0–10.5)

## 2024-01-30 LAB — HEMOGLOBIN A1C: Hgb A1c MFr Bld: 5.9 % (ref 4.6–6.5)

## 2024-01-30 MED ORDER — RSVPREF3 VAC RECOMB ADJUVANTED 120 MCG/0.5ML IM SUSR
0.5000 mL | Freq: Once | INTRAMUSCULAR | 0 refills | Status: AC
  Filled 2024-01-30: qty 0.5, 1d supply, fill #0

## 2024-07-31 NOTE — Patient Instructions (Signed)
 It was great to see you again today as always!  If not done already recommend a flu shot this fall as well as COVID booster Also consider getting the shingles vaccine series at your pharmacy Assuming you are doing well please see me in about 6 months

## 2024-07-31 NOTE — Progress Notes (Unsigned)
 [facility] at [facility] [address] [place], [place] [address] [phone]  Date:  08/01/2024   Name:  Bill Price   DOB:  July 12, 1929   MRN:  [id number]  PCP:  Watt Harlene BROCKS, MD    Chief Complaint: No chief complaint on file.   History of Present Illness:  Bill Price is a 89 y.o. very pleasant male patient who presents with the following:  Patient seen today for periodic follow-up.  I saw him most recently in March He does have some family support.  He has a daughter Bill Price and his granddaughter who lives in [place] calls him most days He is able to live on the ground floor only in his home and eats out most of his meals He is not taking any medications  Recommend flu shot Recommend COVID booster this fall Recommend Shingrix series  We did labs in March-minimal anemia which is stable and A1c 5.9  Discussed the use of AI scribe software for clinical note transcription with the patient, who gave verbal consent to proceed.  History of Present Illness    Patient Active Problem List   Diagnosis Date Noted   Closed displaced fracture of right femoral neck (HCC) 06/11/2020   PVD (peripheral vascular disease) 06/11/2020   Impotence of organic origin 01/28/2014   ACTINIC KERATOSIS, HEAD 10/05/2010   Anxiety state 06/04/2010   HYPOTENSION, ORTHOSTATIC 06/04/2010   IRRITABLE BOWEL SYNDROME 04/15/2010   Allergic rhinitis 09/10/2009   GERD 04/22/2009   Arthropathy of pelvic region and thigh 05/15/2008   Disorder of bone and cartilage 01/17/2008   HYPERLIPIDEMIA 09/21/2007   RENAL CALCULUS, HX OF 09/21/2007    Past Medical History:  Diagnosis Date   Allergic rhinitis    Anxiety    Arthritis    Colon polyp    GERD (gastroesophageal reflux disease)    Hemorrhoids    Hyperlipidemia    IBS (irritable bowel syndrome)    Impotence    Kidney stone    Orthostatic hypotension    Osteopenia    PVD (peripheral  vascular disease)    Urinary obstruction     Past Surgical History:  Procedure Laterality Date   COLONOSCOPY  2007   HERNIA REPAIR  2005   TOTAL HIP ARTHROPLASTY Right 06/10/2020   Procedure: TOTAL HIP ARTHROPLASTY ANTERIOR APPROACH;  Surgeon: Fidel Rogue, MD;  Location: [facility];  Service: Orthopedics;  Laterality: Right;    Social History   Tobacco Use   Smoking status: Never   Smokeless tobacco: Never  Vaping Use   Vaping status: Never Used  Substance Use Topics   Alcohol  use: Yes    Alcohol /week: 0.0 standard drinks of alcohol     Comment: 1 glass of wine a day   Drug use: No    Family History  Problem Relation Age of Onset   Prostate cancer Brother    Colon cancer Neg Hx    Colon polyps Neg Hx    Diabetes Neg Hx    Kidney disease Neg Hx    Esophageal cancer Neg Hx    Gallbladder disease Neg Hx    Heart disease Neg Hx     Allergies  Allergen Reactions   Augmentin [Amoxicillin-Pot Clavulanate] Diarrhea   Crestor  [Rosuvastatin ]     Shaky, hot flashes    Medication list has been reviewed and updated.  No current outpatient medications on file prior to visit.   No current facility-administered  medications on file prior to visit.    Review of Systems:  As per HPI- otherwise negative.   Physical Examination: There were no vitals filed for this visit. There were no vitals filed for this visit. There is no height or weight on file to calculate BMI. Ideal Body Weight:    GEN: no acute distress. HEENT: Atraumatic, Normocephalic.  Ears and Nose: No external deformity. CV: RRR, No M/G/R. No JVD. No thrill. No extra heart sounds. PULM: CTA B, no wheezes, crackles, rhonchi. No retractions. No resp. distress. No accessory muscle use. ABD: S, NT, ND, +BS. No rebound. No HSM. EXTR: No c/c/e PSYCH: Normally interactive. Conversant.    Assessment and Plan: No diagnosis found.  Assessment & Plan   Signed Harlene Schroeder, MD

## 2024-08-01 ENCOUNTER — Encounter: Payer: Self-pay | Admitting: Family Medicine

## 2024-08-01 ENCOUNTER — Ambulatory Visit (INDEPENDENT_AMBULATORY_CARE_PROVIDER_SITE_OTHER): Admitting: Family Medicine

## 2024-08-01 VITALS — BP 134/80 | HR 73 | Ht 68.0 in | Wt 174.4 lb

## 2024-08-01 DIAGNOSIS — E7439 Other disorders of intestinal carbohydrate absorption: Secondary | ICD-10-CM

## 2024-08-01 DIAGNOSIS — D649 Anemia, unspecified: Secondary | ICD-10-CM | POA: Diagnosis not present

## 2024-08-01 LAB — BASIC METABOLIC PANEL WITH GFR
BUN: 16 mg/dL (ref 6–23)
CO2: 26 meq/L (ref 19–32)
Calcium: 9.6 mg/dL (ref 8.4–10.5)
Chloride: 102 meq/L (ref 96–112)
Creatinine, Ser: 1.19 mg/dL (ref 0.40–1.50)
GFR: 52.61 mL/min — ABNORMAL LOW (ref >60.00)
Glucose, Bld: 112 mg/dL — ABNORMAL HIGH (ref 70–99)
Potassium: 4.4 meq/L (ref 3.5–5.1)
Sodium: 138 meq/L (ref 135–145)

## 2024-08-01 LAB — CBC
HCT: 38.1 % — ABNORMAL LOW (ref 39.0–52.0)
Hemoglobin: 12.6 g/dL — ABNORMAL LOW (ref 13.0–17.0)
MCHC: 33.1 g/dL (ref 30.0–36.0)
MCV: 98.8 fl (ref 78.0–100.0)
Platelets: 142 K/uL — ABNORMAL LOW (ref 150.0–400.0)
RBC: 3.86 Mil/uL — ABNORMAL LOW (ref 4.22–5.81)
RDW: 13.1 % (ref 11.5–15.5)
WBC: 4.9 K/uL (ref 4.0–10.5)

## 2024-12-04 ENCOUNTER — Ambulatory Visit

## 2024-12-04 VITALS — Ht 68.0 in | Wt 174.0 lb

## 2024-12-04 DIAGNOSIS — Z0000 Encounter for general adult medical examination without abnormal findings: Secondary | ICD-10-CM

## 2024-12-04 NOTE — Patient Instructions (Addendum)
 Mr. Bill Price,  Thank you for taking the time for your Medicare Wellness Visit. I appreciate your continued commitment to your health goals. Please review the care plan we discussed, and feel free to reach out if I can assist you further.  Please note that Annual Wellness Visits do not include a physical exam. Some assessments may be limited, especially if the visit was conducted virtually. If needed, we may recommend an in-person follow-up with your provider.  Ongoing Care Seeing your primary care provider every 3 to 6 months helps us  monitor your health and provide consistent, personalized care.   Referrals If a referral was made during today's visit and you haven't received any updates within two weeks, please contact the referred provider directly to check on the status.  Recommended Screenings:  Health Maintenance  Topic Date Due   Zoster (Shingles) Vaccine (1 of 2) Never done   Flu Shot  06/08/2024   COVID-19 Vaccine (5 - 2025-26 season) 07/09/2024   Medicare Annual Wellness Visit  12/04/2025   DTaP/Tdap/Td vaccine (3 - Td or Tdap) 06/09/2032   Pneumococcal Vaccine for age over 38  Completed   Meningitis B Vaccine  Aged Out       12/04/2024   10:54 AM  Advanced Directives  Does Patient Have a Medical Advance Directive? No  Would patient like information on creating a medical advance directive? No - Patient declined    Vision: Annual vision screenings are recommended for early detection of glaucoma, cataracts, and diabetic retinopathy. These exams can also reveal signs of chronic conditions such as diabetes and high blood pressure.  Dental: Annual dental screenings help detect early signs of oral cancer, gum disease, and other conditions linked to overall health, including heart disease and diabetes.  Please see the attached documents for additional preventive care recommendations.

## 2024-12-04 NOTE — Progress Notes (Signed)
 "  Chief Complaint  Patient presents with   Medicare Wellness     Subjective:   Bill Price is a 89 y.o. male who presents for a Medicare Annual Wellness Visit.  Visit info / Clinical Intake: Medicare Wellness Visit Type:: Subsequent Annual Wellness Visit Persons participating in visit and providing information:: patient Medicare Wellness Visit Mode:: Telephone If telephone:: video declined Since this visit was completed virtually, some vitals may be partially provided or unavailable. Missing vitals are due to the limitations of the virtual format.: Documented vitals are patient reported If Telephone or Video please confirm:: I connected with patient using audio/video enable telemedicine. I verified patient identity with two identifiers, discussed telehealth limitations, and patient agreed to proceed. Patient Location:: Home Provider Location:: Office Interpreter Needed?: No Pre-visit prep was completed: yes AWV questionnaire completed by patient prior to visit?: no Living arrangements:: (!) lives alone Patient's Overall Health Status Rating: very good Typical amount of pain: none Does pain affect daily life?: no Are you currently prescribed opioids?: no  Dietary Habits and Nutritional Risks How many meals a day?: 3 Eats fruit and vegetables daily?: yes Most meals are obtained by: preparing own meals; eating out In the last 2 weeks, have you had any of the following?: none Diabetic:: no  Functional Status Activities of Daily Living (to include ambulation/medication): Independent Ambulation: Independent with device- listed below Home Assistive Devices/Equipment: Eyeglasses; Other (Comment) (Hearing Aids) Medication Administration: Independent Home Management (perform basic housework or laundry): Independent Manage your own finances?: yes Primary transportation is: driving Concerns about vision?: no *vision screening is required for WTM* Concerns about hearing?: (!)  yes Uses hearing aids?: (!) yes Hear whispered voice?: (!) no *in-person visit only*  Fall Screening Falls in the past year?: 1 Number of falls in past year: 1 Was there an injury with Fall?: 0 Fall Risk Category Calculator: 2 Patient Fall Risk Level: Moderate Fall Risk  Fall Risk Patient at Risk for Falls Due to: Impaired balance/gait Fall risk Follow up: Falls evaluation completed; Education provided  Home and Transportation Safety: All rugs have non-skid backing?: N/A, no rugs All stairs or steps have railings?: yes Grab bars in the bathtub or shower?: yes Have non-skid surface in bathtub or shower?: yes Good home lighting?: yes Regular seat belt use?: yes Hospital stays in the last year:: no  Cognitive Assessment Difficulty concentrating, remembering, or making decisions? : no Will 6CIT or Mini Cog be Completed: yes What year is it?: 0 points What month is it?: 0 points Give patient an address phrase to remember (5 components): [address] [place]  About what time is it?: 0 points Count backwards from 20 to 1: 0 points Say the months of the year in reverse: 0 points Repeat the address phrase from earlier: 0 points 6 CIT Score: 0 points  Advance Directives (For Healthcare) Does Patient Have a Medical Advance Directive?: No Would patient like information on creating a medical advance directive?: No - Patient declined  Reviewed/Updated  Reviewed/Updated: Reviewed All (Medical, Surgical, Family, Medications, Allergies, Care Teams, Patient Goals)    Allergies (verified) Augmentin [amoxicillin-pot clavulanate] and Crestor  [rosuvastatin ]   Current Medications (verified) No outpatient encounter medications on file as of 12/04/2024.   No facility-administered encounter medications on file as of 12/04/2024.    History: Past Medical History:  Diagnosis Date   Allergic rhinitis    Anxiety    Arthritis    Colon polyp    GERD (gastroesophageal reflux disease)  Hemorrhoids    Hyperlipidemia    IBS (irritable bowel syndrome)    Impotence    Kidney stone    Orthostatic hypotension    Osteopenia    PVD (peripheral vascular disease)    Urinary obstruction    Past Surgical History:  Procedure Laterality Date   COLONOSCOPY  2007   HERNIA REPAIR  2005   TOTAL HIP ARTHROPLASTY Right 06/10/2020   Procedure: TOTAL HIP ARTHROPLASTY ANTERIOR APPROACH;  Surgeon: Fidel Rogue, MD;  Location: [facility];  Service: Orthopedics;  Laterality: Right;   Family History  Problem Relation Age of Onset   Prostate cancer Brother    Colon cancer Neg Hx    Colon polyps Neg Hx    Diabetes Neg Hx    Kidney disease Neg Hx    Esophageal cancer Neg Hx    Gallbladder disease Neg Hx    Heart disease Neg Hx    Social History   Occupational History   Occupation: Retired  Tobacco Use   Smoking status: Never   Smokeless tobacco: Never  Vaping Use   Vaping status: Never Used  Substance and Sexual Activity   Alcohol  use: Yes    Alcohol /week: 0.0 standard drinks of alcohol     Comment: 1 glass of wine a day   Drug use: No   Sexual activity: Never   Tobacco Counseling Counseling given: No  SDOH Screenings   Food Insecurity: No Food Insecurity (12/04/2024)  Housing: Low Risk (12/04/2024)  Transportation Needs: No Transportation Needs (12/04/2024)  Utilities: Not At Risk (12/04/2024)  Alcohol  Screen: Low Risk (11/29/2023)  Depression (PHQ2-9): Low Risk (12/04/2024)  Financial Resource Strain: Low Risk (11/29/2023)  Physical Activity: Insufficiently Active (12/04/2024)  Social Connections: Moderately Integrated (12/04/2024)  Stress: No Stress Concern Present (12/04/2024)  Tobacco Use: Low Risk (12/04/2024)  Health Literacy: Adequate Health Literacy (12/04/2024)   See flowsheets for full screening details  Depression Screen PHQ 2 & 9 Depression Scale- Over the past 2 weeks, how often have you been bothered by any of the following problems? Little interest or  pleasure in doing things: 0 Feeling down, depressed, or hopeless (PHQ Adolescent also includes...irritable): 0 PHQ-2 Total Score: 0 Trouble falling or staying asleep, or sleeping too much: 0 Feeling tired or having little energy: 0 Poor appetite or overeating (PHQ Adolescent also includes...weight loss): 0 Feeling bad about yourself - or that you are a failure or have let yourself or your family down: 0 Trouble concentrating on things, such as reading the newspaper or watching television (PHQ Adolescent also includes...like school work): 0 Moving or speaking so slowly that other people could have noticed. Or the opposite - being so fidgety or restless that you have been moving around a lot more than usual: 0 Thoughts that you would be better off dead, or of hurting yourself in some way: 0 PHQ-9 Total Score: 0 If you checked off any problems, how difficult have these problems made it for you to do your work, take care of things at home, or get along with other people?: Not difficult at all     Goals Addressed               This Visit's Progress     Remain active (pt-stated)               Objective:    Today's Vitals   12/04/24 1053  Weight: 174 lb (78.9 kg)  Height: 5' 8 (1.727 m)   Body mass index is 26.46  kg/m.  Hearing/Vision screen Hearing Screening - Comments:: Wears Hearing Aids Vision Screening - Comments:: Wears rx glasses - up to date with routine eye exams with Dr Octavia Immunizations and Health Maintenance Health Maintenance  Topic Date Due   Zoster Vaccines- Shingrix (1 of 2) Never done   Influenza Vaccine  06/08/2024   COVID-19 Vaccine (5 - 2025-26 season) 07/09/2024   Medicare Annual Wellness (AWV)  12/04/2025   DTaP/Tdap/Td (3 - Td or Tdap) 06/09/2032   Pneumococcal Vaccine: 50+ Years  Completed   Meningococcal B Vaccine  Aged Out        Assessment/Plan:  This is a routine wellness examination for Bill Price.  Patient Care Team: Copland, Harlene BROCKS, MD as PCP - General (Family Medicine)  I have personally reviewed and noted the following in the patients chart:   Medical and social history Use of alcohol , tobacco or illicit drugs  Current medications and supplements including opioid prescriptions. Functional ability and status Nutritional status Physical activity Advanced directives List of other physicians Hospitalizations, surgeries, and ER visits in previous 12 months Vitals Screenings to include cognitive, depression, and falls Referrals and appointments  No orders of the defined types were placed in this encounter.  In addition, I have reviewed and discussed with patient certain preventive protocols, quality metrics, and best practice recommendations. A written personalized care plan for preventive services as well as general preventive health recommendations were provided to patient.   Bill LELON Blush, LPN   8/72/7973   Return in 53 weeks (on 12/10/2025).  After Visit Summary: (MyChart) Due to this being a telephonic visit, the after visit summary with patients personalized plan was offered to patient via MyChart   Nurse Notes: No voiced or noted concerns at this time "

## 2025-01-30 ENCOUNTER — Ambulatory Visit: Admitting: Family Medicine

## 2025-12-10 ENCOUNTER — Ambulatory Visit
